# Patient Record
Sex: Male | Born: 1935 | Race: Asian | Hispanic: No | Marital: Married | Smoking: Never smoker
Health system: Southern US, Community
[De-identification: ages and names within clinical notes are randomized; demographics above are authoritative.]

## PROBLEM LIST (undated history)

## (undated) DIAGNOSIS — I2510 Atherosclerotic heart disease of native coronary artery without angina pectoris: Secondary | ICD-10-CM

## (undated) DIAGNOSIS — I499 Cardiac arrhythmia, unspecified: Secondary | ICD-10-CM

## (undated) DIAGNOSIS — I10 Essential (primary) hypertension: Secondary | ICD-10-CM

## (undated) DIAGNOSIS — G4730 Sleep apnea, unspecified: Secondary | ICD-10-CM

## (undated) DIAGNOSIS — I471 Supraventricular tachycardia, unspecified: Secondary | ICD-10-CM

## (undated) DIAGNOSIS — E7800 Pure hypercholesterolemia, unspecified: Secondary | ICD-10-CM

## (undated) DIAGNOSIS — K219 Gastro-esophageal reflux disease without esophagitis: Secondary | ICD-10-CM

## (undated) DIAGNOSIS — C61 Malignant neoplasm of prostate: Secondary | ICD-10-CM

## (undated) HISTORY — DX: Malignant neoplasm of prostate: C61

## (undated) HISTORY — DX: Atherosclerotic heart disease of native coronary artery without angina pectoris: I25.10

## (undated) HISTORY — PX: ANAL FISSURE REPAIR: SHX2312

## (undated) HISTORY — DX: Pure hypercholesterolemia, unspecified: E78.00

## (undated) HISTORY — PX: PROSTATECTOMY: SHX69

## (undated) HISTORY — DX: Essential (primary) hypertension: I10

---

## 1999-04-21 ENCOUNTER — Ambulatory Visit: Admission: RE | Admit: 1999-04-21 | Discharge: 1999-04-21 | Payer: Self-pay | Admitting: *Deleted

## 2002-04-23 ENCOUNTER — Ambulatory Visit (HOSPITAL_COMMUNITY): Admission: RE | Admit: 2002-04-23 | Discharge: 2002-04-23 | Payer: Self-pay | Admitting: Gastroenterology

## 2002-04-23 ENCOUNTER — Encounter: Payer: Self-pay | Admitting: Gastroenterology

## 2002-07-14 ENCOUNTER — Encounter (INDEPENDENT_AMBULATORY_CARE_PROVIDER_SITE_OTHER): Payer: Self-pay | Admitting: Specialist

## 2002-07-14 ENCOUNTER — Ambulatory Visit (HOSPITAL_COMMUNITY): Admission: RE | Admit: 2002-07-14 | Discharge: 2002-07-14 | Payer: Self-pay | Admitting: Gastroenterology

## 2003-03-03 ENCOUNTER — Encounter: Admission: RE | Admit: 2003-03-03 | Discharge: 2003-04-19 | Payer: Self-pay | Admitting: *Deleted

## 2009-09-17 HISTORY — PX: EYE SURGERY: SHX253

## 2009-11-07 ENCOUNTER — Encounter: Payer: Self-pay | Admitting: Internal Medicine

## 2009-11-18 ENCOUNTER — Encounter: Payer: Self-pay | Admitting: Internal Medicine

## 2009-12-12 ENCOUNTER — Ambulatory Visit: Payer: Self-pay | Admitting: Internal Medicine

## 2009-12-12 DIAGNOSIS — G4733 Obstructive sleep apnea (adult) (pediatric): Secondary | ICD-10-CM

## 2009-12-12 DIAGNOSIS — C61 Malignant neoplasm of prostate: Secondary | ICD-10-CM

## 2009-12-12 DIAGNOSIS — I471 Supraventricular tachycardia: Secondary | ICD-10-CM

## 2009-12-12 DIAGNOSIS — I10 Essential (primary) hypertension: Secondary | ICD-10-CM | POA: Insufficient documentation

## 2009-12-12 DIAGNOSIS — E7800 Pure hypercholesterolemia, unspecified: Secondary | ICD-10-CM | POA: Insufficient documentation

## 2010-04-11 ENCOUNTER — Encounter (INDEPENDENT_AMBULATORY_CARE_PROVIDER_SITE_OTHER): Payer: Self-pay | Admitting: *Deleted

## 2010-04-13 ENCOUNTER — Ambulatory Visit: Payer: Self-pay | Admitting: Internal Medicine

## 2010-04-13 DIAGNOSIS — R079 Chest pain, unspecified: Secondary | ICD-10-CM | POA: Insufficient documentation

## 2010-04-25 ENCOUNTER — Telehealth (INDEPENDENT_AMBULATORY_CARE_PROVIDER_SITE_OTHER): Payer: Self-pay | Admitting: *Deleted

## 2010-04-26 ENCOUNTER — Ambulatory Visit: Payer: Self-pay | Admitting: Cardiology

## 2010-04-26 ENCOUNTER — Encounter: Payer: Self-pay | Admitting: Cardiology

## 2010-04-26 ENCOUNTER — Ambulatory Visit: Payer: Self-pay

## 2010-04-26 ENCOUNTER — Encounter (HOSPITAL_COMMUNITY): Admission: RE | Admit: 2010-04-26 | Discharge: 2010-06-06 | Payer: Self-pay | Admitting: Internal Medicine

## 2010-05-02 ENCOUNTER — Telehealth: Payer: Self-pay | Admitting: Internal Medicine

## 2010-06-22 ENCOUNTER — Telehealth: Payer: Self-pay | Admitting: Internal Medicine

## 2010-08-17 ENCOUNTER — Encounter: Payer: Self-pay | Admitting: Internal Medicine

## 2010-10-19 NOTE — Progress Notes (Signed)
Summary: Nuclear Pre-Procedure  Phone Note Outgoing Call Call back at Home Phone [phone]   Call placed by: Stanton Kidney, EMT-P,  April 25, 2010 2:05 PM Action Taken: Phone Call Completed Summary of Call: Reviewed information on Myoview Information Sheet (see scanned document for further details).  Spoke with the Patient's wife.    Nuclear Med Background Indications for Stress Test: Evaluation for Ischemia     Symptoms: Chest Pain, Nausea, Palpitations, SOB    Nuclear Pre-Procedure Cardiac Risk Factors: Family History - CAD, Hypertension, Lipids Height (in): 68.5

## 2010-10-19 NOTE — Miscellaneous (Signed)
Summary: update med  Clinical Lists Changes  Medications: Changed medication from AMLODIPINE BESYLATE 5 MG TABS (AMLODIPINE BESYLATE) take one tablet once daily to AMLODIPINE BESYLATE 10 MG TABS (AMLODIPINE BESYLATE) Take one tablet by mouth daily

## 2010-10-19 NOTE — Consult Note (Signed)
Summary: [name]   Imported By: Marylou Mccoy 02/16/2010 16:04:31  _____________________________________________________________________  External Attachment:    Type:   Image     Comment:   External Document

## 2010-10-19 NOTE — Progress Notes (Signed)
Summary: refill request  Phone Note Refill Request Message from:  Patient on June 22, 2010 9:37 AM  Refills Requested: Medication #1:  AMLODIPINE BESYLATE 10 MG TABS 1/2 tablet once daily  Medication #2:  TOPROL XL 50 MG XR24H-TAB take one tablet once daily [facility]   Method Requested: Telephone to Pharmacy Initial call taken by: Glynda Jaeger,  June 22, 2010 9:37 AM  Follow-up for Phone Call       Follow-up by: Judithe Modest CMA,  June 22, 2010 11:18 AM    Prescriptions: TOPROL XL 50 MG XR24H-TAB (METOPROLOL SUCCINATE) take one tablet once daily  #30 x 3   Entered by:   Judithe Modest CMA   Authorized by:   Nathen May, MD, [facility]   Signed by:   Judithe Modest CMA on 06/22/2010   Method used:   Electronically to        [facility]  [phone]* (retail)       [address]       [place], [place]  [address]       Ph: [phone] or [phone]       Fax: [phone]   RxID:   [id number] AMLODIPINE BESYLATE 10 MG TABS (AMLODIPINE BESYLATE) 1/2 tablet once daily  #15 x 3   Entered by:   Judithe Modest CMA   Authorized by:   Nathen May, MD, [facility]   Signed by:   Judithe Modest CMA on 06/22/2010   Method used:   Electronically to        [facility]  [phone]* (retail)       [address]       [place], [place]  [address]       Ph: [phone] or [phone]       Fax: [phone]   RxID:   [id number]

## 2010-10-19 NOTE — Assessment & Plan Note (Signed)
Summary: nep/svt ablation? AVNRT/jml   Referring Provider:  Donato Schultz, MD Primary Provider:  Juluis Rainier, MD  CC:  new patient/question of SVT ablation.  AVNRT.  Pt referred by Dr. Dione Housekeeper.  History of Present Illness: The patient is seen at the request of Dr Anne Fu for recurrent symptomatic tachypalpiations.    He is a 77 year old retired [employer] fund[employer] who has a one to 2 year history of abrupt onset onset tachypalpitations. His measured heart rate with these range from 1:30 to 150 beats per minute there from positive a diuretic negative they're unassociated with chest pain shortness of breath or lightheadedness.  He notes no specific provocative agents.  Current Medications (verified): 1)  Lansoprazole 15 Mg Tbdp (Lansoprazole) .... Take One Tablet Once Daily 2)  Amlodipine Besylate 5 Mg Tabs (Amlodipine Besylate) .... Take One Tablet Once Daily 3)  Toprol Xl 50 Mg Xr24h-Tab (Metoprolol Succinate) .... Take One Tablet Once Daily 4)  Crestor 10 Mg Tabs (Rosuvastatin Calcium) .... Take One Tablet Once Daily 5)  Aspir-Low 81 Mg Tbec (Aspirin) .... Take One Tablet Once Daily 6)  Beta Carotene 16109 Unit Caps (Beta Carotene) .... Take One Capsule Daily 7)  B-100 Complex  Tabs (Vitamins-Lipotropics) .... Take One Tablet Once Daily 8)  Vitamin E 400 Unit Caps (Vitamin E) .... Once Daily 9)  Calcium-Vitamin D3 600-200 Mg-Unit Tabs (Calcium Carbonate-Vitamin D) .... Once Daily 10)  Zinc Gluconate 50 Mg Tabs (Zinc Gluconate) .... Once Daily 11)  Magnesium Oxide 400 Mg Tabs (Magnesium Oxide) .... Take One Tablet Once Daily 12)  Folic Acid 800 Mcg Tabs (Folic Acid) .... Once Daily 13)  L-Arginine 500 Mg Caps (Arginine) .... Once Daily 14)  Fish Oil Maximum Strength 1200 Mg Caps (Omega-3 Fatty Acids) .... 2 Capsules Daily 15)  Papaya Enzyme  Tabs (Digestive Enzymes) .... Once Daily 16)  Flax Seed Oil 1000 Mg Caps (Flaxseed (Linseed)) .... Once Daily 17)  Align  Caps (Probiotic Product) .... Once  Daily  Allergies (verified): 1)  ! Amoxicillin 2)  ! * Mycins 3)  ! Ace Inhibitors 4)  ! Lipitor (Atorvastatin) 5)  ! Ibuprofen 6)  ! Pcn  Past History:  Past Surgical History: Last updated: 12/12/2009 prostatectomy  Family History: Last updated: 12/12/2009 Negative FH of Diabetes, Hypertension, or Coronary Artery Disease  Social History: Last updated: 12/12/2009 Full Time Tobacco Use - No.  Alcohol Use - no Regular Exercise - yes married with 4 children and retired  Past Medical History: prostate cancer status post prostatectomy Palpitations Hypertension Hypercholesterolemia  Family History: Negative FH of Diabetes, Hypertension, or Coronary Artery Disease  Social History: Full Time Tobacco Use - No.  Alcohol Use - no Regular Exercise - yes married with 4 children and retired  Review of Systems       full review of systems was negative apart from a history of present illness and past medical history.   Vital Signs:  Patient profile:   77 year old male Height:      68.5 inches Weight:      161 pounds BMI:     24.21 Pulse rate:   55 / minute Pulse rhythm:   regular BP sitting:   164 / 84  (left arm) Cuff size:   regular  Vitals Entered By: Judithe Modest CMA (December 12, 2009 1:29 PM)  Physical Exam  General:  Well developed, well nourished, in no acute distress. Head:  normal HEENT except for dysconjugate gaze with left eye amblyopia Neck:  flat neck  veins supple neck brisk carotids Chest Wall:  without CVA tenderness Lungs:  clear to auscultation Heart:  regular rate and rhythm with a 2/6 murmur heard broadly across the precordium Abdomen:  soft with active bowel sounds without hepatomegaly or midline pulsation Msk:  normal Pulses:  pulses normal in all 4 extremities Extremities:  No clubbing or cyanosis. Neurologic:  gross and normal apart from the left eye issue Skin:  skin warm and dry Cervical Nodes:  no adenopathy Psych:   indication   EKG  Procedure date:  12/12/2009  Findings:      sinus rhythm at 55 Intervals 0.21/0.10/0.43 Axis -65 R. prime in lead V1 Q Waves in lead V1 and V2  Impression & Recommendations:  Problem # 1:  PALPITATIONS (ICD-785.1) Pt has junctional rhythm with varying rates. It is not clear as noted before whether there is antecedent triggering P-wave or not it was interpreted as junction rhythms or AV node reentry. I suspect it is the latter. We have discussed the potential treatment strategies including ongoing use of beta blockers although this may make it worse and catheter ablation. We discussed the potential benefits as well as potential risks including but not limited to death perforation and heart block. He understands these risks at this point he would like to continue doing what he is doing. I did reassure him that this does not represent a life-threatening rhythm. We'll see him in 3 months His updated medication list for this problem includes:    Amlodipine Besylate 5 Mg Tabs (Amlodipine besylate) .Marland Kitchen... Take one tablet once daily    Toprol Xl 50 Mg Xr24h-tab (Metoprolol succinate) .Marland Kitchen... Take one tablet once daily    Aspir-low 81 Mg Tbec (Aspirin) .Marland Kitchen... Take one tablet once daily  Orders: EKG w/ Interpretation (93000)  Problem # 2:  ESSENTIAL HYPERTENSION, BENIGN (ICD-401.1) his blood pressure remains elevated he will plan to increase his amlodipine from 5 mg to 10 mg a day. His updated medication list for this problem includes:    Amlodipine Besylate 5 Mg Tabs (Amlodipine besylate) .Marland Kitchen... Take one tablet once daily    Toprol Xl 50 Mg Xr24h-tab (Metoprolol succinate) .Marland Kitchen... Take one tablet once daily    Aspir-low 81 Mg Tbec (Aspirin) .Marland Kitchen... Take one tablet once daily  Patient Instructions: 1)  Your physician has recommended you make the following change in your medication: Increase Norvasc to 10mg  daily 2)  Your physician recommends that you schedule a follow-up appointment  in: 3-4 months 3)  You have been diagnosed with SVT (supraventricular tachycardia).  SVT is a rapid heartbeat that begins in the upper chambers of the heart. This is usually not a life threatening condition. Please see the handout/brochure given to you today for more information.

## 2010-10-19 NOTE — Assessment & Plan Note (Signed)
Summary: Cardiology Nuclear Testing  Nuclear Med Background Indications for Stress Test: Evaluation for Ischemia   History: GXT  History Comments: GXT 1 yr.ago: Ok per patient [facility] cardiology).  Symptoms: Chest Pain, Chest Pain with Exertion, Light-Headedness, Nausea, Palpitations, Rapid HR, SOB    Nuclear Pre-Procedure Cardiac Risk Factors: Family History - CAD, Hypertension, Lipids Caffeine/Decaff Intake: None NPO After: 8:00 PM Lungs: Clear IV 0.9% NS with Angio Cath: 20g     IV Site: (L) AC IV Started by: Irean Hong RN Chest Size (in) 40     Height (in): 68.5 Weight (lb): 155 BMI: 23.31 Tech Comments: Held toprol 24 hrs.  Nuclear Med Study 1 or 2 day study:  1 day     Stress Test Type:  Stress Reading MD:  Marca Ancona, MD     Referring MD:  Odessa Fleming Resting Radionuclide:  Technetium 86m Tetrofosmin     Resting Radionuclide Dose:  10.8 mCi  Stress Radionuclide:  Technetium 25m Tetrofosmin     Stress Radionuclide Dose:  33.0 mCi   Stress Protocol Exercise Time (min):  7:16 min     Max HR:  142 bpm     Predicted Max HR:  146 bpm  Max Systolic BP: 172 mm Hg     Percent Max HR:  97.26 %     METS: 8.9 Rate Pressure Product:  36644    Stress Test Technologist:  Irean Hong RN     Nuclear Technologist:  Harlow Asa CNMT  Rest Procedure  Myocardial perfusion imaging was performed at rest 45 minutes following the intravenous administration of Myoview Technetium 42m Tetrofosmin.  Stress Procedure  The patient exercised for 7 minutes and 16 seconds, RPE=15.  The patient stopped due to palpitations, DOE   and denied any chest pain.  There were no significant ST-T wave changes, frequent PJC's.  Myoview was injected at peak exercise and myocardial perfusion imaging was performed after a brief delay.  QPS Raw Data Images:  Normal; no motion artifact; normal heart/lung ratio. Stress Images:  There is normal uptake in all areas. Rest Images:  Normal homogeneous uptake  in all areas of the myocardium. Subtraction (SDS):  There is no evidence of scar or ischemia. Transient Ischemic Dilatation:  1.02  (Normal <1.22)  Lung/Heart Ratio:  .30  (Normal <0.45)  Quantitative Gated Spect Images QGS EDV:  83 ml QGS ESV:  19 ml QGS EF:  77 % QGS cine images:  Normal wall motion.    Overall Impression  Exercise Capacity: Good exercise capacity. BP Response: Normal blood pressure response. Clinical Symptoms: Mild dyspnea, calf tightness.  ECG Impression: No significant ST segment change suggestive of ischemia. Overall Impression: Normal stress nuclear study.  Appended Document: Cardiology Nuclear Testing Called patient with results.

## 2010-10-19 NOTE — Progress Notes (Signed)
Summary: test result  Phone Note Call from Patient Call back at Home Phone [phone]   Caller: Patient Reason for Call: Talk to Nurse, Lab or Test Results Initial call taken by: Lorne Skeens,  May 02, 2010 11:26 AM  Follow-up for Phone Call        Called patient with nuc med stress test results. Follow-up by: Suzan Garibaldi RN

## 2010-10-19 NOTE — Assessment & Plan Note (Signed)
Summary: 3 month rov.sl   Referring Provider:  Donato Schultz, MD Primary Provider:  Juluis Rainier, MD  CC:  3 month rov.  Pt states he is doing fairly well.  He may have one or two episodes in a month or more.  Marland Kitchen  History of Present Illness: M r  Bice IS SEEN IN FOLLOWUP FOR A JUNCTIONAL RHYTHM THAT WE THOUGHT WAS LIKELY AV NODE REENTRY.    His head number of episodes since we saw him 3 months ago. He said lasted 5 or 6 minutes or so. They're associated with a post event diuresis. They are more frightening than anything else.  He also describes a left shoulder discomfort with some radiation into his arm. It is associated chiefly with effort such as carrying. It does not occur while he is on the treadmill. It is unassociated with shortness of breath or nausea. It typically lasts 15-60 minutes afterwards and then resolves.  Cardiac risk factors are notable for hypertension dyslipidemia. His father said to have died of a heart attack in [place] although this was a post mortem diagnosis without autopsy  Current Medications (verified): 1)  Lansoprazole 15 Mg Tbdp (Lansoprazole) .... Take One Tablet Once Daily 2)  Amlodipine Besylate 10 Mg Tabs (Amlodipine Besylate) .... 1/2 Tablet Once Daily 3)  Toprol Xl 50 Mg Xr24h-Tab (Metoprolol Succinate) .... Take One Tablet Once Daily 4)  Crestor 10 Mg Tabs (Rosuvastatin Calcium) .... Take One Tablet Once Daily 5)  Aspir-Low 81 Mg Tbec (Aspirin) .... Take One Tablet Once Daily 6)  Beta Carotene 16109 Unit Caps (Beta Carotene) .... Take One Capsule Daily 7)  B-100 Complex  Tabs (Vitamins-Lipotropics) .... Take One Tablet Once Daily 8)  Vitamin E 400 Unit Caps (Vitamin E) .... Once Daily 9)  Calcium-Vitamin D3 600-200 Mg-Unit Tabs (Calcium Carbonate-Vitamin D) .... Once Daily 10)  Zinc Gluconate 50 Mg Tabs (Zinc Gluconate) .... Once Daily 11)  Magnesium Oxide 400 Mg Tabs (Magnesium Oxide) .... Take One Tablet Once Daily 12)  Folic Acid 800 Mcg Tabs (Folic  Acid) .... Once Daily 13)  L-Arginine 500 Mg Caps (Arginine) .... Once Daily 14)  Fish Oil Maximum Strength 1200 Mg Caps (Omega-3 Fatty Acids) .Marland Kitchen.. 1 Capsule Daily 15)  Papaya Enzyme  Tabs (Digestive Enzymes) .... Once Daily 16)  Flax Seed Oil 1000 Mg Caps (Flaxseed (Linseed)) .... Once Daily 17)  Align  Caps (Probiotic Product) .... Once Daily  Allergies (verified): 1)  ! Amoxicillin 2)  ! * Mycins 3)  ! Ace Inhibitors 4)  ! Lipitor (Atorvastatin) 5)  ! Ibuprofen 6)  ! Pcn  Past History:  Past Medical History: Last updated: 12/12/2009 prostate cancer status post prostatectomy Palpitations Hypertension Hypercholesterolemia  Past Surgical History: Last updated: 12/12/2009 prostatectomy  Family History: Last updated: 12/12/2009 Negative FH of Diabetes, Hypertension, or Coronary Artery Disease  Social History: Last updated: 12/12/2009 Full Time Tobacco Use - No.  Alcohol Use - no Regular Exercise - yes married with 4 children and retired  Vital Signs:  Patient profile:   77 year old male Height:      68.5 inches Weight:      158 pounds BMI:     23.76 Pulse rate:   61 / minute Pulse rhythm:   regular BP sitting:   150 / 75  (left arm) Cuff size:   regular  Vitals Entered By: Judithe Modest CMA (April 13, 2010 2:28 PM)  Physical Exam  General:  Well developed, well nourished, in no acute  distress. Head:  normal HEENT except for dysconjugate gaze with left eye amblyopia Eyes:    Neck:  flat neck veins supple neck brisk carotids Lungs:  clear to auscultation Heart:  regular rate and rhythm with a 2/6 murmur heard broadly across the precordium; mid to late peaking with a split S2 Abdomen:  soft with active bowel sounds without hepatomegaly or midline pulsation Msk:  normal Extremities:  No clubbing or cyanosis.3: No Neurologic:  gross and normal apart from the left eye issue   EKG  Procedure date:  04/13/2010  Findings:      sinus rhythm at 59 Intervals  0.21 5.10/0.42 Axis is -64 Poor R wave progression leads V1 and V2  Impression & Recommendations:  Problem # 1:  CHEST PAIN UNSPECIFIED (ICD-786.50) Mr. Theiss has atypical chest pain with concerning risk factors. 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 Will plan given his we'll symptoms to continue him on his current course. Because of the issue of junctional rhythm and sinus node dysfunction we have elected not to put him on a heart rate slowing calcium blocker His updated medication list for this problem includes:    Amlodipine Besylate 10 Mg Tabs (Amlodipine besylate) .Marland Kitchen... 1/2 tablet once daily    Toprol Xl 50 Mg Xr24h-tab (Metoprolol succinate) .Marland Kitchen... Take one tablet once daily    Aspir-low 81 Mg Tbec (Aspirin) .Marland Kitchen... Take one tablet once daily  Patient Instructions: 1)  Your physician has requested that you have an exercise stress myoview.  For further information please visit [email or web address]  Please follow instruction sheet, as given.

## 2011-02-02 NOTE — Op Note (Signed)
Dalton Cardenas, Dalton Cardenas                          ACCOUNT NO.:  [id number]   MEDICAL RECORD NO.:  [id number]                   PATIENT TYPE:  AMB   LOCATION:  ENDO                                 FACILITY:  [facility]   PHYSICIAN:  Anselmo Rod, M.D.               DATE OF BIRTH:  [birth year]   DATE OF PROCEDURE:  07/14/2002  DATE OF DISCHARGE:                                 OPERATIVE REPORT   DATE OF BIRTH:  01/06/1934   PROCEDURE PERFORMED:  Colonoscopy with snare polypectomy x 2.   ENDOSCOPIST:  Charna Elizabeth, M.D.   INSTRUMENT USED:  Olympus video colonoscope.   INDICATIONS FOR PROCEDURE:  A 77 year old male undergoing screening  colonoscopy.  Rule out colonic polyps, masses, etc.   PREPROCEDURE PREPARATION:  Informed consent was procured from the patient.  The patient was fasted for eight hours prior to the procedure and was  prepped with a bottle of magnesium citrate and a gallon of NuLytely the  night prior to the procedure.  The patient received 400 mg of Cipro IV for  MVP prophylaxis.   PREPROCEDURE PHYSICAL:   GENERAL:  The patient has stable vital signs.   NECK:  Supple.   CHEST:  Clear to auscultation.  S1, S2 regular.   ABDOMEN:  Soft with normal bowel sounds.   DESCRIPTION OF PROCEDURE:  The patient was placed in the left lateral  decubitus position and sedated with 70 mg of Demerol and 7 mg of Versed  intravenously.  Once the patient was adequately sedated and maintained on  low-flow oxygen and continuous cardiac monitoring, the Olympus video  colonoscope was advanced from the rectum to the cecum and terminal ileum  without difficulty.  The patient had an excellent prep.  A small sessile  polyp was snared from 55 cm and another small sessile polyp was snared from  10 cm.  These were both removed by a cold snare.  No other abnormalities  were noted up to the terminal ileum.  The appendiceal orifice and ileocecal  valve were clearly visualized and photographed.   Small internal hemorrhoids  were seen on retroflexion.   IMPRESSION:  1. Small nonbleeding internal hemorrhoid.  2. Small sessile polyp removed by cold snared from 10 cm, another small     polyp removed by cold snare from 55 cm.  3. Normal-appearing transverse colon, right colon, cecum, and terminal     ileum.   RECOMMENDATIONS:  1. Await pathology results.  2. Avoid all nonsteroidals, including aspirin for the next few weeks.  3. Liberal fluid and fiber intake.  4. Outpatient follow-up in the next two weeks or earlier if need be.  Anselmo Rod, M.D.    JNM/MEDQ  D:  07/14/2002  T:  07/14/2002  Job:  [id number]   cc:   Al Decant. Janey Greaser, M.D.  [address]  [place]  [place] [address]  Fax: [phone]

## 2011-03-23 ENCOUNTER — Encounter: Payer: Self-pay | Admitting: Internal Medicine

## 2011-07-03 ENCOUNTER — Ambulatory Visit: Payer: Self-pay | Admitting: Internal Medicine

## 2011-07-20 ENCOUNTER — Telehealth: Payer: Self-pay | Admitting: Internal Medicine

## 2011-07-20 NOTE — Telephone Encounter (Signed)
New message Pt called and he has not been feeling good. He said he is having a woozy feeling? Please call

## 2011-07-20 NOTE — Telephone Encounter (Signed)
I spoke with the patient. He states that he has been out of the country in [place] and that is why he missed his last office visit with Dr. Graciela Husbands. He states while he was there, he did have a cath that showed a 60% lesion to the left circumflex. He has proceeded to have feelings of being "whoozy," but he has not felt syncopal or pre-syncopal. He is being scheduled to followup with Dr. Graciela Husbands on 11/30 at 11:45am.

## 2011-08-17 ENCOUNTER — Ambulatory Visit: Payer: Self-pay | Admitting: Internal Medicine

## 2011-09-05 ENCOUNTER — Encounter: Payer: Self-pay | Admitting: Internal Medicine

## 2011-09-05 ENCOUNTER — Ambulatory Visit (INDEPENDENT_AMBULATORY_CARE_PROVIDER_SITE_OTHER): Payer: Medicare Other | Admitting: Internal Medicine

## 2011-09-05 VITALS — BP 166/82 | HR 69 | Ht 69.0 in | Wt 164.8 lb

## 2011-09-05 DIAGNOSIS — R079 Chest pain, unspecified: Secondary | ICD-10-CM

## 2011-09-05 DIAGNOSIS — E7800 Pure hypercholesterolemia, unspecified: Secondary | ICD-10-CM

## 2011-09-05 DIAGNOSIS — I2510 Atherosclerotic heart disease of native coronary artery without angina pectoris: Secondary | ICD-10-CM

## 2011-09-05 DIAGNOSIS — R002 Palpitations: Secondary | ICD-10-CM

## 2011-09-05 NOTE — Patient Instructions (Signed)
Your physician wants you to follow-up in: 6 months  You will receive a reminder letter in the mail two months in advance. If you don't receive a letter, please call our office to schedule the follow-up appointment.  Your physician recommends that you continue on your current medications as directed. Please refer to the Current Medication list given to you today.  

## 2011-09-05 NOTE — Progress Notes (Signed)
HPI  Dalton Cardenas is a 77 y.o. male Seen after a hiatus of a couple of years. He was in [place] last December. He had recurrent palpitations. Because of his age and the electrophysiologist suggested that he had a catheterization. This showed an eccentric proximal circumflex lesion. It was elected to treat it medically. It was also elected to treat his tachycardia medically. He underwent a Holter monitor that demonstrated resting bradycardia, a tachycardia that is consistent with AV node reentry, and another tachycardia that is consistent with multifocal atrial tachycardia. He was treated with a medication called ABANA Which is a cholesterol-lowering drug newlywed site for which I could find was afebrile and organization. In any case it had been associated with elimination of his tachycardia. He feels terrific and he is going back to [place]  Past Medical History  Diagnosis Date  . Prostate cancer     status post prostatectomy  . Palpitations   . Hypertension   . Hypercholesterolemia     Past Surgical History  Procedure Date  . Prostatectomy     Current Outpatient Prescriptions  Medication Sig Dispense Refill  . amLODipine (NORVASC) 10 MG tablet Take 10 mg by mouth daily.        . Arginine 500 MG CAPS Take 1 capsule by mouth daily.        Marland Kitchen aspirin 81 MG tablet Take 81 mg by mouth 2 (two) times daily.       . beta carotene 16109 UNIT capsule Take 25,000 Units by mouth daily.        . Calcium Carbonate-Vitamin D (CALCIUM-VITAMIN D3) 600-200 MG-UNIT TABS Take 1 tablet by mouth daily.        . Digestive Enzymes (PAPAYA ENZYME) TABS Take 1 tablet by mouth daily.        . Flaxseed, Linseed, (FLAX SEED OIL) 1000 MG CAPS Take 1 capsule by mouth daily.        . folic acid (FOLVITE) 800 MCG tablet Take 800 mcg by mouth daily.        . lansoprazole (PREVACID) 15 MG capsule Take 15 mg by mouth daily.        . magnesium oxide (MAG-OX) 400 MG tablet Take 400 mg by mouth daily.        . metoprolol  (TOPROL-XL) 50 MG 24 hr tablet Take 75 mg by mouth daily. 1/2 tab in the morning and whole tablet in the evening       . Omega-3 Fatty Acids (FISH OIL MAXIMUM STRENGTH) 1200 MG CAPS Take 1 capsule by mouth daily.        . Probiotic Product (ALIGN PO) Take by mouth daily.        . rosuvastatin (CRESTOR) 10 MG tablet Take 10 mg by mouth daily.        . Thiamine HCl (VITAMIN B-1) 100 MG tablet Take 100 mg by mouth daily.        . vitamin E 400 UNIT capsule Take 400 Units by mouth daily.        Marland Kitchen zinc gluconate 50 MG tablet Take 50 mg by mouth daily.          Allergies  Allergen Reactions  . Ace Inhibitors   . Amoxicillin   . Atorvastatin   . Ibuprofen   . Penicillins     Review of Systems negative except from HPI and PMH  Physical Exam Well developed and well nourished in no acute distress HENT normal E scleral and icterus clear;  esotropia  Neck Supple JVP flat; carotids brisk and full Clear to ausculation Regular rate and rhythm, 2/6 systolic murmur along the left upper sternal border going to the right upper border; S2 is well preserved Soft with active bowel sounds No clubbing cyanosis none Edema Alert and oriented, grossly normal motor and sensory function Skin Warm and Dry   Echo was reviewed from [place]. Showed aortic sclerosis Assessment and  Plan

## 2011-09-05 NOTE — Assessment & Plan Note (Signed)
I recommended a CT follow up with Dr. Zachery Dauer concerning the possible addition of resin therapy

## 2011-09-05 NOTE — Assessment & Plan Note (Signed)
The patient has recurrent supraventricular tachycardia palpitations. Medical therapy has worked. 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 This anticholesterol medication calledABANA seems to be having a therapeutic effect. We'll continue him on his current regime.

## 2012-07-22 ENCOUNTER — Emergency Department (HOSPITAL_COMMUNITY)
Admission: EM | Admit: 2012-07-22 | Discharge: 2012-07-22 | Disposition: A | Discharge status: Home / Self Care | Payer: Medicare Other | Attending: Emergency Medicine | Admitting: Emergency Medicine

## 2012-07-22 ENCOUNTER — Emergency Department (HOSPITAL_COMMUNITY): Payer: Medicare Other

## 2012-07-22 ENCOUNTER — Encounter (HOSPITAL_COMMUNITY): Payer: Self-pay | Admitting: *Deleted

## 2012-07-22 DIAGNOSIS — Z79899 Other long term (current) drug therapy: Secondary | ICD-10-CM | POA: Insufficient documentation

## 2012-07-22 DIAGNOSIS — I2510 Atherosclerotic heart disease of native coronary artery without angina pectoris: Secondary | ICD-10-CM | POA: Insufficient documentation

## 2012-07-22 DIAGNOSIS — E876 Hypokalemia: Secondary | ICD-10-CM

## 2012-07-22 DIAGNOSIS — Z87898 Personal history of other specified conditions: Secondary | ICD-10-CM | POA: Insufficient documentation

## 2012-07-22 DIAGNOSIS — I10 Essential (primary) hypertension: Secondary | ICD-10-CM | POA: Insufficient documentation

## 2012-07-22 DIAGNOSIS — E7800 Pure hypercholesterolemia, unspecified: Secondary | ICD-10-CM | POA: Insufficient documentation

## 2012-07-22 DIAGNOSIS — Z7982 Long term (current) use of aspirin: Secondary | ICD-10-CM | POA: Insufficient documentation

## 2012-07-22 DIAGNOSIS — R55 Syncope and collapse: Secondary | ICD-10-CM | POA: Insufficient documentation

## 2012-07-22 DIAGNOSIS — Z8546 Personal history of malignant neoplasm of prostate: Secondary | ICD-10-CM | POA: Insufficient documentation

## 2012-07-22 DIAGNOSIS — I4891 Unspecified atrial fibrillation: Secondary | ICD-10-CM | POA: Insufficient documentation

## 2012-07-22 HISTORY — DX: Supraventricular tachycardia: I47.1

## 2012-07-22 HISTORY — DX: Supraventricular tachycardia, unspecified: I47.10

## 2012-07-22 LAB — CBC WITH DIFFERENTIAL/PLATELET
Basophils Absolute: 0.1 10*3/uL (ref 0.0–0.1)
Basophils Relative: 1 % (ref 0–1)
Eosinophils Absolute: 0.2 10*3/uL (ref 0.0–0.7)
HCT: 40.7 % (ref 39.0–52.0)
MCH: 30.2 pg (ref 26.0–34.0)
MCHC: 35.1 g/dL (ref 30.0–36.0)
Monocytes Absolute: 0.9 10*3/uL (ref 0.1–1.0)
Neutro Abs: 7.5 10*3/uL (ref 1.7–7.7)
Neutrophils Relative %: 73 % (ref 43–77)
RDW: 12.4 % (ref 11.5–15.5)

## 2012-07-22 LAB — BASIC METABOLIC PANEL
BUN: 11 mg/dL (ref 6–23)
Chloride: 98 mEq/L (ref 96–112)
Creatinine, Ser: 0.93 mg/dL (ref 0.50–1.35)
GFR calc Af Amer: >90 mL/min (ref >90)
GFR calc non Af Amer: 80 mL/min — ABNORMAL LOW (ref >90)

## 2012-07-22 LAB — URINALYSIS, ROUTINE W REFLEX MICROSCOPIC
Bilirubin Urine: NEGATIVE
Ketones, ur: NEGATIVE mg/dL
Leukocytes, UA: NEGATIVE
Nitrite: NEGATIVE
Protein, ur: NEGATIVE mg/dL
Urobilinogen, UA: 0.2 mg/dL (ref 0.0–1.0)
pH: 7 (ref 5.0–8.0)

## 2012-07-22 MED ORDER — POTASSIUM CHLORIDE CRYS ER 20 MEQ PO TBCR
40.0000 meq | EXTENDED_RELEASE_TABLET | Freq: Once | ORAL | Status: AC
  Administered 2012-07-22: 40 meq via ORAL
  Filled 2012-07-22: qty 2

## 2012-07-22 NOTE — ED Provider Notes (Signed)
History     CSN: [id number]  Arrival date & time 07/22/12  1026   First MD Initiated Contact with Patient 07/22/12 1027      Chief Complaint  Patient presents with  . Loss of Consciousness    (Consider location/radiation/quality/duration/timing/severity/associated sxs/prior treatment) HPI Pt with history of atrial fibrillation and tachycardia was at the gym doing typical cardio workout this AM when he began to feel lightheaded and dizzy. He was unconscious for a brief period. Regained consciousness spontaneously, no reported seizure activity. He denies any antecedent CP, SOB, nausea or vomiting. He admits to not eating breakfast prior to his workout but states this is typical for him. He is feeling better now, but still a little bit nauseated and generally weak.   Past Medical History  Diagnosis Date  . Prostate cancer     status post prostatectomy  . Palpitations   . Hypertension   . Hypercholesterolemia   . Coronary artery disease     60% circumflex-catheter [place] 2011  . Atrial fibrillation     Past Surgical History  Procedure Date  . Prostatectomy     Family History  Problem Relation Age of Onset  . Diabetes Neg Hx   . Hypertension Neg Hx   . Coronary artery disease Neg Hx     History  Substance Use Topics  . Smoking status: Never Smoker   . Smokeless tobacco: Never Used  . Alcohol Use: No      Review of Systems All other systems reviewed and are negative except as noted in HPI.   Allergies  Ace inhibitors; Amoxicillin; Atorvastatin; Azithromycin; Ibuprofen; and Penicillins  Home Medications   Current Outpatient Rx  Name  Route  Sig  Dispense  Refill  . AMLODIPINE BESYLATE 5 MG PO TABS   Oral   Take 5 mg by mouth daily.         . ASPIRIN 81 MG PO TABS   Oral   Take 81 mg by mouth daily.          . B COMPLEX PO TABS   Oral   Take 1 tablet by mouth daily.         Marland Kitchen BETA CAROTENE 72536 UNITS PO CAPS   Oral   Take 25,000 Units by mouth  daily.           Marland Kitchen CALCIUM-VITAMIN D3 600-200 MG-UNIT PO TABS   Oral   Take 1 tablet by mouth daily.           Marland Kitchen VITAMIN D 1000 UNITS PO TABS   Oral   Take 1,000 Units by mouth daily.         . CO Q-10 PO   Oral   Take 1 capsule by mouth daily.         . CYANOCOBALAMIN 1000 MCG PO TABS   Oral   Take 1,000 mcg by mouth daily.         Marland Kitchen FOLIC ACID 800 MCG PO TABS   Oral   Take 800 mcg by mouth daily.           Marland Kitchen MAGNESIUM OXIDE 400 MG PO TABS   Oral   Take 400 mg by mouth daily.           Marland Kitchen METOPROLOL SUCCINATE ER 50 MG PO TB24   Oral   Take 25-50 mg by mouth daily. Take 50mg  in the morning and 25mg  in the evening         .  PANTOPRAZOLE SODIUM 40 MG PO TBEC   Oral   Take 40 mg by mouth daily.         Marland Kitchen ALIGN PO   Oral   Take 1 capsule by mouth daily.          Marland Kitchen ROSUVASTATIN CALCIUM 10 MG PO TABS   Oral   Take 10 mg by mouth daily.           Marland Kitchen TAMSULOSIN HCL 0.4 MG PO CAPS   Oral   Take 0.4 mg by mouth daily after supper.         Marland Kitchen VITAMIN B-1 100 MG PO TABS   Oral   Take 100 mg by mouth daily.           Marland Kitchen VITAMIN C 500 MG PO TABS   Oral   Take 500 mg by mouth daily.         Marland Kitchen ZINC GLUCONATE 50 MG PO TABS   Oral   Take 50 mg by mouth every other day.            BP 129/62  Pulse 69  Temp 97.7 F (36.5 C) (Oral)  Resp 16  SpO2 99%  Physical Exam  Nursing note and vitals reviewed. Constitutional: He is oriented to person, place, and time. He appears well-developed and well-nourished.  HENT:  Head: Normocephalic and atraumatic.  Eyes: EOM are normal. Pupils are equal, round, and reactive to light.  Neck: Normal range of motion. Neck supple.  Cardiovascular: Normal rate, normal heart sounds and intact distal pulses.   Pulmonary/Chest: Effort normal and breath sounds normal.  Abdominal: Bowel sounds are normal. He exhibits no distension. There is no tenderness.  Musculoskeletal: Normal range of motion. He exhibits no  edema and no tenderness.  Neurological: He is alert and oriented to person, place, and time. He has normal strength. No cranial nerve deficit or sensory deficit.  Skin: Skin is warm and dry. No rash noted.  Psychiatric: He has a normal mood and affect.    ED Course  Procedures (including critical care time)   Labs Reviewed  CBC WITH DIFFERENTIAL  BASIC METABOLIC PANEL  URINALYSIS, ROUTINE W REFLEX MICROSCOPIC  TROPONIN I   Dg Chest 2 View  07/22/2012  *RADIOLOGY REPORT*  Clinical Data: Syncopal episode, history of hypertension  CHEST - 2 VIEW  Comparison: None.  Findings: The heart and pulmonary vascularity are within normal limits.  Lungs are clear bilaterally.  No acute bony abnormality is seen.  IMPRESSION: No acute abnormality noted.   Original Report Authenticated By: Alcide Clever, M.D.    Ct Head Wo Contrast  07/22/2012  *RADIOLOGY REPORT*  Clinical Data: Dizziness with near syncope  CT HEAD WITHOUT CONTRAST  Technique:  Contiguous axial images were obtained from the base of the skull through the vertex without contrast. Study was obtained within 24 hours of patient arrival at the emergency department.  Comparison: None.  Findings: There is mild diffuse atrophy.  There is no mass, hemorrhage, extra-axial fluid collection, midline shift.  There is minimal small vessel disease in the centra semiovale bilaterally. Gray-white compartments are otherwise normal.  There is no appreciable acute infarct.  Bony calvarium appears intact.  The mastoid air cells are clear.  IMPRESSION: Mild atrophy with minimal small vessel disease.  Study otherwise unremarkable.   Original Report Authenticated By: Bretta Bang, M.D.      No diagnosis found.    MDM   Date: 07/22/2012  Rate: 72  Rhythm: normal sinus rhythm  QRS Axis: normal  Intervals: PR prolonged  ST/T Wave abnormalities: normal  Conduction Disutrbances:first-degree A-V block  and left anterior fascicular block  Narrative  Interpretation:   Old EKG Reviewed: none available  ED eval unremarkable, pt does not want admission, Vivian to see patient in the ED to arrange followup.        Brysyn Brandenberger B. Bernette Mayers, MD 07/22/12 1610

## 2012-07-22 NOTE — ED Notes (Signed)
Per EMS pt from gym- cardio workout, instructor reports pt felt lightheaded, dizzy. Unsure if posititive LOC- near syncopal episode. Denies pain. IV 18 L Hand.Hx of Afib. 1st degree block on EMS EKG. VSS. BP 100/50 on EMS arrival. BP now 140/67.

## 2012-07-22 NOTE — ED Provider Notes (Signed)
Medical screening examination/treatment/procedure(s) were conducted as a shared visit with non-physician practitioner(s) and myself.  I personally evaluated the patient during the encounter   Chey Cho B. Bernette Mayers, MD 07/22/12 1701

## 2012-07-22 NOTE — ED Provider Notes (Signed)
2:01 PM Filed Vitals:   07/22/12 1213 07/22/12 1214 07/22/12 1230 07/22/12 1345  BP: 109/45 121/63 129/55 148/69  Pulse: 62 76 65 69  Temp:      TempSrc:      Resp:    15  SpO2:   99% 100%     Assumed care of the patient in CDU.  Patient had a syncopal event today at the gym during his cardio workout.    Patient has refused admission for workup.  Patient is here in CDU awaiting cardiology consult. HX afib.  CV: RRR, No M/R/G, Peripheral pulses intact. No peripheral edema. Lungs: CTAB Abd: Soft, Non tender, non distended    3:51 PM Cardiology has cleared patient for d/c with toprol and potassium  Arthor Captain, PA-C 07/22/12 1600

## 2012-07-22 NOTE — Consult Note (Signed)
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 He is very active and exercises several times a week with his wife. He recently started tamsulosin for prostate issues. Today just before the cooldown phase of his workout, he was reaching up high then bending down to touch his toes and began to feel queasy and lightheaded. He sat down in a chair and shut his eyes. The next thing he knew, he found himself being helped onto the floor. It is unclear if he completely lost consciousness - the instructor told his wife that he would not make eye contact and instructed her to go call 911.  No injury, diaphoresis, CP, palpitations, vertigo, b/b incontinence, or seizure activity. He came to right away and was laughing and joking per his wife. He was able to walk to the stretcher without gait abnormality. He states he otherwise wouldn't have come if the instructor hadn't insisted. He currently feels well without complaint. Orthostatic vital signs were positive for a drop in BP from lying->sitting (58 & 127/60 --> 62 & 109/45 --> 76 & 121/63) but recovery upon standing.  He does say he was recently thinking about requesting a follow-up appt with Dr. Graciela Husbands because he's been experiencing more tachy-palpitations than usual. They are similar to his prior palpitations but have been occurring about once per day, lasting a few minutes at a time, spontaneously initiating and terminating. In the past they would last up to 45 minutes but are only short bursts now. No syncope, CP,  lightheadedness or SOB with these and he did not experience these at the time of his spell. Troponin neg x 1, CXR: no acute abnormality, CT head: mild atrophy but otherwise nonacute. He did have a brief run of SVT while in the ED, regular, ?AVNRT which resolved without intervention. He is not tachypnic or hypoxic. VSS. He currently feels at baseline.  Past Medical History  Diagnosis Date  . Prostate cancer     status post prostatectomy  . Hypertension   . Hypercholesterolemia   . Coronary artery disease     60-70% circumflex (cath in [place] 08/2010).  . SVT (supraventricular tachycardia)     a. Holter monitoring 2011 - AV node reentry, multifocal atrial tachycardia, and resting bradycardia. b. previously tx with Abana in [place].     Most Recent Cardiac Studies: Nuclear Stress Test 04/2010 Overall Impression  Exercise Capacity: Good exercise capacity.  BP Response: Normal blood pressure response.  Clinical Symptoms: Mild dyspnea, calf tightness.  ECG Impression: No significant ST segment change suggestive of ischemia.  Overall Impression: Normal stress nuclear study QGS EF: 77 %  Cardiac Cath 08/2010: - 60-70% LCx lesion by cath in [place] (see Epic Scan)   Surgical History:  Past Surgical History  Procedure Date  . Prostatectomy      Home Meds: Prior to Admission medications   Medication Sig Start Date End Date Taking? Authorizing Provider  amLODipine (NORVASC) 5 MG tablet Take 5 mg by mouth daily.   Yes Historical Provider, MD  aspirin 81 MG tablet Take 81 mg by mouth  daily.    Yes Historical Provider, MD  b complex vitamins tablet Take 1 tablet by mouth daily.   Yes Historical Provider, MD  beta carotene 16109 UNIT capsule Take 25,000 Units by mouth daily.     Yes Historical Provider, MD  Calcium Carbonate-Vitamin D (CALCIUM-VITAMIN D3) 600-200 MG-UNIT TABS Take 1 tablet by mouth daily.     Yes Historical Provider, MD  cholecalciferol (VITAMIN D) 1000 UNITS tablet Take 1,000  Units by mouth daily.   Yes Historical Provider, MD  Coenzyme Q10 (CO Q-10 PO) Take 1 capsule by mouth daily.   Yes Historical Provider, MD  cyanocobalamin 1000 MCG tablet Take 1,000 mcg by mouth daily.   Yes Historical Provider, MD  folic acid (FOLVITE) 800 MCG tablet Take 800 mcg by mouth daily.     Yes Historical Provider, MD  magnesium oxide (MAG-OX) 400 MG tablet Take 400 mg by mouth daily.     Yes Historical Provider, MD  metoprolol (TOPROL-XL) 50 MG 24 hr tablet Take 25-50 mg by mouth daily. Take 50mg  in the morning and 25mg  in the evening   Yes Historical Provider, MD  pantoprazole (PROTONIX) 40 MG tablet Take 40 mg by mouth daily.   Yes Historical Provider, MD  PRESCRIPTION MEDICATION Abana - prescribed in [place]. 2 pills by mouth in the morning and 1 pill in the evening.   Yes Historical Provider, MD  Probiotic Product (ALIGN PO) Take 1 capsule by mouth daily.    Yes Historical Provider, MD  rosuvastatin (CRESTOR) 10 MG tablet Take 10 mg by mouth daily.     Yes Historical Provider, MD  Tamsulosin HCl (FLOMAX) 0.4 MG CAPS Take 0.4 mg by mouth daily after supper.   Yes Historical Provider, MD  Thiamine HCl (VITAMIN B-1) 100 MG tablet Take 100 mg by mouth daily.     Yes Historical Provider, MD  vitamin C (ASCORBIC ACID) 500 MG tablet Take 500 mg by mouth daily.   Yes Historical Provider, MD  zinc gluconate 50 MG tablet Take 50 mg by mouth every other day.    Yes Historical Provider, MD     Allergies:  Allergies  Allergen Reactions  . Ace Inhibitors   . Amoxicillin   . Atorvastatin   . Azithromycin   . Ibuprofen   . Penicillins     History   Social History  . Marital Status: Married    Spouse Name: N/A    Number of Children: 4  . Years of Education: N/A   Occupational History  . Retired    Social History Main Topics  . Smoking status: Never Smoker   . Smokeless tobacco: Never Used  . Alcohol Use: No  . Drug Use: No  . Sexually Active: Not on file   Other Topics  Concern  . Not on file   Social History Narrative   Regular exercise: Yes     Family History  Problem Relation Age of Onset  . Diabetes Neg Hx   . Hypertension Neg Hx   . Coronary artery disease Neg Hx     Review of Systems: General: negative for chills, fever, night sweats or weight changes.  Cardiovascular: see above. No orthopnea, PND, LEE  Dermatological: negative for rash Respiratory: negative for cough or wheezing Urologic: negative for hematuria Abdominal: negative for nausea, vomiting, diarrhea, bright red blood per rectum, melena, or hematemesis. No recent GI illnesses or losses Neurologic: negative for visual changes. See above All other systems reviewed and are otherwise negative  except as noted above.  Labs:   Lab Results  Component Value Date   WBC 10.3 07/22/2012   HGB 14.3 07/22/2012   HCT 40.7 07/22/2012   MCV 85.9 07/22/2012   PLT 213 07/22/2012     Lab 07/22/12 1134  NA 136  K 3.4*  CL 98  CO2 28  BUN 11  CREATININE 0.93  CALCIUM 9.4  PROT --  BILITOT --  ALKPHOS --  ALT --  AST --  GLUCOSE 120*    Basename 07/22/12 1134  CKTOTAL --  CKMB --  TROPONINI <0.30   Radiology/Studies:  Dg Chest 2 View11/01/2012  *RADIOLOGY REPORT*  Clinical Data: Syncopal episode, history of hypertension  CHEST - 2 VIEW  Comparison: None.  Findings: The heart and pulmonary vascularity are within normal limits.  Lungs are clear bilaterally.  No acute bony abnormality is seen.  IMPRESSION: No acute abnormality noted.   Original Report Authenticated By: Alcide Clever, Cardenas.D.    Ct Head Wo Contrast11/01/2012  *RADIOLOGY REPORT*  Clinical Data: Dizziness with near syncope  CT HEAD WITHOUT CONTRAST  Technique:  Contiguous axial images were obtained from the base of the skull through the vertex without contrast. Study was obtained within 24 hours of patient arrival at the emergency department.  Comparison: None.  Findings: There is mild diffuse atrophy.  There is no mass,  hemorrhage, extra-axial fluid collection, midline shift.  There is minimal small vessel disease in the centra semiovale bilaterally. Gray-white compartments are otherwise normal.  There is no appreciable acute infarct.  Bony calvarium appears intact.  The mastoid air cells are clear.  IMPRESSION: Mild atrophy with minimal small vessel disease.  Study otherwise unremarkable.   Original Report Authenticated By: Bretta Bang, Cardenas.D.      EKG: NSR 72bpm, 1st degree AVB, LAFB. nonspecific ST-T changes, 2 PACs noted  Physical Exam: Blood pressure 128/58, pulse 59, temperature 97.5 F (36.4 C), temperature source Oral, resp. rate 13, SpO2 100.00%. General: Well developed, well nourished Dalton Cardenas in no acute distress. Head: Normocephalic, atraumatic, sclera non-icteric, no xanthomas, nares are without discharge.  Neck: Negative for carotid bruits. JVD not elevated. Lungs: Clear bilaterally to auscultation without wheezes, rales, or rhonchi. Breathing is unlabored. Heart: RRR with S1 S2. Soft systolic murmur RUSB & apex. No rubs or gallops appreciated. Abdomen: Soft, non-tender, non-distended with normoactive bowel sounds. No hepatomegaly. No rebound/guarding. No obvious abdominal masses. Msk:  Strength and tone appear normal for age. Extremities: No clubbing or cyanosis. No edema.  Distal pedal pulses are 2+ and equal bilaterally. Neuro: Alert and oriented X 3. No focal deficit. No facial asymmetry. Moves all extremities spontaneously. Psych:  Responds to questions appropriately with a normal affect.    ASSESSMENT AND PLAN:   1. Possible syncopal episode, likely related to orthostasis 2. Systolic murmur 3. H/o SVT (AVNRT/MAT) with recent increase in palpitations 4. CAD with 60-70% LCx 2011 without obvious anginal symptoms 5. Hypokalemia, given in ED 6. Prostate CA/hypertrophy, recently started on flomax  Dalton Cardenas's symptoms sound somewhat orthostatic in nature from an abrupt position  change. He was recently started on Flomax which may have contributed. No acute objective evidence of ischemia and no clear-cut relationship to prior history of arrhythmias. However, he has noted gradual recent independent increase in his usual tachypalpitations. He is stable to discharge home from the ED - will arrange 2D echocardiogram and 48hr Holter. Start KCl po daily and increase Toprol to 50mg  BID (rx's given). Otherwise continue  home medications. Will have him f/u with Dr. Graciela Husbands to determine further management of his arrhythmias. He was given return precautions.  Echo: tomorrow at 9:30am Holter - our office will call F/u Graciela Husbands 08/06/12 at 4:15pm  Signed, Ronie Spies PA-C 07/22/2012, 2:18 PM I have taken a history, reviewed medications, allergies, PMH, SH, FH, and reviewed ROS and examined the patient.  I agree with the assessment and plan. SVT and orthostatic hypotension contributing causes. See plan above as discussed with [facility], patient and wife. Close followup with Graciela Husbands.  Yoseline Andersson C. Daleen Squibb, MD, [facility] Pager:  [phone]

## 2012-07-23 ENCOUNTER — Encounter: Payer: Self-pay | Admitting: Internal Medicine

## 2012-07-23 ENCOUNTER — Other Ambulatory Visit (HOSPITAL_COMMUNITY): Payer: Self-pay | Admitting: Cardiology

## 2012-07-23 ENCOUNTER — Ambulatory Visit (HOSPITAL_COMMUNITY): Payer: Medicare Other | Attending: Cardiovascular Disease | Admitting: Radiology

## 2012-07-23 ENCOUNTER — Encounter (INDEPENDENT_AMBULATORY_CARE_PROVIDER_SITE_OTHER): Payer: Medicare Other

## 2012-07-23 DIAGNOSIS — I080 Rheumatic disorders of both mitral and aortic valves: Secondary | ICD-10-CM | POA: Insufficient documentation

## 2012-07-23 DIAGNOSIS — I10 Essential (primary) hypertension: Secondary | ICD-10-CM | POA: Insufficient documentation

## 2012-07-23 DIAGNOSIS — R55 Syncope and collapse: Secondary | ICD-10-CM

## 2012-07-23 DIAGNOSIS — I379 Nonrheumatic pulmonary valve disorder, unspecified: Secondary | ICD-10-CM | POA: Insufficient documentation

## 2012-07-23 DIAGNOSIS — I471 Supraventricular tachycardia: Secondary | ICD-10-CM

## 2012-07-23 DIAGNOSIS — I319 Disease of pericardium, unspecified: Secondary | ICD-10-CM | POA: Insufficient documentation

## 2012-07-23 DIAGNOSIS — I369 Nonrheumatic tricuspid valve disorder, unspecified: Secondary | ICD-10-CM | POA: Insufficient documentation

## 2012-07-23 NOTE — Progress Notes (Signed)
Echocardiogram performed.  

## 2012-07-24 ENCOUNTER — Telehealth: Payer: Self-pay | Admitting: Internal Medicine

## 2012-07-24 NOTE — Telephone Encounter (Signed)
New problem:  Test results - echo .is it O.K for him to leave town this weekend.

## 2012-07-24 NOTE — Telephone Encounter (Signed)
Per Dr. Daleen Squibb, ok for pt to go out of town.

## 2012-08-06 ENCOUNTER — Ambulatory Visit (INDEPENDENT_AMBULATORY_CARE_PROVIDER_SITE_OTHER): Payer: Medicare Other | Admitting: Internal Medicine

## 2012-08-06 ENCOUNTER — Encounter: Payer: Self-pay | Admitting: Internal Medicine

## 2012-08-06 VITALS — BP 160/78 | HR 75 | Ht 70.8 in | Wt 160.0 lb

## 2012-08-06 DIAGNOSIS — I498 Other specified cardiac arrhythmias: Secondary | ICD-10-CM

## 2012-08-06 DIAGNOSIS — I471 Supraventricular tachycardia, unspecified: Secondary | ICD-10-CM

## 2012-08-06 DIAGNOSIS — R55 Syncope and collapse: Secondary | ICD-10-CM

## 2012-08-06 NOTE — Assessment & Plan Note (Signed)
Holter monitoring confirms the presence of what is almost certainly AV node reentry. Agree with increased beta blockers and otherwise as above. I suggested that he consider catheter ablation given the potential association of syncope with his SVT

## 2012-08-06 NOTE — Assessment & Plan Note (Signed)
Blood pressure is elevated today. He also has edema. I wonder whether the latter is related to his amlodipine. As he is getting ready to go to [place], I do not think a change in medications now he says that it would probably, in the event that he was staying, put him on ACE inhibitor with a diuretic.

## 2012-08-06 NOTE — Patient Instructions (Signed)
Your physician has requested that you have an exercise stress myoview. For further information please visit [email or web address]. Please follow instruction sheet, as given.   

## 2012-08-06 NOTE — Assessment & Plan Note (Signed)
I am not sure of the mechanism of the syncope. Electrocardiogram demonstrates some conduction abnormalities with fascicular block. He has known coronary artery disease but without evidence of prior MI left ventricular dysfunction. 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 He has known SVT and this could serve as a trigger in the context of exercise of vasomotor episode. With his history of coronary artery disease, though, and a bout  [place]ner trip to [place], I think it is worth trying to exclude ischemia. His beta blockers have been increased.

## 2012-08-06 NOTE — Progress Notes (Signed)
Patient Care Team: Gaye Alken, MD as PCP - General (Family Medicine)   HPI  Dalton Cardenas is a 78 y.o. male Seen in followup for palpitations identified on Holter monitoring in [place]. 2 substrate for identified consistent with AV nodal reentry and multifocal atrial tachycardia. And 80 cholesterol medication prescribed in [place] at a therapeutic effect on his palpitations.  He is referred back from the emergency room following an episode of syncope. This occurred while he was working out. He was in the cool down phase doing bending at the waist. He began to feel nauseated and queasy. He sat down. Apparently he was able to retain postural tone at the same time that he was non-conversational and non engaging visually. He was helped to the ground; he has no recollection of that. Apparently he was unresponsive for about 30 seconds. There was residual fatigue.  He recalls a remote syncope. He does not have significant orthostatic intolerance.  He also describes tingling in his hands bilaterally when he awakens in the morning  Past Medical History  Diagnosis Date  . Prostate cancer     status post prostatectomy  . Hypertension   . Hypercholesterolemia   . Coronary artery disease     60-70% circumflex (cath in [place] 08/2010).  . SVT (supraventricular tachycardia)     a. Holter monitoring 2011 - AV node reentry, multifocal atrial tachycardia, and resting bradycardia. b. previously tx with Abana in [place].    Past Surgical History  Procedure Date  . Prostatectomy     Current Outpatient Prescriptions  Medication Sig Dispense Refill  . amLODipine (NORVASC) 5 MG tablet Take 5 mg by mouth daily.      Marland Kitchen aspirin 81 MG tablet Take 81 mg by mouth daily.       Marland Kitchen b complex vitamins tablet Take 1 tablet by mouth daily.      . beta carotene 16109 UNIT capsule Take 25,000 Units by mouth daily.        . calcium carbonate (OS-CAL) 600 MG TABS Take 600 mg by mouth daily.      .  cholecalciferol (VITAMIN D) 1000 UNITS tablet Take 1,000 Units by mouth daily.      . Coenzyme Q10 (CO Q-10 PO) Take 1 capsule by mouth daily. (200mg ) 1 Tab Daily.      . cyanocobalamin 1000 MCG tablet Take 1,000 mcg by mouth daily.      . Digestive Enzymes (PAPAYA AND ENZYMES PO) Take by mouth. 1 Tab daily      . folic acid (FOLVITE) 800 MCG tablet Take 800 mcg by mouth daily.        Marland Kitchen KLOR-CON M20 20 MEQ tablet 1 Tab daily      . L-Arginine 1000 MG TABS Take by mouth. 1 Tab daily      . metoprolol (TOPROL-XL) 50 MG 24 hr tablet Take 50mg  in the morning and 25mg  in the evening      . OVER THE COUNTER MEDICATION Magnesium Oxide (Tab) 270mg  1 Tab daily      . pantoprazole (PROTONIX) 40 MG tablet Take 40 mg by mouth daily.      Marland Kitchen PRESCRIPTION MEDICATION Abana - prescribed in [place]. 2 pills by mouth in the morning and 1 pill in the evening.       . Probiotic Product (ALIGN PO) Take 1 capsule by mouth daily.       . rosuvastatin (CRESTOR) 10 MG tablet Take 10 mg by mouth daily.        Marland Kitchen  silodosin (RAPAFLO) 8 MG CAPS capsule Take 8 mg by mouth daily with breakfast.      . Thiamine HCl (VITAMIN B-1) 100 MG tablet Take 50 mg by mouth daily.       . vitamin C (ASCORBIC ACID) 500 MG tablet Take 500 mg by mouth daily.      Marland Kitchen zinc gluconate 50 MG tablet Take 50 mg by mouth every other day.         Allergies  Allergen Reactions  . Ace Inhibitors   . Amoxicillin   . Atorvastatin   . Azithromycin   . Ibuprofen   . Penicillins     Review of Systems negative except from HPI and PMH  Physical Exam BP 160/78  Pulse 75  Ht 5' 10.8" (1.798 m)  Wt 160 lb (72.576 kg)  BMI 22.44 kg/m2 Well developed and well nourished in no acute distress HENT normal E scleral and icterus clear Neck Supple JVP flat; carotids brisk and full Clear to ausculation 2/6 murmur along the right sternal border Soft with active bowel sounds No clubbing cyanosis n 1+ Edema Alert and oriented,  weakness of the thenar  muscles with wasting Skin Warm and Dry  Sinus rhythm 75 Intervals 21/10/39 Axis leftward -70 LVH with repolarization abnormalities  Assessment and  Plan

## 2012-08-07 ENCOUNTER — Ambulatory Visit (HOSPITAL_COMMUNITY): Payer: Medicare Other | Attending: Internal Medicine | Admitting: Radiology

## 2012-08-07 VITALS — BP 130/69 | Ht 71.0 in | Wt 157.0 lb

## 2012-08-07 DIAGNOSIS — R55 Syncope and collapse: Secondary | ICD-10-CM

## 2012-08-07 MED ORDER — REGADENOSON 0.4 MG/5ML IV SOLN
0.4000 mg | Freq: Once | INTRAVENOUS | Status: AC
  Administered 2012-08-07: 0.4 mg via INTRAVENOUS

## 2012-08-07 MED ORDER — TECHNETIUM TC 99M SESTAMIBI GENERIC - CARDIOLITE
31.0000 | Freq: Once | INTRAVENOUS | Status: AC | PRN
  Administered 2012-08-07: 31 via INTRAVENOUS

## 2012-08-07 MED ORDER — TECHNETIUM TC 99M SESTAMIBI GENERIC - CARDIOLITE
9.0000 | Freq: Once | INTRAVENOUS | Status: AC | PRN
  Administered 2012-08-07: 9 via INTRAVENOUS

## 2012-08-07 NOTE — Progress Notes (Signed)
[facility] SITE 3 NUCLEAR MED [id number] [place] [address] [phone]  Cardiology Nuclear Med Study  Dalton Cardenas is a 78 y.o. male     MRN : [id number]     DOB: 10/13/1933  Procedure Date: 08/07/2012  Nuclear Med Background Indication for Stress Test:  Evaluation for Ischemia and pending Clearance for possible Ablation, PSVT History:  Abnormal EKG,04/26/10 MPS: EF: 77% NL, 08/2010 Heart Cath: CFX 60-70% 07/23/12 EF: 55-60% mod LVH, mild AR Cardiac Risk Factors: Family History - CAD, Hypertension and Lipids  Symptoms:  Dizziness, DOE, Fatigue, Light-Headedness, Nausea, Palpitations, SOB and Syncope   Nuclear Pre-Procedure Caffeine/Decaff Intake:  None > 12 hrs NPO After: 8:00pm   Lungs:  clear O2 Sat: 98% on room air. IV 0.9% NS with Angio Cath:  22g  IV Site: R Antecubital x 1, tolerated well IV Started by:  Irean Hong, RN  Chest Size (in):  40 Cup Size: n/a  Height: 5\' 11"  (1.803 m)  Weight:  157 lb (71.215 kg)  BMI:  Body mass index is 21.90 kg/(m^2). Tech Comments:  Took Toprol this am    Nuclear Med Study 1 or 2 day study: 1 day  Stress Test Type:  Treadmill/Lexiscan  Reading MD: Dietrich Pates, MD  Order Authorizing Provider:  Sherryl Manges, MD  Resting Radionuclide: Technetium 36m Sestamibi  Resting Radionuclide Dose: 9.0 mCi   Stress Radionuclide:  Technetium 51m Sestamibi  Stress Radionuclide Dose: 31.4 mCi           Stress Protocol Rest HR: 54 Stress HR: 104  Rest BP: 130/69 Stress BP: 179/82  Exercise Time (min): n/a METS: n/a   Predicted Max HR: 144 bpm % Max HR: 72.22 bpm Rate Pressure Product: 62952   Dose of Adenosine (mg):  n/a Dose of Lexiscan: 0.4 mg  Dose of Atropine (mg): n/a Dose of Dobutamine: n/a mcg/kg/min (at max HR)  Stress Test Technologist: Milana Na, EMT-P  Nuclear Technologist:  Domenic Polite, CNMT     Rest Procedure:  Myocardial perfusion imaging was performed at rest 45 minutes  following the intravenous administration of Technetium 44m Sestamibi. Rest ECG: Sinus Bradycardia with 1st degree AVB  Stress Procedure:  The patient received IV Lexiscan 0.4 mg over 15-seconds with concurrent low level exercise and then Technetium 73m Sestamibi was injected at 30-seconds while the patient continued walking one more minute. There were no significant changes with Lexiscan and occ pacs. Quantitative spect images were obtained after a 45-minute delay. Stress ECG: No significant change from baseline ECG  QPS Raw Data Images:  Images were motion corrected.  Soft tissue (diaphragm, bowel) underlie heart. Stress Images:  Normal homogeneous uptake in all areas of the myocardium. Rest Images:  Normal homogeneous uptake in all areas of the myocardium. Subtraction (SDS):  No evidence of ischemia. Transient Ischemic Dilatation (Normal <1.22):  0.20 Lung/Heart Ratio (Normal <0.45):  1.05  Quantitative Gated Spect Images QGS EDV:  82 ml QGS ESV:  20 ml  Impression Exercise Capacity:  Lexiscan with low level exercise. BP Response:  Normal blood pressure response. Clinical Symptoms:  No significant symptoms noted. ECG Impression:  No significant ST segment change suggestive of ischemia. Comparison with Prior Nuclear Study: No change from previous report.  Overall Impression:  Normal stress nuclear study.  LV Ejection Fraction: 76%.  LV Wall Motion:  NL LV Function; NL Wall Motion Dietrich Pates

## 2012-08-08 ENCOUNTER — Telehealth: Payer: Self-pay | Admitting: Internal Medicine

## 2012-08-08 NOTE — Telephone Encounter (Signed)
plz return call to pt [phone], regarding test results

## 2012-08-08 NOTE — Telephone Encounter (Signed)
Pt notified of stress test results.

## 2012-08-08 NOTE — Progress Notes (Signed)
Pt.notified

## 2014-02-04 ENCOUNTER — Encounter (INDEPENDENT_AMBULATORY_CARE_PROVIDER_SITE_OTHER): Payer: Medicare Other | Admitting: Ophthalmology

## 2014-02-04 DIAGNOSIS — H35039 Hypertensive retinopathy, unspecified eye: Secondary | ICD-10-CM

## 2014-02-04 DIAGNOSIS — H43819 Vitreous degeneration, unspecified eye: Secondary | ICD-10-CM

## 2014-02-04 DIAGNOSIS — H35419 Lattice degeneration of retina, unspecified eye: Secondary | ICD-10-CM

## 2014-02-04 DIAGNOSIS — I10 Essential (primary) hypertension: Secondary | ICD-10-CM

## 2014-02-04 DIAGNOSIS — H35379 Puckering of macula, unspecified eye: Secondary | ICD-10-CM

## 2014-02-04 DIAGNOSIS — H33309 Unspecified retinal break, unspecified eye: Secondary | ICD-10-CM

## 2014-02-17 ENCOUNTER — Encounter (INDEPENDENT_AMBULATORY_CARE_PROVIDER_SITE_OTHER): Payer: Medicare Other | Admitting: Ophthalmology

## 2014-02-17 DIAGNOSIS — H33309 Unspecified retinal break, unspecified eye: Secondary | ICD-10-CM

## 2014-02-24 ENCOUNTER — Ambulatory Visit (INDEPENDENT_AMBULATORY_CARE_PROVIDER_SITE_OTHER): Payer: Medicare Other | Admitting: Ophthalmology

## 2014-02-24 DIAGNOSIS — H33309 Unspecified retinal break, unspecified eye: Secondary | ICD-10-CM

## 2014-06-28 ENCOUNTER — Ambulatory Visit (INDEPENDENT_AMBULATORY_CARE_PROVIDER_SITE_OTHER): Payer: Medicare Other | Admitting: Ophthalmology

## 2014-06-28 DIAGNOSIS — I10 Essential (primary) hypertension: Secondary | ICD-10-CM

## 2014-06-28 DIAGNOSIS — H43813 Vitreous degeneration, bilateral: Secondary | ICD-10-CM

## 2014-06-28 DIAGNOSIS — H33303 Unspecified retinal break, bilateral: Secondary | ICD-10-CM | POA: Diagnosis not present

## 2014-06-28 DIAGNOSIS — H35371 Puckering of macula, right eye: Secondary | ICD-10-CM | POA: Diagnosis not present

## 2014-06-28 DIAGNOSIS — H35033 Hypertensive retinopathy, bilateral: Secondary | ICD-10-CM

## 2014-07-08 ENCOUNTER — Ambulatory Visit (INDEPENDENT_AMBULATORY_CARE_PROVIDER_SITE_OTHER): Payer: Medicare Other | Admitting: Internal Medicine

## 2014-07-08 ENCOUNTER — Encounter: Payer: Self-pay | Admitting: Internal Medicine

## 2014-07-08 VITALS — BP 158/72 | HR 65 | Ht 67.0 in | Wt 163.8 lb

## 2014-07-08 DIAGNOSIS — H8110 Benign paroxysmal vertigo, unspecified ear: Secondary | ICD-10-CM

## 2014-07-08 DIAGNOSIS — I471 Supraventricular tachycardia: Secondary | ICD-10-CM

## 2014-07-08 DIAGNOSIS — I10 Essential (primary) hypertension: Secondary | ICD-10-CM

## 2014-07-08 MED ORDER — ATENOLOL 50 MG PO TABS: ORAL_TABLET | ORAL | Status: DC

## 2014-07-08 MED ORDER — PROPRANOLOL HCL ER 120 MG PO CP24: 120.0000 mg | ORAL_CAPSULE | Freq: Every day | ORAL | Status: DC

## 2014-07-08 MED ORDER — METOPROLOL TARTRATE 50 MG PO TABS
ORAL_TABLET | ORAL | Status: DC
Start: 2014-07-08 — End: 2014-07-08

## 2014-07-08 MED ORDER — NEBIVOLOL HCL 10 MG PO TABS: 10.0000 mg | ORAL_TABLET | Freq: Every day | ORAL | Status: DC

## 2014-07-08 NOTE — Progress Notes (Signed)
Patient Care Team: Gerrit Heck, MD as PCP - General (Family Medicine)   HPI  Dalton Cardenas is a 80 y.o. male Seen in followup for palpitations identified on Holter monitoring in [place]. 2 substrate for identified consistent with AV nodal reentry and multifocal atrial tachycardia. Anti cholesterol medication prescribed in [place] at a therapeutic effect on his palpitations.  He has hx of syncope with bifascicular block  He has known CAD without prior MI; Myoview 11/13 was normal  We have not seen him on 2 years  He comes in today with complaints of his blood pressure being elevated; he notes that he cannot take ACE inhibitors because of angioedema. He did not tolerate calcium blockers because of peripheral edema.  He has treated sleep apnea.  He also has had problems recently with   positional vertigo    Past Medical History  Diagnosis Date  . Prostate cancer     status post prostatectomy  . Hypertension   . Hypercholesterolemia   . Coronary artery disease     60-70% circumflex (cath in [place] 08/2010).  . SVT (supraventricular tachycardia)     a. Holter monitoring 2011 - AV node reentry, multifocal atrial tachycardia, and resting bradycardia. b. previously tx with Abana in [place].    Past Surgical History  Procedure Laterality Date  . Prostatectomy      Current Outpatient Prescriptions  Medication Sig Dispense Refill  . amLODipine (NORVASC) 5 MG tablet Take 5 mg by mouth daily.      Marland Kitchen aspirin 81 MG tablet Take 81 mg by mouth daily.       Marland Kitchen b complex vitamins tablet Take 1 tablet by mouth daily.      . calcium carbonate (OS-CAL) 600 MG TABS Take 600 mg by mouth daily.      . cholecalciferol (VITAMIN D) 1000 UNITS tablet Take 1,000 Units by mouth daily.      . Coenzyme Q10 (CO Q-10 PO) Take 1 capsule by mouth daily. (200mg ) 1 Tab Daily.      . cyanocobalamin 1000 MCG tablet Take 1,000 mcg by mouth daily.      . Digestive Enzymes (PAPAYA AND ENZYMES PO) Take by  mouth. 1 Tab daily      . folic acid (FOLVITE) 401 MCG tablet Take 800 mcg by mouth daily.        Marland Kitchen L-Arginine 1000 MG TABS Take by mouth. 1 Tab daily      . metoprolol (TOPROL-XL) 50 MG 24 hr tablet Take 50mg  in the morning and 25mg  in the evening      . OVER THE COUNTER MEDICATION 400 mg. Magnesium Oxide (Tab) 270mg  1 Tab daily      . PRESCRIPTION MEDICATION Abana - prescribed in [place]. 2 pills by mouth in the morning and 1 pill in the evening.       . Probiotic Product (ALIGN PO) Take 1 capsule by mouth daily.       . rosuvastatin (CRESTOR) 10 MG tablet Take 10 mg by mouth daily.        . Thiamine HCl (VITAMIN B-1) 100 MG tablet Take 50 mg by mouth daily.       . vitamin C (ASCORBIC ACID) 500 MG tablet Take 500 mg by mouth daily.      Marland Kitchen zinc gluconate 50 MG tablet Take 50 mg by mouth every other day.        No current facility-administered medications for this visit.    Allergies  Allergen  Reactions  . Amoxicillin     GI upset: Bleeding diarrhea   . Ibuprofen     Bloody diarrhea   . Penicillins     Bloody diarrhea   . Ace Inhibitors     Face swelling   . Atorvastatin     Weakness   . Azithromycin     Stomach ache   . Diltiazem Nausea Only    Upset stomach per patient    Review of Systems negative except from HPI and PMH  Physical Exam BP 158/72  Pulse 65  Ht 5\' 7"  (1.702 m)  Wt 163 lb 12.8 oz (74.299 kg)  BMI 25.65 kg/m2 Well developed and well nourished in no acute distress HENT normal E scleral and icterus clear Neck Supple JVP flat; carotids brisk and full Clear to ausculation 2/6 murmur along the right sternal border Soft with active bowel sounds No clubbing cyanosis n 1+ Edema Alert and oriented,  weakness of the thenar muscles with wasting Skin Warm and Dry  Sinus rhythm 75 Intervals 21/10/39 Axis leftward -70 LVH with repolarization abnormalities  Assessment and  Plan  Hypertension  Vertigo  SVT  As relates to his blood pressure, we  discussed the role of aldosterone antagonists, clonidine, and hydralazine and alternatives. However, if we can find a beta blocker that he tolerates, perhaps up titration since the drug would be allowed for single drug therapy. I not sanguine about this. Still however, given him prescriptions for Inderal LA 120 and bisoprolol 10  as an alternative to his metoprolol succinate which in his mind is associated with fatigue; previously atenolol to metoprolol tartrate have also been problematic.  I think his dizziness is vertiginous i.e. benign positional vertigo. I offered him prescription for meclizine which he declined because of its association somnolence

## 2014-07-08 NOTE — Patient Instructions (Addendum)
Your physician has recommended you make the following change in your medication: You have been given 2 different prescriptions to try. You may take them in any order. DO NOT take both at the same time.  1) bystolic 10 mg one tablet by mouth once daily 2) inderal la 120 mg one tablet by mouth once daily  3)Stop metoprolol succinate.  Your physician wants you to follow-up in: 2 years with Dr. Caryl Comes. You will receive a reminder letter in the mail two months in advance. If you don't receive a letter, please call our office to schedule the follow-up appointment.

## 2014-09-28 ENCOUNTER — Encounter: Payer: Self-pay | Admitting: *Deleted

## 2014-09-28 ENCOUNTER — Ambulatory Visit (INDEPENDENT_AMBULATORY_CARE_PROVIDER_SITE_OTHER): Payer: Medicare Other | Admitting: Internal Medicine

## 2014-09-28 ENCOUNTER — Encounter: Payer: Self-pay | Admitting: Internal Medicine

## 2014-09-28 VITALS — BP 112/78 | HR 105 | Ht 67.0 in | Wt 166.6 lb

## 2014-09-28 DIAGNOSIS — I471 Supraventricular tachycardia, unspecified: Secondary | ICD-10-CM

## 2014-09-28 DIAGNOSIS — I10 Essential (primary) hypertension: Secondary | ICD-10-CM

## 2014-09-28 NOTE — Patient Instructions (Signed)
Your physician has recommended that you have an ablation. Catheter ablation is a medical procedure used to treat some cardiac arrhythmias (irregular heartbeats). During catheter ablation, a long, thin, flexible tube is put into a blood vessel in your groin (upper thigh), or neck. This tube is called an ablation catheter. It is then guided to your heart through the blood vessel. Radio frequency waves destroy small areas of heart tissue where abnormal heartbeats may cause an arrhythmia to start. Please see the instruction sheet given to you today.   See instruction sheet for procedure 

## 2014-09-28 NOTE — Progress Notes (Signed)
Patient Care Team: Gerrit Heck, MD as PCP - General (Family Medicine)   HPI  Dalton Cardenas is a 81 y.o. male Seen in followup for palpitations identified on Holter monitoring in [place]. 2 substrate for identified consistent with AV nodal reentry and multifocal atrial tachycardia. Anti cholesterol medication prescribed in [place] had  a therapeutic effect on his palpitations.  He has hx of syncope with bifascicular block  He has known CAD without prior MI; Myoview 11/13 was normal  We have not seen him on 2 years  He is seen today following her presentation yesterday to Dr. Lavone Nian. I get that there were some frustration with a sense of our having been dismissive of his concerns. In Dr. Timoteo Expose office in ECG was obtained that demonstrated narrow QRS tachycardia at her heart rate of about 1:15-20 with retrograde P waves discernible of the proximal ST segment. This is most consistent with AV nodal reentry particularly based on his prior Holter  This is been coming and going but mostly present over the last couple of weeks. It is associated with some degree of exercise intolerance as well as lightheadedness. There has been no worsening of peripheral edema which is attributed to his amlodipine    Past Medical History  Diagnosis Date  . Prostate cancer     status post prostatectomy  . Hypertension   . Hypercholesterolemia   . Coronary artery disease     60-70% circumflex (cath in [place] 08/2010).  . SVT (supraventricular tachycardia)     a. Holter monitoring 2011 - AV node reentry, multifocal atrial tachycardia, and resting bradycardia. b. previously tx with Abana in [place].    Past Surgical History  Procedure Laterality Date  . Prostatectomy      Current Outpatient Prescriptions  Medication Sig Dispense Refill  . amLODipine (NORVASC) 5 MG tablet Take 5 mg by mouth daily.    Marland Kitchen aspirin 81 MG tablet Take 81 mg by mouth daily.     Marland Kitchen b complex vitamins tablet Take 1 tablet by mouth daily.     . calcium carbonate (OS-CAL) 600 MG TABS Take 600 mg by mouth daily.    . cholecalciferol (VITAMIN D) 1000 UNITS tablet Take 1,000 Units by mouth daily.    . Coenzyme Q10 (CO Q-10 PO) Take 1 capsule by mouth daily. (200mg ) 1 Tab Daily.    . cyanocobalamin 1000 MCG tablet Take 1,000 mcg by mouth daily.    . Digestive Enzymes (PAPAYA AND ENZYMES PO) Take by mouth. 1 Tab daily    . folic acid (FOLVITE) 431 MCG tablet Take 800 mcg by mouth daily.      Marland Kitchen L-Arginine 1000 MG TABS Take by mouth. 1 Tab daily    . OVER THE COUNTER MEDICATION 400 mg. Magnesium Oxide (Tab) 270mg  1 Tab daily    . PRESCRIPTION MEDICATION Abana - prescribed in [place]. 2 pills by mouth in the morning and 1 pill in the evening.     . Probiotic Product (ALIGN PO) Take 1 capsule by mouth daily.     . propranolol ER (INDERAL LA) 120 MG 24 hr capsule Take 1 capsule (120 mg total) by mouth daily. 30 capsule 0  . rosuvastatin (CRESTOR) 10 MG tablet Take 10 mg by mouth daily.      . Thiamine HCl (VITAMIN B-1) 100 MG tablet Take 50 mg by mouth daily.     . vitamin C (ASCORBIC ACID) 500 MG tablet Take 500 mg by mouth daily.    Marland Kitchen  zinc gluconate 50 MG tablet Take 50 mg by mouth every other day.      No current facility-administered medications for this visit.    Allergies  Allergen Reactions  . Amoxicillin     GI upset: Bleeding diarrhea   . Ibuprofen     Bloody diarrhea   . Penicillins     Bloody diarrhea   . Ace Inhibitors     Face swelling   . Atorvastatin     Weakness   . Azithromycin     Stomach ache   . Diltiazem Nausea Only    Upset stomach per patient    Review of Systems negative except from HPI and PMH  Physical Exam Ht 5\' 7"  (1.702 m)  Wt 166 lb 9.6 oz (75.569 kg)  BMI 26.09 kg/m2 Well developed and well nourished in no acute distress HENT normal E scleral and icterus clear Neck Supple JVP flat; carotids brisk and full Clear to ausculation Rapid but regular rhythm 2/6 murmur along the right  sternal border Soft with active bowel sounds No clubbing cyanosis n 1+ Edema Alert and oriented,  weakness of the thenar muscles with wasting Skin Warm and Dry   AV nodal reentry tachycardia at a rate of 110 bpm. Retrograde P waves are noticeable in the proximal portion of the ST segment. Carotid massage terminated the tachycardia; it resumed shortly thereafter with a very long PR interval     Assessment and  Plan  Hypertension  Vertigo aggravated by SVT  SVT  He has incessant AV nodal reentry tachycardia at a relatively slow rate. We discussed treatment options and he is agreeable to proceed with catheter ablation. We discussed potential risks including but not limited to death as well as heart block requiring pacemaker implantation. In the interim, we will discontinue his amlodipine because of peripheral edema and have him increase his metoprolol from 50/25--50 twice a day. Probably ablative procedure, we would anticipate trying him on clonidine 0.1 mg twice daily

## 2014-09-30 ENCOUNTER — Encounter: Payer: Self-pay | Admitting: *Deleted

## 2014-10-01 ENCOUNTER — Encounter: Payer: Self-pay | Admitting: Pulmonary Disease

## 2014-10-01 ENCOUNTER — Ambulatory Visit (INDEPENDENT_AMBULATORY_CARE_PROVIDER_SITE_OTHER): Payer: Medicare Other | Admitting: Pulmonary Disease

## 2014-10-01 VITALS — BP 124/78 | HR 60 | Ht 67.0 in | Wt 165.0 lb

## 2014-10-01 DIAGNOSIS — G4733 Obstructive sleep apnea (adult) (pediatric): Secondary | ICD-10-CM

## 2014-10-01 MED ORDER — LANSOPRAZOLE 30 MG PO CPDR: 30.0000 mg | DELAYED_RELEASE_CAPSULE | Freq: Every day | ORAL | Status: DC

## 2014-10-01 NOTE — Assessment & Plan Note (Signed)
Your CPAP is set at 12 cm Pressure is adequate OSA appears to be well treated-and this does not seem to be the cause for his breakthrough SVTs  compliance with goal of at least 6 hrs every night is the expectation. Advised against medications with sedative side effects Cautioned against driving when sleepy - understanding that sleepiness will vary on a day to day basis  Rx for prevacid 30 mg #30 x 2 refills

## 2014-10-01 NOTE — Patient Instructions (Addendum)
Your CPAP is set at 12 cm Pressure is adequate Rx for prevacid 30 mg #30 x 2 refills Good luck with ablation !

## 2014-10-01 NOTE — Progress Notes (Signed)
Subjective:    Patient ID: Dalton Cardenas, male    DOB: Jan 30, 1934, 81 y.o.   MRN: [id number]  HPI PCP - Leighton Ruff  81 year old, never smoker, from [place], presents for management of OSA. OSA was diagnosed in 2010, he was placed on CPAP of 12 cm, after initial adjustments, he is now settled on a full face mask. 6 month Download 09/2014 shows good compliance more than 6.5 hours. He has SVT-reentrant tachycardia and is scheduled for ablation procedure in 2 weeks. He has just moved to a new house, and admits to stressors. He also has severe reflux and was on chronic PPI therapy. Epworth sleepiness score is 4. Bedtime is around 10-11 PM, keep latency is minimal, he sleeps on his side with one pillow, reports 2-3 nocturnal awakenings, and is out of bed by 7 AM feeling rested with occasional dryness of mouth. Weight is mostly unchanged.  Past Medical History  Diagnosis Date  . Prostate cancer     status post prostatectomy  . Hypertension   . Hypercholesterolemia   . Coronary artery disease     60-70% circumflex (cath in [place] 08/2010).  . SVT (supraventricular tachycardia)     a. Holter monitoring 2011 - AV node reentry, multifocal atrial tachycardia, and resting bradycardia. b. previously tx with Abana in [place].    Past Surgical History  Procedure Laterality Date  . Prostatectomy      Allergies  Allergen Reactions  . Amoxicillin     GI upset: Bleeding diarrhea   . Ibuprofen     Bloody diarrhea   . Penicillins     Bloody diarrhea   . Ace Inhibitors     Face swelling   . Atorvastatin     Weakness   . Azithromycin     Stomach ache   . Diltiazem Nausea Only    Upset stomach per patient    History   Social History  . Marital Status: Married    Spouse Name: N/A    Number of Children: 4  . Years of Education: N/A   Occupational History  . Retired    Social History Main Topics  . Smoking status: Never Smoker   . Smokeless tobacco: Never Used  . Alcohol  Use: No  . Drug Use: No  . Sexual Activity: Not on file   Other Topics Concern  . Not on file   Social History Narrative   Regular exercise: Yes    Family History  Problem Relation Age of Onset  . Diabetes Neg Hx   . Coronary artery disease Neg Hx   . Hypertension Mother   . Heart disease Father   . Hypertension Sister   . Hypertension Brother        Review of Systems  Constitutional: Negative for fever and unexpected weight change.  HENT: Negative for congestion, dental problem, ear pain, nosebleeds, postnasal drip, rhinorrhea, sinus pressure, sneezing, sore throat and trouble swallowing.   Eyes: Negative for redness and itching.  Respiratory: Positive for apnea. Negative for cough, chest tightness, shortness of breath and wheezing.   Cardiovascular: Negative for palpitations and leg swelling.  Gastrointestinal: Negative for nausea and vomiting.  Genitourinary: Negative for dysuria.  Musculoskeletal: Negative for joint swelling.  Skin: Negative for rash.  Neurological: Negative for headaches.  Hematological: Does not bruise/bleed easily.  Psychiatric/Behavioral: Negative for dysphoric mood. The patient is not nervous/anxious.        Objective:   Physical Exam  Gen. Pleasant, thin, in no  distress, normal affect ENT - no lesions, no post nasal drip Neck: No JVD, no thyromegaly, no carotid bruits Lungs: no use of accessory muscles, no dullness to percussion, clear without rales or rhonchi  Cardiovascular: Rhythm regular, heart sounds  normal, no murmurs or gallops, no peripheral edema Abdomen: soft and non-tender, no hepatosplenomegaly, BS normal. Musculoskeletal: No deformities, no cyanosis or clubbing Neuro:  alert, non focal       Assessment & Plan:

## 2014-10-07 ENCOUNTER — Other Ambulatory Visit (INDEPENDENT_AMBULATORY_CARE_PROVIDER_SITE_OTHER): Payer: Medicare Other | Admitting: *Deleted

## 2014-10-07 DIAGNOSIS — I10 Essential (primary) hypertension: Secondary | ICD-10-CM

## 2014-10-07 DIAGNOSIS — I471 Supraventricular tachycardia: Secondary | ICD-10-CM

## 2014-10-07 LAB — BASIC METABOLIC PANEL
BUN: 18 mg/dL (ref 6–23)
CO2: 27 mEq/L (ref 19–32)
CREATININE: 0.95 mg/dL (ref 0.40–1.50)
Calcium: 9.3 mg/dL (ref 8.4–10.5)
Chloride: 105 mEq/L (ref 96–112)
GFR: 81.35 mL/min (ref >60.00)
Glucose, Bld: 109 mg/dL — ABNORMAL HIGH (ref 70–99)
POTASSIUM: 4.2 meq/L (ref 3.5–5.1)
Sodium: 139 mEq/L (ref 135–145)

## 2014-10-07 LAB — CBC WITH DIFFERENTIAL/PLATELET
Basophils Absolute: 0.1 10*3/uL (ref 0.0–0.1)
Basophils Relative: 0.9 % (ref 0.0–3.0)
EOS ABS: 0.4 10*3/uL (ref 0.0–0.7)
Eosinophils Relative: 5.7 % — ABNORMAL HIGH (ref 0.0–5.0)
HCT: 38.5 % — ABNORMAL LOW (ref 39.0–52.0)
HEMOGLOBIN: 13.3 g/dL (ref 13.0–17.0)
Lymphocytes Relative: 35.7 % (ref 12.0–46.0)
Lymphs Abs: 2.4 10*3/uL (ref 0.7–4.0)
MCHC: 34.5 g/dL (ref 30.0–36.0)
MCV: 87.1 fl (ref 78.0–100.0)
MONO ABS: 0.5 10*3/uL (ref 0.1–1.0)
Monocytes Relative: 7.3 % (ref 3.0–12.0)
NEUTROS ABS: 3.4 10*3/uL (ref 1.4–7.7)
Neutrophils Relative %: 50.4 % (ref 43.0–77.0)
PLATELETS: 224 10*3/uL (ref 150.0–400.0)
RBC: 4.42 Mil/uL (ref 4.22–5.81)
RDW: 13.2 % (ref 11.5–15.5)
WBC: 6.8 10*3/uL (ref 4.0–10.5)

## 2014-10-07 NOTE — Addendum Note (Signed)
Addended by: Eulis Foster on: 10/07/2014 09:47 AM   Modules accepted: Orders

## 2014-10-09 ENCOUNTER — Telehealth: Payer: Self-pay | Admitting: Pulmonary Disease

## 2014-10-09 NOTE — Telephone Encounter (Signed)
PSG 10/2008 reviewed >> AHI 16, predom supine corrected by CPAP 10 cm Later on, adjusted by autoCPAP to 12 cm

## 2014-10-13 ENCOUNTER — Encounter (HOSPITAL_COMMUNITY)
Admission: RE | Disposition: A | Discharge status: Home / Self Care | Payer: Self-pay | Source: Ambulatory Visit | Attending: Internal Medicine

## 2014-10-13 ENCOUNTER — Ambulatory Visit (HOSPITAL_COMMUNITY)
Admission: RE | Admit: 2014-10-13 | Discharge: 2014-10-13 | Disposition: A | Discharge status: Home / Self Care | Payer: Medicare Other | Source: Ambulatory Visit | Attending: Internal Medicine | Admitting: Internal Medicine

## 2014-10-13 ENCOUNTER — Encounter (HOSPITAL_COMMUNITY): Payer: Self-pay | Admitting: General Practice

## 2014-10-13 DIAGNOSIS — I471 Supraventricular tachycardia: Secondary | ICD-10-CM | POA: Insufficient documentation

## 2014-10-13 DIAGNOSIS — Z8546 Personal history of malignant neoplasm of prostate: Secondary | ICD-10-CM | POA: Insufficient documentation

## 2014-10-13 DIAGNOSIS — Z7982 Long term (current) use of aspirin: Secondary | ICD-10-CM | POA: Diagnosis not present

## 2014-10-13 DIAGNOSIS — I10 Essential (primary) hypertension: Secondary | ICD-10-CM | POA: Insufficient documentation

## 2014-10-13 DIAGNOSIS — Z79899 Other long term (current) drug therapy: Secondary | ICD-10-CM | POA: Diagnosis not present

## 2014-10-13 DIAGNOSIS — I2510 Atherosclerotic heart disease of native coronary artery without angina pectoris: Secondary | ICD-10-CM | POA: Diagnosis not present

## 2014-10-13 DIAGNOSIS — E785 Hyperlipidemia, unspecified: Secondary | ICD-10-CM | POA: Insufficient documentation

## 2014-10-13 HISTORY — PX: ELECTROPHYSIOLOGY STUDY: SHX5467

## 2014-10-13 HISTORY — PX: SUPRAVENTRICULAR TACHYCARDIA ABLATION: SHX5492

## 2014-10-13 HISTORY — PX: ABLATION OF DYSRHYTHMIC FOCUS: SHX254

## 2014-10-13 HISTORY — DX: Sleep apnea, unspecified: G47.30

## 2014-10-13 SURGERY — ELECTROPHYSIOLOGY STUDY
Anesthesia: LOCAL

## 2014-10-13 MED ORDER — MIDAZOLAM HCL 5 MG/5ML IJ SOLN
INTRAMUSCULAR | Status: AC
  Filled 2014-10-13: qty 5

## 2014-10-13 MED ORDER — FENTANYL CITRATE 0.05 MG/ML IJ SOLN
INTRAMUSCULAR | Status: AC
  Filled 2014-10-13: qty 2

## 2014-10-13 MED ORDER — SODIUM CHLORIDE 0.9 % IV SOLN
2.0000 ug/min | INTRAVENOUS | Status: DC
  Filled 2014-10-13: qty 2

## 2014-10-13 MED ORDER — HEPARIN (PORCINE) IN NACL 2-0.9 UNIT/ML-% IJ SOLN
INTRAMUSCULAR | Status: AC
  Filled 2014-10-13: qty 500

## 2014-10-13 MED ORDER — ACETAMINOPHEN 325 MG PO TABS: 650.0000 mg | ORAL_TABLET | ORAL | Status: DC | PRN

## 2014-10-13 MED ORDER — SODIUM CHLORIDE 0.9 % IJ SOLN: 3.0000 mL | INTRAMUSCULAR | Status: DC | PRN

## 2014-10-13 MED ORDER — ONDANSETRON HCL 4 MG/2ML IJ SOLN: 4.0000 mg | Freq: Four times a day (QID) | INTRAMUSCULAR | Status: DC | PRN

## 2014-10-13 MED ORDER — BUPIVACAINE HCL (PF) 0.25 % IJ SOLN
INTRAMUSCULAR | Status: AC
  Filled 2014-10-13: qty 60

## 2014-10-13 MED ORDER — SODIUM CHLORIDE 0.9 % IJ SOLN
3.0000 mL | Freq: Two times a day (BID) | INTRAMUSCULAR | Status: DC
  Administered 2014-10-13: 3 mL via INTRAVENOUS

## 2014-10-13 MED ORDER — SODIUM CHLORIDE 0.9 % IV SOLN: 250.0000 mL | INTRAVENOUS | Status: DC | PRN

## 2014-10-13 NOTE — CV Procedure (Signed)
Electrophysiology procedure note  Procedure: EP study and catheter ablation of AVNRT using Isuprel  Indication: Symptomatic SVT, refractory to medical therapy  Preprocedure diagnoses: Symptomatic SVT refractory to medical therapy  Postprocedure diagnosis: Same as preprocedure diagnosis  Description of the procedure: After informed consent was obtained, the patient was taken to the diagnostic electrophysiology laboratory in the fasting state. After the usual preparation and draping, intravenous Versed and fentanyl was used for sedation. A 6 French hexapolar catheter was inserted percutaneously into the right jugular vein and advanced to the coronary sinus. A 6 French quadripolar catheter was inserted percutaneously into the right femoral vein and advanced to the right ventricle. A 6 French quadripolar catheter was inserted percutaneously into the right femoral vein and advanced to the his bundle region. After measurement of the basic intervals, rapid ventricular pacing was carried out from the right ventricle, and stepwise decreased down to 690 ms where VA block was demonstrated. During rapid ventricular pacing, the atrial activation was midline and decremental. Next, programmed ventricular stimulation was carried out from the right ventricle at a pacing cycle length of 600 ms. Demonstrating VA dissociation. During programmed ventricular stimulation, atrial activation was midline and decremental. Next programmed atrial stimulation was carried out from the coronary sinus the patient cycle length of 600 ms. The S1-S2 interval was stepwise decreased from 540 ms down to 520 ms where the AV Node ERP was observed. During programmed atrial stimulation, there was an AH jump and multiple echo beats. There was no inducible SVT. Next, rapid atrial pacing was carried out from the coronary cycle length of 600 ms. The patient interval was then decreased down to 480 ms, where AV WB was noted. During atrial pacing, the PR  interval was greater than the RR interval but no SVT was induced. Next, Isuprel was infused at 1-4 mics/min and additional rapid atrial pacing was carried out at 600 ms and stepwise decreased down to 260 ms. During Isuprel infusion, The patient would have frequent echo beats, double echo beats and the PR interval was greater than the RR interval. Ventricular pacing and VEST was carried out on isuprel. There was midline VA conduction which was decremental. The patient had noted that he had taken his beta blocker the night before. 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 The ablation cathter was maneuvered into Koch's triangle and mapping carried out. Koch's triangle was displaced anteriorly. A large sweep ablation cathter was utilized. A single RF energy application was made and accelerated junctional rhythm was seen. After ablation, additional RAP, RVP, AEST, and VEST was carried out and there was no inducible SVT and no evidence of any slow pathway conduction.    Complications: There were no immediate procedure complications  Results. 1. Baseline ECG. The baseline ECG demonstrated normal sinus rhythm with no ventricular preexcitation. 2. Baseline intervals. The sinus node cycle length was 935 ms. The QRS duration was 138 ms. The HV interval was 70 ms, and the AH interval was 84 ms. 3. Rapid ventricular pacing. Rapid ventricular pacing was carried out from the right ventricle demonstrating VA block at a cycle length of 600 ms. During rapid ventricular pacing, the atrial activation was midline and decremental. On isuprel, VA conduction was midline and decremental and there was no inducible VT or evidence of accessory pathway conduction. 4. Programmed ventricular stimulation. Programmed ventricular stimulation was carried out from the right ventricle at a pacing cycle length of 600 ms.  VA dissociation was initially demonstrated. On isuprel,  VEST was carried out and the S1-S2 interval was stepwise decreased from 440 ms down to 270 ms, where ventricular refractoriness was observed. During programmed ventricular stimulation, the atrial activation was midline and decremental. There was no inducible VT 5. Rapid atrial pacing. Rapid atrial pacing was carried out from the coronary sinus and the pacing cycle length of 600 ms, and stepwise decreased down to 480 ms, resulting AV block.On isuprel the pacing cycle length was reduced down to 290 ms. 6. Programmed atrial stimulation. Programmed atrial stimulation was carried out from the coronary sinus at a pacing cycle length of 600 ms. The S1-S2 interval was stepwise decreased from 540 ms, down to 520 ms. During programmed atrial stimulation, there is no inducible SVT initially. There were multiple AH jumps and echo beats. 7. Arrhythmias observed. Sinus tachycardia at 140/min on isuprel. Non-sustained AVNRT. 8. Mapping. Mapping of Koch's triangle demonstrated a very large, anteriorly displaced slow pathway region. 9. Radiofrequency energy application. A single RF energy application was made with accelerated junctional rhythm.   Conclusion. Successful slow pathway ablation of AVNRT in a patient with non-sustained AVNRT and a h/o incessant AVNRT.   Mikle Bosworth.D

## 2014-10-13 NOTE — Progress Notes (Addendum)
Site area: rt IJ Site Prior to Removal:  Level 0  Pressure Applied For:  10 minutes Manual:   yes Patient Status During Pull:  stable Post Pull Site:  Level  0 Post Pull Instructions Given:  yes Post Pull Pulses Present:   na Dressing Applied:  tegaderm Bedrest begins @  Comments: IV saline locked

## 2014-10-13 NOTE — Progress Notes (Signed)
UR Completed Amauris Debois Graves-Bigelow, RN,BSN [phone]  

## 2014-10-13 NOTE — Progress Notes (Signed)
Pt belongings returned, VSS. Vascular sites stable, level 0. Pt ambulated in hallway with no adverse symptoms. Pt and wife verbalized understanding of discharge instructions.

## 2014-10-13 NOTE — Progress Notes (Signed)
Site area: rt groin Site Prior to Removal:  Level  0 Pressure Applied For: 20 minutes Manual:   yes Patient Status During Pull:  stable Post Pull Site:  Level  0 Post Pull Instructions Given:  yes Post Pull Pulses Present: yes Dressing Applied:  tegaderm Bedrest begins @ [id number] Comments:  No complications

## 2014-10-13 NOTE — Discharge Instructions (Signed)
Place your patient instructions text here.Cardiac Ablation Cardiac ablation is a procedure to disable a small amount of heart tissue in very specific places. The heart has many electrical connections. Sometimes these connections are abnormal and can cause the heart to beat very fast or irregularly. By disabling some of the problem areas, heart rhythm can be improved or made normal. Ablation is done for people who:   Have Wolff-Parkinson-White syndrome.   Have other fast heart rhythms (tachycardia).   Have taken medicines for an abnormal heart rhythm (arrhythmia) that resulted in:   No success.   Side effects.   May have a high-risk heartbeat that could result in death.  LET [facility] CARE PROVIDER KNOW ABOUT:   Any allergies you have or any previous reactions you have had to X-ray dye, food (such as seafood), medicine, or tape.   All medicines you are taking, including vitamins, herbs, eye drops, creams, and over-the-counter medicines.   Previous problems you or members of your family have had with the use of anesthetics.   Any blood disorders you have.   Previous surgeries or procedures (such as a kidney transplant) you have had.   Medical conditions you have (such as kidney failure).  RISKS AND COMPLICATIONS Generally, cardiac ablation is a safe procedure. However, problems can occur and include:   Increased risk of cancer. Depending on how long it takes to do the ablation, the dose of radiation can be high.  Bruising and bleeding where a thin, flexible tube (catheter) was inserted during the procedure.   Bleeding into the chest, especially into the sac that surrounds the heart (serious).  Need for a permanent pacemaker if the normal electrical system is damaged.   The procedure may not be fully effective, and this may not be recognized for months. Repeat ablation procedures are sometimes required. BEFORE THE PROCEDURE   Follow any instructions from your  health care provider regarding eating and drinking before the procedure.   Take your medicines as directed at regular times with water, unless instructed otherwise by your health care provider. If you are taking diabetes medicine, including insulin, ask how you are to take it and if there are any special instructions you should follow. It is common to adjust insulin dosing the day of the ablation.  PROCEDURE  An ablation is usually performed in a catheterization laboratory with the guidance of fluoroscopy. Fluoroscopy is a type of X-ray that helps your health care provider see images of your heart during the procedure.   An ablation is a minimally invasive procedure. This means a small cut (incision) is made in either your neck or groin. Your health care provider will decide where to make the incision based on your medical history and physical exam.  An IV tube will be started before the procedure begins. You will be given an anesthetic or medicine to help you relax (sedative).  The skin on your neck or groin will be numbed. A needle will be inserted into a large vein in your neck or groin and catheters will be threaded to your heart.  A special dye that shows up on fluoroscopy pictures may be injected through the catheter. The dye helps your health care provider see the area of the heart that needs treatment.  The catheter has electrodes on the tip. When the area of heart tissue that is causing the arrhythmia is found, the catheter tip will send an electrical current to the area and "scar" the tissue. Three types of  energy can be used to ablate the heart tissue:   Heat (radiofrequency energy).   Laser energy.   Extreme cold (cryoablation).   When the area of the heart has been ablated, the catheter will be taken out. Pressure will be held on the insertion site. This will help the insertion site clot and keep it from bleeding. A bandage will be placed on the insertion site.  AFTER  THE PROCEDURE   After the procedure, you will be taken to a recovery area where your vital signs (blood pressure, heart rate, and breathing) will be monitored. The insertion site will also be monitored for bleeding.   You will need to lie still for 4-6 hours. This is to ensure you do not bleed from the catheter insertion site.  Document Released: 01/20/2009 Document Revised: 01/18/2014 Document Reviewed: 01/26/2013 [facility] Patient Information 2015 [place], [place]. This information is not intended to replace advice given to you by your health care provider. Make sure you discuss any questions you have with your health care provider.

## 2014-10-13 NOTE — H&P (Signed)
Patient Care Team: Gerrit Heck, MD as PCP - General (Family Medicine)   HPI  Dalton Cardenas is a 81 y.o. male Seen in followup for palpitations identified on Holter monitoring in [place]. 2 substrate for identified consistent with AV nodal reentry and multifocal atrial tachycardia. Anti cholesterol medication prescribed in [place] had a therapeutic effect on his palpitations.  He has hx of syncope with bifascicular block He has known CAD without prior MI; Myoview 11/13 was normal We have not seen him on 2 years  He is seen today following her presentation yesterday to Dr. Lavone Nian. I get that there were some frustration with a sense of our having been dismissive of his concerns. In Dr. Timoteo Expose office in ECG was obtained that demonstrated narrow QRS tachycardia at her heart rate of about 115-120 with retrograde P waves discernible of the proximal ST segment. This is most consistent with AV nodal reentry particularly based on his prior Holter  This is been coming and going but mostly present over the last couple of weeks. It is associated with some degree of exercise intolerance as well as lightheadedness. There has been no worsening of peripheral edema which is attributed to his amlodipine    Past Medical History  Diagnosis Date  . Prostate cancer     status post prostatectomy  . Hypertension   . Hypercholesterolemia   . Coronary artery disease     60-70% circumflex (cath in [place] 08/2010).  . SVT (supraventricular tachycardia)     a. Holter monitoring 2011 - AV node reentry, multifocal atrial tachycardia, and resting bradycardia. b. previously tx with Abana in [place].    Past Surgical History  Procedure Laterality Date  . Prostatectomy      Current Outpatient Prescriptions  Medication Sig Dispense Refill  . amLODipine (NORVASC) 5 MG tablet Take 5 mg by mouth daily.    Marland Kitchen aspirin 81 MG tablet Take 81 mg by mouth daily.      Marland Kitchen b complex vitamins tablet Take 1 tablet by mouth daily.    . calcium carbonate (OS-CAL) 600 MG TABS Take 600 mg by mouth daily.    . cholecalciferol (VITAMIN D) 1000 UNITS tablet Take 1,000 Units by mouth daily.    . Coenzyme Q10 (CO Q-10 PO) Take 1 capsule by mouth daily. (200mg ) 1 Tab Daily.    . cyanocobalamin 1000 MCG tablet Take 1,000 mcg by mouth daily.    . Digestive Enzymes (PAPAYA AND ENZYMES PO) Take by mouth. 1 Tab daily    . folic acid (FOLVITE) 829 MCG tablet Take 800 mcg by mouth daily.     Marland Kitchen L-Arginine 1000 MG TABS Take by mouth. 1 Tab daily    . OVER THE COUNTER MEDICATION 400 mg. Magnesium Oxide (Tab) 270mg  1 Tab daily    . PRESCRIPTION MEDICATION Abana - prescribed in [place]. 2 pills by mouth in the morning and 1 pill in the evening.     . Probiotic Product (ALIGN PO) Take 1 capsule by mouth daily.     . propranolol ER (INDERAL LA) 120 MG 24 hr capsule Take 1 capsule (120 mg total) by mouth daily. 30 capsule 0  . rosuvastatin (CRESTOR) 10 MG tablet Take 10 mg by mouth daily.     . Thiamine HCl (VITAMIN B-1) 100 MG tablet Take 50 mg by mouth daily.     . vitamin C (ASCORBIC ACID) 500 MG tablet Take 500 mg by mouth daily.    Marland Kitchen zinc gluconate 50 MG  tablet Take 50 mg by mouth every other day.      No current facility-administered medications for this visit.    Allergies  Allergen Reactions  . Amoxicillin     GI upset: Bleeding diarrhea   . Ibuprofen     Bloody diarrhea   . Penicillins     Bloody diarrhea   . Ace Inhibitors     Face swelling   . Atorvastatin     Weakness   . Azithromycin     Stomach ache   . Diltiazem Nausea Only    Upset stomach per patient    Review of Systems negative except from HPI and PMH  Physical Exam Ht 5\' 7"  (1.702 m)  Wt 166 lb 9.6 oz (75.569 kg)  BMI 26.09 kg/m2 Well developed and well  nourished in no acute distress HENT normal E scleral and icterus clear Neck Supple JVP flat; carotids brisk and full Clear to ausculation Rapid but regular rhythm 2/6 murmur along the right sternal border Soft with active bowel sounds No clubbing cyanosis n 1+ Edema Alert and oriented, weakness of the thenar muscles with wasting Skin Warm and Dry  AV nodal reentry tachycardia at a rate of 110 bpm. Retrograde P waves are noticeable in the proximal portion of the ST segment. Carotid massage terminated the tachycardia; it resumed shortly thereafter with a very long PR interval    Assessment and Plan  Hypertension  Vertigo aggravated by SVT  SVT  He has incessant AV nodal reentry tachycardia at a relatively slow rate. We discussed treatment options and he is agreeable to proceed with catheter ablation. We discussed potential risks including but not limited to death as well as heart block requiring pacemaker implantation. In the interim, we will discontinue his amlodipine because of peripheral edema and have him increase his metoprolol from 50/25--50 twice a day. Probably ablative procedure, we would anticipate trying him on clonidine 0.1 mg twice daily.  Mikle Bosworth.D.

## 2014-10-14 NOTE — Discharge Summary (Signed)
ELECTROPHYSIOLOGY PROCEDURE DISCHARGE SUMMARY    Patient ID: KALYAN BARABAS,  MRN: [id number], DOB/AGE: 09/25/1933 81 y.o.  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  Prostate cancer s/p prostatectomy 2.  Hypertension 3.  Hyperlipidemia 4.  CAD - 60-70% circ   Allergies  Allergen Reactions  . Amoxicillin     GI upset: Bleeding diarrhea   . Ibuprofen     Bloody diarrhea   . Penicillins     Bloody diarrhea   . Ace Inhibitors     Face swelling   . Atorvastatin     Weakness   . Azithromycin     Stomach ache   . Diltiazem Nausea Only    Upset stomach per patient     Procedures This Admission: 1.  Electrophysiology study and radiofrequency catheter ablation on 10-13-14 by Dr Lovena Le.  This study demonstrated inducible AVNRT with successful slow pathway modification.  There were no inducible arrhythmias following ablation and no early apparent complications.   Brief HPI: ANDERSON MIDDLEBROOKS is a 81 y.o. male with a past medical history as outlined above.  He has had increasing tachypalpitations with documented SVT. Risks, benefits, and alternatives to ablation were reviewed with the patient who wished to proceed.   Hospital Course:  The patient was admitted and underwent EPS/RFCA with details as outlined above. He was monitored on telemetry during bedrest which demonstrated sinus rhythm.  Groin and neck incisions were without complication.  They were examined by Dr Lovena Le who considered them stable for discharge to home.  Follow up will be arranged in 4 weeks.  Wound care and restrictions were reviewed with the patient prior to discharge.   Discharge Vitals: Blood pressure 136/51, pulse 64, temperature 98.3 F (36.8 C), temperature source Oral, resp. rate  18, height 5\' 7"  (1.702 m), weight 165 lb (74.844 kg), SpO2 100 %.    Labs:   Lab Results  Component Value Date   WBC 6.8 10/07/2014   HGB 13.3 10/07/2014   HCT 38.5* 10/07/2014   MCV 87.1 10/07/2014   PLT 224.0 10/07/2014   No results for input(s): NA, K, CL, CO2, BUN, CREATININE, CALCIUM, PROT, BILITOT, ALKPHOS, ALT, AST, GLUCOSE in the last 168 hours.  Invalid input(s): LABALBU  Discharge Medications:    Medication List    STOP taking these medications        metoprolol succinate 50 MG 24 hr tablet  Commonly known as:  TOPROL-XL      TAKE these medications        ALIGN PO  Take 1 capsule by mouth daily.     aspirin 81 MG tablet  Take 81 mg by mouth daily.     b complex vitamins tablet  Take 1 tablet by mouth daily.     calcium carbonate 600 MG Tabs tablet  Commonly known as:  OS-CAL  Take 600 mg by mouth daily.     cholecalciferol 1000 UNITS tablet  Commonly known as:  VITAMIN D  Take 1,000 Units by mouth daily.     CO Q-10 PO  Take 1 capsule by mouth daily. (200mg ) 1 Tab Daily.     cyanocobalamin 1000 MCG tablet  Take 1,000 mcg by mouth daily.     folic acid 462 MCG tablet  Commonly known as:  FOLVITE  Take 800  mcg by mouth daily.     L-Arginine 1000 MG Tabs  Take 1 tablet by mouth daily.     lansoprazole 30 MG capsule  Commonly known as:  PREVACID  Take 1 capsule (30 mg total) by mouth daily at 12 noon.     Magnesium 400 MG Tabs  Take 400 mg by mouth daily.     PAPAYA AND ENZYMES PO  Take by mouth. 1 Tab daily     PRESCRIPTION MEDICATION  Abana - prescribed in [place]. 2 pills in the evening qd     rosuvastatin 10 MG tablet  Commonly known as:  CRESTOR  Take 10 mg by mouth daily.     vitamin C 500 MG tablet  Commonly known as:  ASCORBIC ACID  Take 500 mg by mouth daily.     zinc gluconate 50 MG tablet  Take 50 mg by mouth every other day.        Disposition:   Follow-up Information    Follow up with Cristopher Peru, MD On  11/16/2014.   Specialty:  Cardiology   Why:  2:30 pm   Contact information:   [address] [address] [phone]       Duration of Discharge Encounter: less than 30 minutes including physician time.  Signed,   Mikle Bosworth.D.

## 2014-10-15 ENCOUNTER — Telehealth: Payer: Self-pay | Admitting: Internal Medicine

## 2014-10-15 NOTE — Telephone Encounter (Signed)
Spoke with patient and he is concerned with his BP  It has been running high for the last 2 days.  He says the medication was stopped at the hospital after the procedure  His readings are as follows 194/77 HR61 178/71 HR 60 186/78 HR 61  He was on Metoprolol 50 mg twice daily and this was stopped after procedure the Amlodipine was stopped by Dr Caryl Comes 2 weeks ago.  It had a pounding this morning lasted 50min but HR was good.  What should he be taking?

## 2014-10-15 NOTE — Telephone Encounter (Signed)
Have spoken with patient and he will resume his metoprolol and amlodipine

## 2014-10-15 NOTE — Telephone Encounter (Signed)
He reiterated that following ablation he would like to followup with Dr Einar Gip

## 2014-10-15 NOTE — Telephone Encounter (Signed)
New Msg        Pt recently had ablation and would like to ask some questions.    Please return pt call.

## 2014-11-16 ENCOUNTER — Encounter: Payer: Self-pay | Admitting: Internal Medicine

## 2014-11-16 ENCOUNTER — Telehealth: Payer: Self-pay | Admitting: Internal Medicine

## 2014-11-16 ENCOUNTER — Ambulatory Visit (INDEPENDENT_AMBULATORY_CARE_PROVIDER_SITE_OTHER): Payer: Medicare Other | Admitting: Internal Medicine

## 2014-11-16 VITALS — BP 142/84 | HR 72 | Ht 67.0 in | Wt 165.8 lb

## 2014-11-16 DIAGNOSIS — I10 Essential (primary) hypertension: Secondary | ICD-10-CM

## 2014-11-16 DIAGNOSIS — I471 Supraventricular tachycardia: Secondary | ICD-10-CM

## 2014-11-16 DIAGNOSIS — I2510 Atherosclerotic heart disease of native coronary artery without angina pectoris: Secondary | ICD-10-CM

## 2014-11-16 NOTE — Telephone Encounter (Signed)
lmom for patient to return call

## 2014-11-16 NOTE — Patient Instructions (Signed)
Your physician recommends that you schedule a follow-up appointment as needed  

## 2014-11-16 NOTE — Telephone Encounter (Signed)
F/U        Pt returning call from today.

## 2014-11-16 NOTE — Assessment & Plan Note (Signed)
He is doing well status post catheter ablation. We have discussed the unlikelihood of recurrent SVT. He will undergo watchful waiting. No change in medications.

## 2014-11-16 NOTE — Assessment & Plan Note (Signed)
His blood pressure is slightly elevated. He notes that at home it is under better control. He is encouraged to maintain a low-salt diet.

## 2014-11-16 NOTE — Progress Notes (Signed)
HPI Mr. Dalton Cardenas returns today for follow-up. He is a pleasant 81 year old man with incessant SVT who underwent catheter ablation. At the time of his procedure, he had inducible AV node reentrant tachycardia. Since his ablation he has had no recurrent tachypalpitations. He is taking low-dose beta blocker therapy for hypertension. He denies chest pain or shortness of breath. Allergies  Allergen Reactions  . Amoxicillin     GI upset: Bleeding diarrhea   . Ibuprofen     Bloody diarrhea   . Penicillins     Bloody diarrhea   . Ace Inhibitors     Face swelling   . Atorvastatin     Weakness   . Azithromycin     Stomach ache   . Diltiazem Nausea Only    Upset stomach per patient     Current Outpatient Prescriptions  Medication Sig Dispense Refill  . aspirin 81 MG tablet Take 81 mg by mouth daily.     Marland Kitchen b complex vitamins tablet Take 1 tablet by mouth daily.    . calcium carbonate (OS-CAL) 600 MG TABS Take 600 mg by mouth daily.    . cholecalciferol (VITAMIN D) 1000 UNITS tablet Take 1,000 Units by mouth daily.    . Coenzyme Q10 (CO Q-10 PO) Take 1 capsule by mouth daily. (200mg )    . cyanocobalamin 1000 MCG tablet Take 1,000 mcg by mouth daily.    . Digestive Enzymes (PAPAYA AND ENZYMES PO) Take 1 tablet by mouth daily.     . folic acid (FOLVITE) 093 MCG tablet Take 800 mcg by mouth daily.      Marland Kitchen L-Arginine 1000 MG TABS Take 1 tablet by mouth daily.     Marland Kitchen LOSARTAN POTASSIUM-HCTZ PO Take 1 tablet by mouth daily. Pt unsure of strength (11/16/14)    . Magnesium 400 MG TABS Take 400 mg by mouth daily.     . metoprolol succinate (TOPROL-XL) 25 MG 24 hr tablet Take 25 mg by mouth daily.    . Probiotic Product (ALIGN PO) Take 1 capsule by mouth daily.     . rosuvastatin (CRESTOR) 10 MG tablet Take 10 mg by mouth daily.      . vitamin C (ASCORBIC ACID) 500 MG tablet Take 500 mg by mouth daily.    Marland Kitchen zinc gluconate 50 MG tablet Take 50 mg by mouth every other day.      No current  facility-administered medications for this visit.     Past Medical History  Diagnosis Date  . Prostate cancer     status post prostatectomy  . Hypertension   . Hypercholesterolemia   . Coronary artery disease     60-70% circumflex (cath in [place] 08/2010).  . SVT (supraventricular tachycardia)     a. Holter monitoring 2011 - AV node reentry, multifocal atrial tachycardia, and resting bradycardia. b. previously tx with Abana in [place].  . Sleep apnea     ROS:   All systems reviewed and negative except as noted in the HPI.   Past Surgical History  Procedure Laterality Date  . Prostatectomy    . Ablation of dysrhythmic focus  10/13/2014    SVT         by Dr Lovena Le  . Anal fissure repair    . Electrophysiology study N/A 10/13/2014    Procedure: ELECTROPHYSIOLOGY STUDY;  Surgeon: Evans Lance, MD;  Location[facility] CATH LAB;  Service: Cardiovascular;  Laterality: N/A;  . Supraventricular tachycardia ablation N/A 10/13/2014  Procedure: SUPRAVENTRICULAR TACHYCARDIA ABLATION;  Surgeon: Evans Lance, MD;  Location: [facility] CATH LAB;  Service: Cardiovascular;  Laterality: N/A;     Family History  Problem Relation Age of Onset  . Diabetes Neg Hx   . Coronary artery disease Neg Hx   . Hypertension Mother   . Heart disease Father   . Hypertension Sister   . Hypertension Brother      History   Social History  . Marital Status: Married    Spouse Name: N/A  . Number of Children: 4  . Years of Education: N/A   Occupational History  . Retired    Social History Main Topics  . Smoking status: Never Smoker   . Smokeless tobacco: Never Used  . Alcohol Use: No  . Drug Use: No  . Sexual Activity: Not on file   Other Topics Concern  . Not on file   Social History Narrative   Regular exercise: Yes     BP 142/84 mmHg  Pulse 72  Ht 5\' 7"  (1.702 m)  Wt 165 lb 12.8 oz (75.206 kg)  BMI 25.96 kg/m2  Physical Exam:  Well appearing NAD HEENT: Unremarkable Neck:  No JVD, no  thyromegally Lymphatics:  No adenopathy Back:  No CVA tenderness Lungs:  Clear HEART:  Regular rate rhythm, no murmurs, no rubs, no clicks Abd:  soft, positive bowel sounds, no organomegally, no rebound, no guarding Ext:  2 plus pulses, no edema, no cyanosis, no clubbing Skin:  No rashes no nodules Neuro:  CN II through XII intact, motor grossly intact  EKG - normal sinus rhythm with wandering atrial pacemaker   Assess/Plan:

## 2014-11-16 NOTE — Telephone Encounter (Signed)
New Msg      Pt states there was some confusion about medications today at office visit.    Please call to discuss.

## 2014-11-16 NOTE — Telephone Encounter (Signed)
Patient called and states he is taking Losartan/HCT 160 mg/12.5  I do not see that it comes in that does  Will check with Elberta Leatherwood tomorrow

## 2014-11-16 NOTE — Assessment & Plan Note (Signed)
He denies anginal symptoms. He is encouraged to increase his physical activity.

## 2014-11-17 NOTE — Telephone Encounter (Signed)
Patient called and it is 160/12.5 but Losartan does not come in that strength.  So it has to be Valsartan.  I have asked he call me back with the spelling of medication

## 2014-11-22 MED ORDER — VALSARTAN-HYDROCHLOROTHIAZIDE 160-12.5 MG PO TABS: 1.0000 | ORAL_TABLET | Freq: Every day | ORAL | Status: DC

## 2015-07-20 ENCOUNTER — Ambulatory Visit (INDEPENDENT_AMBULATORY_CARE_PROVIDER_SITE_OTHER): Payer: Medicare Other | Admitting: Ophthalmology

## 2015-07-20 DIAGNOSIS — H43813 Vitreous degeneration, bilateral: Secondary | ICD-10-CM

## 2015-07-20 DIAGNOSIS — I10 Essential (primary) hypertension: Secondary | ICD-10-CM | POA: Diagnosis not present

## 2015-07-20 DIAGNOSIS — H35033 Hypertensive retinopathy, bilateral: Secondary | ICD-10-CM | POA: Diagnosis not present

## 2015-07-20 DIAGNOSIS — H35371 Puckering of macula, right eye: Secondary | ICD-10-CM

## 2015-07-20 DIAGNOSIS — H33303 Unspecified retinal break, bilateral: Secondary | ICD-10-CM

## 2016-07-20 ENCOUNTER — Ambulatory Visit (INDEPENDENT_AMBULATORY_CARE_PROVIDER_SITE_OTHER): Payer: Medicare Other | Admitting: Ophthalmology

## 2017-05-08 ENCOUNTER — Encounter: Payer: Self-pay | Admitting: Internal Medicine

## 2017-05-10 ENCOUNTER — Encounter: Payer: Self-pay | Admitting: Internal Medicine

## 2017-05-10 ENCOUNTER — Ambulatory Visit (INDEPENDENT_AMBULATORY_CARE_PROVIDER_SITE_OTHER): Payer: Medicare Other | Admitting: Internal Medicine

## 2017-05-10 VITALS — BP 152/82 | HR 82 | Ht 67.0 in | Wt 162.6 lb

## 2017-05-10 DIAGNOSIS — I483 Typical atrial flutter: Secondary | ICD-10-CM

## 2017-05-10 NOTE — Progress Notes (Signed)
HPI Dalton Cardenas returns today for follow-up. He is a very pleasant 83 year old man with a history of palpitations and SVT. He underwent EP study and catheter ablation of AV node reentrant tachycardia over 2 years ago. During the procedure, the patient SVT was very difficult to induce, and was only nonsustained. He initially did very well with no recurrent SVT. Several months ago, he began to have some palpitations and approximately 2 weeks ago was seen by Dr. Einar Cardenas where he was found to be in atrial flutter. He was placed on systemic anticoagulation and beta blocker and he spontaneously reverted back to sinus rhythm. He does not think he was out of rhythm for more than a day. He returns today for additional evaluation. He has otherwise felt well and has had no worsening chest pain or shortness of breath. No peripheral edema. He is bothered by tinnitus and thinks that his anticoagulation may be making his tinnitus worse. Allergies  Allergen Reactions  . Amoxicillin     GI upset: Bleeding diarrhea   . Ibuprofen     Bloody diarrhea   . Penicillins     Bloody diarrhea   . Ace Inhibitors     Face swelling   . Atorvastatin     Weakness   . Azithromycin     Stomach ache   . Diltiazem Nausea Only    Upset stomach per patient     Current Outpatient Prescriptions  Medication Sig Dispense Refill  . b complex vitamins tablet Take 1 tablet by mouth daily.    . calcium carbonate (OS-CAL) 600 MG TABS Take 600 mg by mouth daily.    . cholecalciferol (VITAMIN D) 1000 UNITS tablet Take 1,000 Units by mouth daily.    . Coenzyme Q10 (CO Q-10 PO) Take 1 capsule by mouth daily. (200mg )    . cyanocobalamin 1000 MCG tablet Take 1,000 mcg by mouth daily.    Marland Kitchen ELIQUIS 5 MG TABS tablet Take 5 mg by mouth daily.    . folic acid (FOLVITE) 494 MCG tablet Take 800 mcg by mouth daily.      Marland Kitchen L-Arginine 1000 MG TABS Take 1 tablet by mouth daily.     . LUTEIN PO Take 1 tablet by mouth daily.    .  Magnesium 400 MG TABS Take 400 mg by mouth daily.     . metoprolol succinate (TOPROL-XL) 25 MG 24 hr tablet Take 25 mg by mouth daily.    . Probiotic Product (ALIGN PO) Take 1 capsule by mouth daily.     . rosuvastatin (CRESTOR) 10 MG tablet Take 10 mg by mouth daily.      . valsartan-hydrochlorothiazide (DIOVAN HCT) 160-12.5 MG per tablet Take 1 tablet by mouth daily.    . vitamin C (ASCORBIC ACID) 500 MG tablet Take 500 mg by mouth daily.    Marland Kitchen zinc gluconate 50 MG tablet Take 50 mg by mouth daily.      No current facility-administered medications for this visit.      Past Medical History:  Diagnosis Date  . Coronary artery disease    60-70% circumflex (cath in [place] 08/2010).  . Hypercholesterolemia   . Hypertension   . Prostate cancer [facility]    status post prostatectomy  . Sleep apnea   . SVT (supraventricular tachycardia) ([place])    a. Holter monitoring 2011 - AV node reentry, multifocal atrial tachycardia, and resting bradycardia. b. previously tx with Abana in [place].    ROS:  All systems reviewed and negative except as noted in the HPI.   Past Surgical History:  Procedure Laterality Date  . ABLATION OF DYSRHYTHMIC FOCUS  10/13/2014   SVT         by Dr Dalton Cardenas  . ANAL FISSURE REPAIR    . ELECTROPHYSIOLOGY STUDY N/A 10/13/2014   Procedure: ELECTROPHYSIOLOGY STUDY;  Surgeon: Dalton Lance, MD;  Location[facility] CATH LAB;  Service: Cardiovascular;  Laterality: N/A;  . PROSTATECTOMY    . SUPRAVENTRICULAR TACHYCARDIA ABLATION N/A 10/13/2014   Procedure: SUPRAVENTRICULAR TACHYCARDIA ABLATION;  Surgeon: Dalton Lance, MD;  Location: [facility] C[facility] LAB;  Service: Cardiovascular;  Laterality: N/A;     Family History  Problem Relation Age of Onset  . Hypertension Mother   . Heart disease Father   . Hypertension Sister   . Hypertension Brother   . Diabetes Neg Hx   . Coronary artery disease Neg Hx      Social History   Social History  . Marital status: Married    Spouse name:  N/A  . Number of children: 4  . Years of education: N/A   Occupational History  . Retired    Social History Main Topics  . Smoking status: Never Smoker  . Smokeless tobacco: Never Used  . Alcohol use No  . Drug use: No  . Sexual activity: Not on file   Other Topics Concern  . Not on file   Social History Narrative   Regular exercise: Yes     BP (!) 152/82   Pulse 82   Ht 5\' 7"  (1.702 m)   Wt 162 lb 9.6 oz (73.8 kg)   SpO2 99%   BMI 25.47 kg/m   Physical Exam:  Well appearing 83 year old man who looks younger than his stated age, NAD HEENT: Unremarkable Neck:  6 cm JVD, no thyromegally Lymphatics:  No adenopathy Back:  No CVA tenderness Lungs:  Clear, with no wheezes, rales, or rhonchi. HEART:  Regular rate rhythm, no murmurs, no rubs, no clicks Abd:  soft, positive bowel sounds, no organomegally, no rebound, no guarding Ext:  2 plus pulses, no edema, no cyanosis, no clubbing Skin:  No rashes no nodules Neuro:  CN II through XII intact, motor grossly intact  EKG - reviewed from August 3, atrial flutter with a controlled ventricular response   Assess/Plan: 1. Atrial flutter - this is a new diagnosis. He did not undergo atrial flutter ablation in the past. I've discussed the treatment options with the patient in detail. Watchful waiting with continued anticoagulation and beta blocker therapy would be one option for his treatment. A second option would be to undergo catheter ablation with hopes of getting off both medications. Unfortunately, patient's who have atrial flutter and are 83 years old are at risk for ultimately developing atrial fibrillation. I have reviewed all the above with the patient, and he will call us if he would like to pursue catheter ablation. 2. SVT - the patient had inducible nonsustained AV node reentrant tachycardia and this was successfully ablated in January 2016. There is no evidence of any recurrent AV node reentrant tachycardia at this  time. 3. Tinnitus - the patient thinks that his tinnitus has worsened with oral anticoagulation therapy. This may result in his decision to proceed with catheter ablation. Alternatively, a different oral anticoagulant could be tried. I will defer this to Dr. Einar Cardenas.  Cristopher Peru, M.D.

## 2017-05-10 NOTE — Patient Instructions (Signed)
Medication Instructions:  Your physician recommends that you continue on your current medications as directed. Please refer to the Current Medication list given to you today.  Labwork: None ordered.  Testing/Procedures: None ordered.  Follow-Up: Please call our office if you decide to proceed with an aflutter ablation.     Any Other Special Instructions Will Be Listed Below (If Applicable).    If you need a refill on your cardiac medications before your next appointment, please call your pharmacy.

## 2018-02-13 ENCOUNTER — Other Ambulatory Visit: Payer: Self-pay | Admitting: Family Medicine

## 2018-02-13 DIAGNOSIS — R0989 Other specified symptoms and signs involving the circulatory and respiratory systems: Secondary | ICD-10-CM

## 2018-07-02 DIAGNOSIS — M542 Cervicalgia: Secondary | ICD-10-CM | POA: Insufficient documentation

## 2018-10-10 ENCOUNTER — Encounter: Payer: Self-pay | Admitting: Pulmonary Disease

## 2018-10-10 ENCOUNTER — Ambulatory Visit: Payer: Medicare Other | Admitting: Pulmonary Disease

## 2018-10-10 VITALS — BP 132/86 | HR 80 | Ht 67.0 in | Wt 160.0 lb

## 2018-10-10 DIAGNOSIS — G4733 Obstructive sleep apnea (adult) (pediatric): Secondary | ICD-10-CM

## 2018-10-10 DIAGNOSIS — Z9989 Dependence on other enabling machines and devices: Secondary | ICD-10-CM

## 2018-10-10 NOTE — Patient Instructions (Signed)
CPAP supplies will be renewed. Trial of air fit F 30 fullface mask.  Prescription for new CPAP 12 cm will be sent to DME

## 2018-10-10 NOTE — Progress Notes (Signed)
Subjective:    Patient ID: Dalton Cardenas, male    DOB: 04-27-1934, 85 y.o.   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 OSA was diagnosed in 2010 showing moderate severity, initially was on CPAP of 10 cm which was titrated up to 12 cm and he has settled down with a full facemask.  He feels symptomatically improved with this and denies daytime somnolence or fatigue.  He feels better rested when he uses his machine then when he sleeps without it.  His machine is 83 years old and has been giving him problems and he requests a new machine.  He would also like an update on his supplies Epworth sleepiness score is 5. Bedtime is around 11 PM, sleep latency is minimal, he sleeps on his side with one pillow, reports 1-2 nocturnal awakenings spontaneously without any post void sleep latency and is out of bed by 6 AM feeling rested without dryness of mouth or headaches. He naps about 1 to 2 hours daily and naps are refreshing. There is no history suggestive of cataplexy, sleep paralysis or parasomnias  He has been able to lose 7 pounds since his last visit with [place] in 2016 He is very compliant with his CPAP machine by report and denies any problems with mask or pressure.  He brought his machine with him and it appears to be very old, could not obtain a download today.  Past medical history of atrial fibrillation on Eliquis status post ablation and GERD  Significant tests/ events reviewed  PSG 10/2008 reviewed >> AHI 16, predom supine corrected by CPAP 10 cm Later on, adjusted by autoCPAP to 12 cm  Past Medical History:  Diagnosis Date  . Coronary artery disease    60-70% circumflex (cath in [place] 08/2010).  . Hypercholesterolemia   . Hypertension   . Prostate cancer [facility])    status post prostatectomy  . Sleep apnea   . SVT (supraventricular tachycardia) ([place])    a. Holter monitoring 2011 - AV node reentry,  multifocal atrial tachycardia, and resting bradycardia. b. previously tx with Abana in [place].    Past Surgical History:  Procedure Laterality Date  . ABLATION OF DYSRHYTHMIC FOCUS  10/13/2014   SVT         by Dr Lovena Le  . ANAL FISSURE REPAIR    . ELECTROPHYSIOLOGY STUDY N/A 10/13/2014   Procedure: ELECTROPHYSIOLOGY STUDY;  Surgeon: Evans Lance, MD;  Location: [facility] CATH LAB;  Service: Cardiovascular;  Laterality: N/A;  . PROSTATECTOMY    . SUPRAVENTRICULAR TACHYCARDIA ABLATION N/A 10/13/2014   Procedure: SUPRAVENTRICULAR TACHYCARDIA ABLATION;  Surgeon: Evans Lance, MD;  Location: [facility] CATH LAB;  Service: Cardiovascular;  Laterality: N/A;   Allergies  Allergen Reactions  . Amoxicillin     GI upset: Bleeding diarrhea   . Ibuprofen     Bloody diarrhea   . Penicillins     Bloody diarrhea   . Ace Inhibitors     Face swelling   . Atorvastatin     Weakness   . Azithromycin     Stomach ache   . Diltiazem Nausea Only    Upset stomach per patient    Social History   Socioeconomic History  . Marital status: Married    Spouse name: Not on file  . Number of children: 4  . Years of education: Not on file  . Highest education level: Not on file  Occupational History  .  Occupation: Retired  Scientific laboratory technician  . Financial resource strain: Not on file  . Food insecurity:    Worry: Not on file    Inability: Not on file  . Transportation needs:    Medical: Not on file    Non-medical: Not on file  Tobacco Use  . Smoking status: Never Smoker  . Smokeless tobacco: Never Used  Substance and Sexual Activity  . Alcohol use: No  . Drug use: No  . Sexual activity: Not on file  Lifestyle  . Physical activity:    Days per week: Not on file    Minutes per session: Not on file  . Stress: Not on file  Relationships  . Social connections:    Talks on phone: Not on file    Gets together: Not on file    Attends religious service: Not on file    Active member of club or organization: Not  on file    Attends meetings of clubs or organizations: Not on file    Relationship status: Not on file  . Intimate partner violence:    Fear of current or ex partner: Not on file    Emotionally abused: Not on file    Physically abused: Not on file    Forced sexual activity: Not on file  Other Topics Concern  . Not on file  Social History Narrative   Regular exercise: Yes     Family History  Problem Relation Age of Onset  . Hypertension Mother   . Heart disease Father   . Hypertension Sister   . Hypertension Brother   . Diabetes Neg Hx   . Coronary artery disease Neg Hx     Review of Systems Constitutional: negative for anorexia, fevers and sweats  Eyes: negative for irritation, redness and visual disturbance  Ears, nose, mouth, throat, and face: negative for earaches, epistaxis, nasal congestion and sore throat  Respiratory: negative for cough, dyspnea on exertion, sputum and wheezing  Cardiovascular: negative for chest pain, dyspnea, lower extremity edema, orthopnea, palpitations and syncope  Gastrointestinal: negative for abdominal pain, constipation, diarrhea, melena, nausea and vomiting  Genitourinary:negative for dysuria, frequency and hematuria  Hematologic/lymphatic: negative for bleeding, easy bruising and lymphadenopathy  Musculoskeletal:negative for arthralgias, muscle weakness and stiff joints  Neurological: negative for coordination problems, gait problems, headaches and weakness  Endocrine: negative for diabetic symptoms including polydipsia, polyuria and weight loss      Objective:   Physical Exam  Gen. Pleasant, well-nourished, in no distress, normal affect ENT - no pallor,icterus, no post nasal drip Neck: No JVD, no thyromegaly, no carotid bruits Lungs: no use of accessory muscles, no dullness to percussion, clear without rales or rhonchi  Cardiovascular: Rhythm regular, heart sounds  normal, no murmurs or gallops, no peripheral edema Abdomen: soft and  non-tender, no hepatosplenomegaly, BS normal. Musculoskeletal: No deformities, no cyanosis or clubbing Neuro:  alert, non focal       Assessment & Plan:

## 2018-10-10 NOTE — Assessment & Plan Note (Addendum)
CPAP supplies will be renewed. Trial of air fit F 30 fullface mask.  Prescription for new CPAP 12 cm will be sent to DME Given weight loss will also set up home sleep study to reassess   compliance with goal of at least 4-6 hrs every night is the expectation. Advised against medications with sedative side effects Cautioned against driving when sleepy - understanding that sleepiness will vary on a day to day basis

## 2018-10-13 NOTE — Addendum Note (Signed)
Addended by: Valerie Salts on: 10/13/2018 12:01 PM   Modules accepted: Orders

## 2019-04-15 ENCOUNTER — Telehealth: Payer: Self-pay

## 2019-04-16 ENCOUNTER — Ambulatory Visit: Payer: Self-pay | Admitting: Cardiology

## 2019-05-22 ENCOUNTER — Other Ambulatory Visit: Payer: Self-pay | Admitting: Cardiology

## 2019-05-22 DIAGNOSIS — I10 Essential (primary) hypertension: Secondary | ICD-10-CM

## 2019-06-24 NOTE — Telephone Encounter (Signed)
Pt hasn't returned our call

## 2019-08-20 ENCOUNTER — Other Ambulatory Visit: Payer: Self-pay | Admitting: Cardiology

## 2019-08-20 DIAGNOSIS — I10 Essential (primary) hypertension: Secondary | ICD-10-CM

## 2019-10-22 ENCOUNTER — Other Ambulatory Visit: Payer: Self-pay

## 2019-10-22 ENCOUNTER — Ambulatory Visit: Payer: Medicare Other | Admitting: Cardiology

## 2019-10-22 ENCOUNTER — Encounter: Payer: Self-pay | Admitting: Cardiology

## 2019-10-22 VITALS — BP 152/65 | HR 60 | Temp 97.7°F | Ht 67.0 in | Wt 159.0 lb

## 2019-10-22 DIAGNOSIS — I483 Typical atrial flutter: Secondary | ICD-10-CM

## 2019-10-22 DIAGNOSIS — R001 Bradycardia, unspecified: Secondary | ICD-10-CM | POA: Diagnosis not present

## 2019-10-22 DIAGNOSIS — I10 Essential (primary) hypertension: Secondary | ICD-10-CM

## 2019-10-22 DIAGNOSIS — E7800 Pure hypercholesterolemia, unspecified: Secondary | ICD-10-CM

## 2019-10-22 MED ORDER — HYDRALAZINE HCL 25 MG PO TABS
25.0000 mg | ORAL_TABLET | Freq: Three times a day (TID) | ORAL | 6 refills | Status: DC | PRN
Start: 2019-10-22 — End: 2020-06-13

## 2019-10-22 NOTE — Progress Notes (Signed)
Primary Physician/Referring:  Leighton Ruff, MD  Patient ID: Dalton Cardenas, male    DOB: 1934-02-14, 86 y.o.   MRN: [id number]  Chief Complaint  Patient presents with  . Bradycardia  . SVT  . Hypertension  . Hyperlipidemia  . Follow-up    A-Flutter   HPI:    Dalton Cardenas  is a 86 y.o.  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 Cristopher Peru and recommended either ablation or continued medical therapy. Patient decided to do medical therapy for now. He presents for annual visit.  Presently tolerating Eliquis without bleeding diathesis.  Could not tolerate beta-blockers in the past due to marked bradycardia but is on low-dose due to presence of atrial flutter with RVR, presently tolerating metoprolol succinate 25 mg twice daily.  Has asymptomatic underlying brief Mobitz 2 AV block with high-dose beta-blocker therapy.  It has been a year since last time, he called our office to make an appointment due to palpitations.  He has had occasional episodes of palpitation but 3 days ago he had similar symptoms of palpitations lasting several hours associated with dizziness and also fatigue.  This resolved at the end of the day.  Past Medical History:  Diagnosis Date  . Coronary artery disease    60-70% circumflex (cath in [place] 08/2010).  . Hypercholesterolemia   . Hypertension   . Prostate cancer [facility])    status post prostatectomy  . Sleep apnea   . SVT (supraventricular tachycardia) ([place])    a. Holter monitoring 2011 - AV node reentry, multifocal atrial tachycardia, and resting bradycardia. b. previously tx with [place] in [place].   Past Surgical History:  Procedure Laterality Date  . ABLATION OF DYSRHYTHMIC FOCUS  10/13/2014   SVT         by Dr Lovena Le  . ANAL FISSURE REPAIR    . ELECTROPHYSIOLOGY STUDY N/A 10/13/2014   Procedure: ELECTROPHYSIOLOGY  STUDY;  Surgeon: Evans Lance, MD;  Location: [facility] CATH LAB;  Service: Cardiovascular;  Laterality: N/A;  . PROSTATECTOMY    . SUPRAVENTRICULAR TACHYCARDIA ABLATION N/A 10/13/2014   Procedure: SUPRAVENTRICULAR TACHYCARDIA ABLATION;  Surgeon: Evans Lance, MD;  Location: [facility] CATH LAB;  Service: Cardiovascular;  Laterality: N/A;   Social History   Tobacco Use  . Smoking status: Never Smoker  . Smokeless tobacco: Never Used  Substance Use Topics  . Alcohol use: No   ROS  Review of Systems  Cardiovascular: Positive for palpitations. Negative for dyspnea on exertion and leg swelling.  Gastrointestinal: Negative for melena.  Neurological: Positive for light-headedness and vertigo.   Objective  Blood pressure (!) 152/65, pulse 60, temperature 97.7 F (36.5 C), height 5\' 7"  (1.702 m), weight 159 lb (72.1 kg), SpO2 98 %.  Vitals with BMI 10/22/2019 10/22/2019 10/10/2018  Height - 5\' 7"  5\' 7"   Weight - 159 lbs 160 lbs  BMI - 99991111 [id number]  Systolic 0000000 Q000111Q Q000111Q  Diastolic 65 68 86  Pulse 60 60 80     Physical Exam  Neck: No thyromegaly present.  Cardiovascular: Normal rate, regular rhythm, normal heart sounds and intact distal pulses. Exam reveals no gallop.  No murmur heard. No leg edema, no JVD.  Pulmonary/Chest: Effort normal and breath sounds normal.  Abdominal: Soft. Bowel sounds are normal.  Musculoskeletal:     Cervical back: Neck supple.  Skin: Skin is warm and dry.  Laboratory examination:   No results for input(s): NA, K, CL, CO2, GLUCOSE, BUN, CREATININE, CALCIUM, GFRNONAA, GFRAA in the last 8760 hours. CrCl cannot be calculated (Patient's most recent lab result is older than the maximum 21 days allowed.).  CMP Latest Ref Rng & Units 10/07/2014 07/22/2012  Glucose 70 - 99 mg/dL 109(H) 120(H)  BUN 6 - 23 mg/dL 18 11  Creatinine 0.40 - 1.50 mg/dL 0.95 0.93  Sodium 135 - 145 mEq/L 139 136  Potassium 3.5 - 5.1 mEq/L 4.2 3.4(L)  Chloride 96 - 112 mEq/L 105 98  CO2 19 - 32 mEq/L  27 28  Calcium 8.4 - 10.5 mg/dL 9.3 9.4   CBC Latest Ref Rng & Units 10/07/2014 07/22/2012  WBC 4.0 - 10.5 K/uL 6.8 10.3  Hemoglobin 13.0 - 17.0 g/dL 13.3 14.3  Hematocrit 39.0 - 52.0 % 38.5(L) 40.7  Platelets 150.0 - 400.0 K/uL 224.0 213   Lipid Panel  No results found for: CHOL, TRIG, HDL, CHOLHDL, VLDL, LDLCALC, LDLDIRECT HEMOGLOBIN A1C No results found for: HGBA1C, MPG TSH No results for input(s): TSH in the last 8760 hours.  External labs 07/06/2019:   Cholesterol, total 281.000 07/06/2019 HDL 42.000 07/06/2019 LDL 206.000 07/06/2019 Triglycerides 168.000 07/06/2019 A1C 5.800 07/06/2019 Hemoglobin 12.800 07/06/2019 Creatinine, Serum 1.140 07/06/2019 Potassium 4.200 07/06/2019 Magnesium N/D ALT (SGPT) 12.000 07/06/2019  Medications and allergies   Allergies  Allergen Reactions  . Amoxicillin     GI upset: Bleeding diarrhea  Other reaction(s): GI Bleed  . Ibuprofen     Bloody diarrhea   . Penicillins     Bloody diarrhea   . Ace Inhibitors     Face swelling   . Atorvastatin     Weakness   . Azithromycin     Stomach ache   . Diltiazem Nausea Only    Upset stomach per patient     Current Outpatient Medications  Medication Instructions  . amLODipine (NORVASC) 5 mg, Oral, Daily  . b complex vitamins tablet 1 tablet, Daily  . cholecalciferol (VITAMIN D) 1,000 Units, Daily  . Coenzyme Q10 (CO Q-10 PO) 1 capsule, Oral, Daily, (200mg )  . cyanocobalamin 1,000 mcg, Daily  . Eliquis 5 mg, Oral, 2 times daily  . folic acid (FOLVITE) Q000111Q mcg, Daily  . hydrALAZINE (APRESOLINE) 25 mg, Oral, 3 times daily PRN, Take one if BP >140/70, 2 tab if SBP>160 mm Hg  . L-Arginine 1000 MG TABS 1 tablet, Oral, Daily  . LUTEIN PO 1 tablet, Oral, Daily  . Magnesium 400 mg, Oral, Daily  . metoprolol succinate (TOPROL-XL) 25 MG 24 hr tablet TAKE 1 TABLET BY MOUTH TWICE DAILY AS DIRECTED  . Probiotic Product (ALIGN PO) 1 capsule, Daily  . rosuvastatin (CRESTOR) 10 mg, Daily  .  valsartan-hydrochlorothiazide (DIOVAN-HCT) 160-12.5 MG tablet TAKE 1 TABLET BY MOUTH ONCE DAILY IN THE MORNING  . vitamin C (ASCORBIC ACID) 500 mg, Daily  . zinc gluconate 50 mg, Oral, Daily   Radiology:  No results found.  Cardiac Studies:   Coronary Angiogram  [2011]: Performed in [place], 70% stenosis in the circumflex coronary artery. Was performed as a part of workup for EP study for the SVT.  Nuclear stress test  [04/26/2010]: Myocardial perfusion scan 04/26/2010: Normal perfusion, EF 77%.  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  Echocardiogram 05/14/2017: Left ventricle cavity is normal in size. Moderate asymmetric hypertrophy of  the left ventricle. Basal septum measures at 2.0 cm Hyperdynamic LV with functional LVOT peak gradient 5 mmHg without SAM or LVOT obstruction Visual LVEF 65-70%. Left atrial cavity is moderately dilated. AP measurement 4.4 cm. 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 Mild mitral and tricuspid regurgitation. Pulmonary artery systolic pressure 123456 mmhg. No significant change compared to prior study dated 02/16/2016  Assessment     ICD-10-CM   1. Typical atrial flutter (HCC)  I48.3   2. Bradycardia by electrocardiogram  R00.1   3. Essential hypertension, benign  I10 EKG 12-Lead    hydrALAZINE (APRESOLINE) 25 MG tablet  4. Hypercholesteremia  E78.00 Lipid Panel With LDL/HDL Ratio    Lipid Panel With LDL/HDL Ratio    EKG 10/22/2019: Normal sinus rhythm at rate of 68 bpm, PR interval 200 ms.  Leftward enlargement.  Left axis deviation, left anterior fascicular block.  LVH.    EKG 10/16/2018: Sinus rhythm with first-degree AV block at the rate is 57 bpm, left axis deviation, left ant fascicular block.  Poor R-wave progression, cannot exclude anteroseptal infarct old.  Normal QT  interval.  Meds ordered this encounter  Medications  . hydrALAZINE (APRESOLINE) 25 MG tablet    Sig: Take 1 tablet (25 mg total) by mouth 3 (three) times daily as needed. Take one if BP >140/70, 2 tab if SBP>160 mm Hg    Dispense:  90 tablet    Refill:  6    Medications Discontinued During This Encounter  Medication Reason  . calcium carbonate (OS-CAL) 600 MG TABS Error    Recommendations:   ADRIENNE WINDERS  is a 86 y.o. 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 Cristopher Peru and recommended either ablation or continued medical therapy. Patient decided to do medical therapy for now. He presents for annual visit.  Presently tolerating Eliquis without bleeding diathesis.  Could not tolerate beta-blockers in the past due to marked bradycardia but is on low-dose due to presence of atrial flutter with RVR, presently tolerating metoprolol succinate 25 mg twice daily.  Has asymptomatic underlying brief Mobitz 2 AV block with high-dose beta-blocker therapy.  He has been having occasional episodes of palpitations, although is today in sinus rhythm, we can discuss regarding possibility of atrial flutter ablation and possible coming off of anticoagulation.  Patient wants to think about this, he will continue with present medications.  He has discontinued Crestor, advised him that in view of coronary atherosclerosis and also to prevent future cardiac events and vascular events including stroke that he should be back on statins.  Blood pressure was elevated today, start him on hydralazine and advised him to use it as needed for systolic blood pressure 0000000 mmHg.  Otherwise he stable, I will see him back in 6 months or sooner if problems.  I will obtain lipid profile in 4 to 6 weeks for follow-up.  Adrian Prows, MD, [facility] 10/24/2019, 9:31 PM [facility] [place] Office:  [phone]

## 2019-10-24 ENCOUNTER — Encounter: Payer: Self-pay | Admitting: Cardiology

## 2019-11-16 ENCOUNTER — Telehealth: Payer: Self-pay | Admitting: Pulmonary Disease

## 2019-11-16 DIAGNOSIS — G4733 Obstructive sleep apnea (adult) (pediatric): Secondary | ICD-10-CM

## 2019-11-16 NOTE — Telephone Encounter (Signed)
Okay to send order. 

## 2019-11-16 NOTE — Telephone Encounter (Signed)
Order placed for cpap supplies as requested.   Spoke with pt, aware of recs.  Nothing further needed at this time- will close encounter.

## 2019-11-16 NOTE — Telephone Encounter (Signed)
  Spoke with pt, he is requesting CPAP supplies order to be sent to [facility]. He made an appt to see RA on 12/08/2019 at 3:30. RA please advise if its ok to send order.   Assessment & Plan Note by Rigoberto Noel, MD at 10/10/2018 5:07 PM Author: Rigoberto Noel, MD Author Type: Physician Filed: 10/10/2018 5:08 PM  Note Status: [place] List: Cosign Not Required Encounter Date: 10/10/2018  Problem: Obstructive sleep apnea  Editor: Rigoberto Noel, MD (Physician)  Prior Versions: 1. Rigoberto Noel, MD (Physician) at 10/10/2018 5:07 PM - Written    CPAP supplies will be renewed. Trial of air fit F 30 fullface mask.  Prescription for new CPAP 12 cm will be sent to DME Given weight loss will also set up home sleep study to reassess   compliance with goal of at least 4-6 hrs every night is the expectation. Advised against medications with sedative side effects Cautioned against driving when sleepy - understanding that sleepiness will vary on a day to day basis

## 2019-11-17 ENCOUNTER — Telehealth: Payer: Self-pay | Admitting: Pulmonary Disease

## 2019-11-17 NOTE — Telephone Encounter (Signed)
This is the response from [employer] for the DME order placed for cpap supplies:  Elon Alas sent to Angelina Sheriff, Rachell Cipro, [place], he is not a current [facility] patient. If he was formerly with [facility], he did not do business within 1-2 years before acquisition and documents and account did not transition to [employer]. He will now be considered a new PAP patient and we will need the following:  -Baseline sleep study w/signed interpreation (did not see in Epic)  -Current office visit notes that speak of patients usage and benefit of PAP therapy. (last notes are from 09/2018 which are too old).   Please schedule the patient for an OV and if needed a new sleep study if this is not available.   **MELISSA: Please put in a Triage message.   Thank you  Sonia Baller

## 2019-11-17 NOTE — Telephone Encounter (Signed)
Pt is currently scheduled for an appt with RA 3/23.  Called and spoke with pt letting him know the info stated in the message from [place], [facility]. I have moved pt's appt from 3/23 to 3/11 with RA. Nothing further needed.

## 2019-11-18 ENCOUNTER — Other Ambulatory Visit: Payer: Self-pay | Admitting: Cardiology

## 2019-11-18 DIAGNOSIS — I10 Essential (primary) hypertension: Secondary | ICD-10-CM

## 2019-11-26 ENCOUNTER — Ambulatory Visit (INDEPENDENT_AMBULATORY_CARE_PROVIDER_SITE_OTHER): Payer: Medicare Other

## 2019-11-26 ENCOUNTER — Encounter: Payer: Self-pay | Admitting: Pulmonary Disease

## 2019-11-26 ENCOUNTER — Other Ambulatory Visit: Payer: Self-pay

## 2019-11-26 ENCOUNTER — Ambulatory Visit: Payer: Medicare Other | Admitting: Pulmonary Disease

## 2019-11-26 VITALS — BP 126/74 | HR 60 | Temp 97.1°F | Ht 67.0 in | Wt 162.4 lb

## 2019-11-26 DIAGNOSIS — R053 Chronic cough: Secondary | ICD-10-CM

## 2019-11-26 DIAGNOSIS — G4733 Obstructive sleep apnea (adult) (pediatric): Secondary | ICD-10-CM

## 2019-11-26 DIAGNOSIS — R05 Cough: Secondary | ICD-10-CM | POA: Diagnosis not present

## 2019-11-26 NOTE — Progress Notes (Signed)
   Subjective:    Patient ID: Dalton Cardenas, male    DOB: Jul 02, 1934, 86 y.o.   MRN: [id number]  HPI 86 year old never smoker follow-up of moderate OSA.  PMH- atrial fibrillation on Eliquis status post ablation and GERD  His current machine is over 5 years old, on prior download we have adjusted by auto CPAP to 12 cm.  He has been comfortable on this with full facemask He does report pressure sore on the bridge of his nose.  No ST card available for download today.  He also reports a chronic cough, productive of minimal white sputum in the morning.  About 2 weeks ago he had mild hemoptysis, he discussed this with his cardiologist, continues on Eliquis.  No further episodes since then.  He also had an episode of epistaxis in the past No URI episodes. He has been vaccinated for Covid  He reports mild chest tightness occasionally which occurs at rest, relieved spontaneously in 30 to 50 minutes.  He exercises daily and does not report any exertional angina  Significant tests/ events reviewed  PSG 10/2008 reviewed >> AHI 16, predom supine corrected by CPAP 10 cm Later on, adjusted by autoCPAP to 12 cm  Review of Systems Patient denies significant dyspnea,cough, hemoptysis,  chest pain, palpitations, pedal edema, orthopnea, paroxysmal nocturnal dyspnea, lightheadedness, nausea, vomiting, abdominal or  leg pains      Objective:   Physical Exam  Gen. Pleasant, well-nourished, elderly in no distress ENT - no thrush, no pallor/icterus,no post nasal drip Neck: No JVD, no thyromegaly, no carotid bruits Lungs: no use of accessory muscles, no dullness to percussion, left basal rales no rhonchi  Cardiovascular: Rhythm regular, heart sounds  normal, no murmurs or gallops, no peripheral edema Musculoskeletal: No deformities, no cyanosis or clubbing        Assessment & Plan:

## 2019-11-26 NOTE — Assessment & Plan Note (Signed)
Due to episode of hemoptysis 2 weeks ago, obtain chest x-ray which does not show any new infiltrates or effusions. This was likely related to Eliquis No further work-up at this time unless recurrent

## 2019-11-26 NOTE — Assessment & Plan Note (Addendum)
Moderate, we discussed the association with atrial fibrillation. Proceed with home sleep test to reassess and following this we will provide him with new auto CPAP machine He is very compliant and CPAP is certainly helped improve his daytime somnolence and fatigue and even his GERD symptoms   Advised against medications with sedative side effects Cautioned against driving when sleepy - understanding that sleepiness will vary on a day to day basis  For some reason DME is raising issues-I feel this is all related to transfer of information rather than anything related to patient care.  If they are not able to provide him supplies, we will have him change DME's I also discussed providing him with new AirFit F30 full facemask

## 2019-11-26 NOTE — Patient Instructions (Signed)
  Schedule home sleep test. Based on this we will get you new CPAP machine

## 2019-12-08 ENCOUNTER — Ambulatory Visit: Payer: Medicare Other | Admitting: Pulmonary Disease

## 2019-12-13 ENCOUNTER — Other Ambulatory Visit: Payer: Self-pay | Admitting: Cardiology

## 2019-12-14 ENCOUNTER — Ambulatory Visit: Payer: Medicare Other

## 2019-12-14 ENCOUNTER — Other Ambulatory Visit: Payer: Self-pay

## 2019-12-14 DIAGNOSIS — G4733 Obstructive sleep apnea (adult) (pediatric): Secondary | ICD-10-CM | POA: Diagnosis not present

## 2019-12-15 ENCOUNTER — Telehealth: Payer: Self-pay | Admitting: Pulmonary Disease

## 2019-12-15 DIAGNOSIS — G4733 Obstructive sleep apnea (adult) (pediatric): Secondary | ICD-10-CM

## 2019-12-15 NOTE — Telephone Encounter (Signed)
Discussed HST results >> mild OSA , mainly supine, 8/h  Please send Rx for new autoCPAP 8-12 cm Download in 1 month

## 2019-12-15 NOTE — Telephone Encounter (Signed)
Order placed. Nothing further needed. 

## 2019-12-30 ENCOUNTER — Other Ambulatory Visit: Payer: Self-pay | Admitting: Cardiology

## 2020-02-21 ENCOUNTER — Other Ambulatory Visit: Payer: Self-pay | Admitting: Cardiology

## 2020-02-21 DIAGNOSIS — I10 Essential (primary) hypertension: Secondary | ICD-10-CM

## 2020-03-25 DIAGNOSIS — I83893 Varicose veins of bilateral lower extremities with other complications: Secondary | ICD-10-CM

## 2020-03-25 DIAGNOSIS — R600 Localized edema: Secondary | ICD-10-CM

## 2020-03-28 NOTE — Telephone Encounter (Signed)
ICD-10-CM   1. Bilateral leg edema  R60.0 Ambulatory referral to Interventional Radiology  2. Varicose veins of leg with edema, bilateral  O73.085 Ambulatory referral to Interventional Radiology  Patient has noticed marked leg edema, sometimes even forming blisters.  Will refer him to be evaluated for bilateral venous insufficiency and varicose veins.   Adrian Prows, MD, [facility] 03/28/2020, 2:04 PM Office: [phone]

## 2020-04-04 ENCOUNTER — Encounter: Payer: Self-pay | Admitting: Cardiology

## 2020-04-19 ENCOUNTER — Ambulatory Visit: Payer: Medicare Other | Admitting: Cardiology

## 2020-04-19 ENCOUNTER — Other Ambulatory Visit: Payer: Self-pay | Admitting: Cardiology

## 2020-04-20 ENCOUNTER — Ambulatory Visit: Payer: Medicare Other | Admitting: Cardiology

## 2020-05-02 ENCOUNTER — Ambulatory Visit: Payer: Medicare Other | Admitting: Cardiology

## 2020-05-02 ENCOUNTER — Other Ambulatory Visit: Payer: Self-pay

## 2020-05-02 ENCOUNTER — Encounter: Payer: Self-pay | Admitting: Cardiology

## 2020-05-02 VITALS — BP 160/80 | HR 69 | Resp 15 | Ht 67.0 in | Wt 161.0 lb

## 2020-05-02 DIAGNOSIS — Z8679 Personal history of other diseases of the circulatory system: Secondary | ICD-10-CM

## 2020-05-02 DIAGNOSIS — I10 Essential (primary) hypertension: Secondary | ICD-10-CM

## 2020-05-02 DIAGNOSIS — I351 Nonrheumatic aortic (valve) insufficiency: Secondary | ICD-10-CM

## 2020-05-02 DIAGNOSIS — R600 Localized edema: Secondary | ICD-10-CM

## 2020-05-02 DIAGNOSIS — R0989 Other specified symptoms and signs involving the circulatory and respiratory systems: Secondary | ICD-10-CM

## 2020-05-02 MED ORDER — SPIRONOLACTONE 25 MG PO TABS: 25.0000 mg | ORAL_TABLET | Freq: Every day | ORAL | 3 refills | Status: DC

## 2020-05-02 MED ORDER — SPIRONOLACTONE 25 MG PO TABS: 25.0000 mg | ORAL_TABLET | Freq: Every day | ORAL | 2 refills | Status: DC

## 2020-05-02 MED ORDER — VALSARTAN 160 MG PO TABS: 160.0000 mg | ORAL_TABLET | Freq: Every day | ORAL | 3 refills | Status: DC

## 2020-05-02 NOTE — Progress Notes (Signed)
Primary Physician/Referring:  Leighton Ruff, MD  Patient ID: Dalton Cardenas, male    DOB: 02/07/1934, 86 y.o.   MRN: [id number]  Chief Complaint  Patient presents with  . Follow-up    6 month  . Atrial Flutter  . Hypertension   HPI:    Dalton Cardenas  is a 86 y.o.  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 Cristopher Peru and recommended either ablation or continued medical therapy. Patient decided to do medical therapy for now. He presents for 6 month OV.    Presently tolerating Eliquis without bleeding diathesis.  Could not tolerate beta-blockers in the past due to marked bradycardia but is on low-dose due to presence of atrial flutter with RVR, presently tolerating metoprolol succinate 25 mg twice daily.  Has asymptomatic underlying brief Mobitz 2 AV block with high-dose beta-blocker therapy.  Since being on Diovan HCT, he has noticed photophobia.  Also since discontinuing amlodipine, his leg edema has completely resolved.  I had made a referral to radiology for evaluation for venous insufficiency but this never happened.  Otherwise no other specific symptoms today.  Continues to have occasional palpitations.  Past Medical History:  Diagnosis Date  . Coronary artery disease    60-70% circumflex (cath in [place] 08/2010).  . Hypercholesterolemia   . Hypertension   . Prostate cancer [facility])    status post prostatectomy  . Sleep apnea   . SVT (supraventricular tachycardia) ([place])    a. Holter monitoring 2011 - AV node reentry, multifocal atrial tachycardia, and resting bradycardia. b. previously tx with Abana in [place].   Past Surgical History:  Procedure Laterality Date  . ABLATION OF DYSRHYTHMIC FOCUS  10/13/2014   SVT         by Dr Lovena Le  . ANAL FISSURE REPAIR    . ELECTROPHYSIOLOGY STUDY N/A 10/13/2014   Procedure: ELECTROPHYSIOLOGY STUDY;   Surgeon: Evans Lance, MD;  Location: [facility] CATH LAB;  Service: Cardiovascular;  Laterality: N/A;  . PROSTATECTOMY    . SUPRAVENTRICULAR TACHYCARDIA ABLATION N/A 10/13/2014   Procedure: SUPRAVENTRICULAR TACHYCARDIA ABLATION;  Surgeon: Evans Lance, MD;  Location: [facility] CATH LAB;  Service: Cardiovascular;  Laterality: N/A;   Social History   Tobacco Use  . Smoking status: Never Smoker  . Smokeless tobacco: Never Used  Substance Use Topics  . Alcohol use: No   ROS  Review of Systems  Eyes: Positive for photophobia.  Cardiovascular: Positive for palpitations. Negative for dyspnea on exertion and leg swelling.  Gastrointestinal: Negative for melena.  Neurological: Positive for light-headedness and vertigo.   Objective  Blood pressure (!) 160/80, pulse 69, resp. rate 15, height 5\' 7"  (1.702 m), weight 161 lb (73 kg), SpO2 99 %.  Vitals with BMI 05/02/2020 11/26/2019 10/22/2019  Height 5\' 7"  5\' 7"  -  Weight 161 lbs 162 lbs 6 oz -  BMI 92.42 68.34 -  Systolic 196 222 979  Diastolic 80 74 65  Pulse 69 60 60     Physical Exam Neck:     Thyroid: No thyromegaly.  Cardiovascular:     Rate and Rhythm: Normal rate and regular rhythm.     Pulses: Intact distal pulses.     Heart sounds: Normal heart sounds. No murmur heard.  No gallop.      Comments: No leg edema, no JVD. Pulmonary:     Effort: Pulmonary effort  is normal.     Breath sounds: Normal breath sounds.  Abdominal:     General: Bowel sounds are normal.     Palpations: Abdomen is soft.  Musculoskeletal:     Cervical back: Neck supple.  Skin:    General: Skin is warm and dry.    Laboratory examination:   No results for input(s): NA, K, CL, CO2, GLUCOSE, BUN, CREATININE, CALCIUM, GFRNONAA, GFRAA in the last 8760 hours. CrCl cannot be calculated (Patient's most recent lab result is older than the maximum 21 days allowed.).  CMP Latest Ref Rng & Units 10/07/2014 07/22/2012  Glucose 70 - 99 mg/dL 109(H) 120(H)  BUN 6 - 23 mg/dL 18  11  Creatinine 0.40 - 1.50 mg/dL 0.95 0.93  Sodium 135 - 145 mEq/L 139 136  Potassium 3.5 - 5.1 mEq/L 4.2 3.4(L)  Chloride 96 - 112 mEq/L 105 98  CO2 19 - 32 mEq/L 27 28  Calcium 8.4 - 10.5 mg/dL 9.3 9.4   CBC Latest Ref Rng & Units 10/07/2014 07/22/2012  WBC 4.0 - 10.5 K/uL 6.8 10.3  Hemoglobin 13.0 - 17.0 g/dL 13.3 14.3  Hematocrit 39 - 52 % 38.5(L) 40.7  Platelets 150 - 400 K/uL 224.0 213   Lipid Panel  No results found for: CHOL, TRIG, HDL, CHOLHDL, VLDL, LDLCALC, LDLDIRECT HEMOGLOBIN A1C No results found for: HGBA1C, MPG TSH No results for input(s): TSH in the last 8760 hours.  External labs 07/06/2019:   Cholesterol, total 189.000 01/25/2020 HDL 46.000 01/25/2020 LDL-C 123.000 01/25/2020 Triglycerides 108.000 01/25/2020  A1C 6.300 01/25/2020 TSH 4.640 01/25/2020   Hemoglobin 12.800 01/25/2020  Creatinine, Serum 1.150 01/25/2020 Potassium 4.100 01/25/2020 Magnesium N/D ALT (SGPT) 20.000 01/25/2020   Cholesterol, total 281.000 07/06/2019 HDL 42.000 07/06/2019 LDL 206.000 07/06/2019 Triglycerides 168.000 07/06/2019 A1C 5.800 07/06/2019 Hemoglobin 12.800 07/06/2019 Creatinine, Serum 1.140 07/06/2019 Potassium 4.200 07/06/2019 Magnesium N/D ALT (SGPT) 12.000 07/06/2019  Medications and allergies   Allergies  Allergen Reactions  . Amoxicillin     GI upset: Bleeding diarrhea  Other reaction(s): GI Bleed  . Ibuprofen     Bloody diarrhea   . Penicillins     Bloody diarrhea   . Ace Inhibitors     Face swelling   . Amlodipine Swelling  . Atorvastatin     Weakness   . Azithromycin     Stomach ache   . Diltiazem Nausea Only    Upset stomach per patient  . Hydrochlorothiazide Other (See Comments)    Photosensitivity     Current Outpatient Medications  Medication Instructions  . b complex vitamins tablet 1 tablet, Daily  . cholecalciferol (VITAMIN D) 1,000 Units, Daily  . Coenzyme Q10 (CO Q-10 PO) 1 capsule, Oral, Daily, (200mg )  . cyanocobalamin 1,000  mcg, Daily  . ELIQUIS 5 MG TABS tablet Take 1 tablet by mouth twice daily  . folic acid (FOLVITE) 026 mcg, Daily  . hydrALAZINE (APRESOLINE) 25 mg, Oral, 3 times daily PRN, Take one if BP >140/70, 2 tab if SBP>160 mm Hg  . L-Arginine 1000 MG TABS 1 tablet, Oral, Daily  . LUTEIN PO 1 tablet, Oral, Daily  . Magnesium 400 mg, Oral, Daily  . metoprolol succinate (TOPROL-XL) 25 MG 24 hr tablet TAKE 1 TABLET BY MOUTH TWICE DAILY AS DIRECTED  . Probiotic Product (ALIGN PO) 1 capsule, Daily  . rosuvastatin (CRESTOR) 10 mg, Daily  . spironolactone (ALDACTONE) 25 mg, Oral, Daily  . valsartan (DIOVAN) 160 mg, Oral, Daily  . vitamin C (ASCORBIC ACID) 500 mg,  Daily  . zinc gluconate 50 mg, Oral, Daily   Radiology:  No results found.  Cardiac Studies:   Coronary Angiogram  [2011]: Performed in [place], 70% stenosis in the circumflex coronary artery. Was performed as a part of workup for EP study for the SVT.  Nuclear stress test  [04/26/2010]: Myocardial perfusion scan 04/26/2010: Normal perfusion, EF 77%.  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  Echocardiogram 05/14/2017: Left ventricle cavity is normal in size. Moderate asymmetric hypertrophy of the left ventricle. Basal septum measures at 2.0 cm Hyperdynamic LV with functional LVOT peak gradient 5 mmHg without SAM or LVOT obstruction Visual LVEF 65-70%. Left atrial cavity is moderately dilated. AP measurement 4.4 cm. 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 Mild mitral and tricuspid regurgitation. Pulmonary artery systolic pressure 67-34 mmhg. No significant change compared to prior study dated 02/16/2016  EKG:   EKG 05/02/2020: Sinus rhythm with first-degree AV block at rate of 71 bpm, left atrial enlargement, left axis deviation, left anterior fascicular block.  Poor R  wave progression, cannot exclude anteroseptal infarct old.  No evidence of ischemia.  No significant change from 10/22/2019.  Assessment     ICD-10-CM   1. Essential hypertension, benign  I10 valsartan (DIOVAN) 160 MG tablet    Basic metabolic panel    Basic metabolic panel    PCV ECHOCARDIOGRAM COMPLETE    spironolactone (ALDACTONE) 25 MG tablet    DISCONTINUED: spironolactone (ALDACTONE) 25 MG tablet  2. S/P ablation of atrial flutter  Z98.890 EKG 12-Lead   Z86.79   3. Bilateral leg edema  R60.0   4. Bilateral carotid bruits  R09.89 PCV CAROTID DUPLEX (BILATERAL)  5. Aortic ejection murmur  I35.1 PCV ECHOCARDIOGRAM COMPLETE    CHA2DS2-VASc Score is 4.  Yearly risk of stroke: 4.8% (A, HTN, Vasc Dz).  Score of 1=0.6; 2=2.2; 3=3.2; 4=4.8; 5=7.2; 6=9.8; 7=>9.8) -(CHF; HTN; vasc disease DM,  Male = 1; Age <65 =0; 65-74 = 1,  >75 =2; stroke/embolism= 2).    Meds ordered this encounter  Medications  . valsartan (DIOVAN) 160 MG tablet    Sig: Take 1 tablet (160 mg total) by mouth daily.    Dispense:  90 tablet    Refill:  3  . DISCONTD: spironolactone (ALDACTONE) 25 MG tablet    Sig: Take 1 tablet (25 mg total) by mouth daily.    Dispense:  90 tablet    Refill:  3  . spironolactone (ALDACTONE) 25 MG tablet    Sig: Take 1 tablet (25 mg total) by mouth daily.    Dispense:  30 tablet    Refill:  2    Please do not Rx 90 day supply. Trying this out    Medications Discontinued During This Encounter  Medication Reason  . amLODipine (NORVASC) 5 MG tablet Side effect (s)  . valsartan-hydrochlorothiazide (DIOVAN-HCT) 160-12.5 MG tablet Side effect (s)  . spironolactone (ALDACTONE) 25 MG tablet     Recommendations:   Dalton Cardenas  is a 86 y.o. 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 Cristopher Peru and recommended either ablation or continued medical  therapy. Patient decided to do medical therapy for now. He presents for 6 month OV.    Presently tolerating Eliquis without  bleeding diathesis.  Could not tolerate beta-blockers in the past due to marked bradycardia but is on low-dose due to presence of atrial flutter with RVR, presently tolerating metoprolol succinate 25 mg twice daily.  Has asymptomatic underlying brief Mobitz 2 AV block with high-dose beta-blocker therapy.  Since discontinuing amlodipine, his leg edema has resolved.  Blood pressure is uncontrolled, I will discontinue valsartan HCT due to photophobia and switch him to plain valsartan but will add Aldactone 25 mg in the morning and follow-up on a BMP in 1 week to 10 days.  I would like to see him back in 6 weeks to follow-up on hypertension specifically.  Continue anticoagulation for history of atrial flutter and occasional episodes of palpitations.  He has not had any bleeding diathesis.  He has new carotid bruit, will obtain carotid duplex.  I will repeat his echocardiogram as systolic murmur in the aortic area appears to be much more problems than previous.   Adrian Prows, MD, [facility] 05/08/2020, 12:22 PM Office: [phone]

## 2020-05-04 ENCOUNTER — Other Ambulatory Visit: Payer: Medicare Other

## 2020-05-18 ENCOUNTER — Other Ambulatory Visit: Payer: Medicare Other

## 2020-05-20 ENCOUNTER — Other Ambulatory Visit: Payer: Self-pay | Admitting: Cardiology

## 2020-05-20 DIAGNOSIS — I10 Essential (primary) hypertension: Secondary | ICD-10-CM

## 2020-05-23 ENCOUNTER — Other Ambulatory Visit: Payer: Self-pay | Admitting: Cardiology

## 2020-05-23 DIAGNOSIS — I10 Essential (primary) hypertension: Secondary | ICD-10-CM

## 2020-06-03 ENCOUNTER — Other Ambulatory Visit: Payer: Self-pay

## 2020-06-03 ENCOUNTER — Ambulatory Visit: Payer: Medicare Other

## 2020-06-03 DIAGNOSIS — I351 Nonrheumatic aortic (valve) insufficiency: Secondary | ICD-10-CM

## 2020-06-03 DIAGNOSIS — I10 Essential (primary) hypertension: Secondary | ICD-10-CM

## 2020-06-03 DIAGNOSIS — R0989 Other specified symptoms and signs involving the circulatory and respiratory systems: Secondary | ICD-10-CM

## 2020-06-11 LAB — BASIC METABOLIC PANEL
BUN/Creatinine Ratio: 17 (ref 10–24)
BUN: 18 mg/dL (ref 8–27)
CO2: 23 mmol/L (ref 20–29)
Calcium: 9.5 mg/dL (ref 8.6–10.2)
Chloride: 99 mmol/L (ref 96–106)
Creatinine, Ser: 1.07 mg/dL (ref 0.76–1.27)
GFR calc Af Amer: 73 mL/min/{1.73_m2} (ref >59)
GFR calc non Af Amer: 63 mL/min/{1.73_m2} (ref >59)
Glucose: 87 mg/dL (ref 65–99)
Potassium: 5.1 mmol/L (ref 3.5–5.2)
Sodium: 133 mmol/L — ABNORMAL LOW (ref 134–144)

## 2020-06-13 ENCOUNTER — Other Ambulatory Visit: Payer: Self-pay

## 2020-06-13 ENCOUNTER — Encounter: Payer: Self-pay | Admitting: Cardiology

## 2020-06-13 ENCOUNTER — Ambulatory Visit: Payer: Medicare Other | Admitting: Cardiology

## 2020-06-13 VITALS — BP 171/75 | HR 54 | Resp 16 | Ht 67.0 in | Wt 163.0 lb

## 2020-06-13 DIAGNOSIS — I6523 Occlusion and stenosis of bilateral carotid arteries: Secondary | ICD-10-CM

## 2020-06-13 DIAGNOSIS — R001 Bradycardia, unspecified: Secondary | ICD-10-CM

## 2020-06-13 DIAGNOSIS — I10 Essential (primary) hypertension: Secondary | ICD-10-CM

## 2020-06-13 MED ORDER — HYDRALAZINE HCL 25 MG PO TABS: 25.0000 mg | ORAL_TABLET | Freq: Three times a day (TID) | ORAL | 2 refills | Status: DC

## 2020-06-13 MED ORDER — METOPROLOL SUCCINATE ER 25 MG PO TB24: 25.0000 mg | ORAL_TABLET | Freq: Every day | ORAL | 0 refills | Status: DC

## 2020-06-13 NOTE — Progress Notes (Signed)
Primary Physician/Referring:  Leighton Ruff, MD  Patient ID: Dalton Cardenas, male    DOB: 20-Apr-1934, 86 y.o.   MRN: [id number]  Chief Complaint  Patient presents with  . Essential hypertension, benign  . Atrial Fibrillation  . Follow-up    6 weeks    HPI:    Dalton Cardenas  is a 86 y.o.  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 Cristopher Peru and recommended either ablation or continued medical therapy. Patient decided to do medical therapy for now. He presents for 6 month OV.    Presently tolerating Eliquis without bleeding diathesis.  Could not tolerate beta-blockers in the past due to marked bradycardia but is on low-dose due to presence of atrial flutter with RVR, presently tolerating metoprolol succinate 25 mg twice daily.  Has asymptomatic underlying brief Mobitz 2 AV block with high-dose beta-blocker therapy.  He denies any dizziness or syncope.  On his last office visit I discontinued Diovan HCT and switched him to plain Diovan as he was complaining of photophobia since then this is also resolved.  States that his blood pressure has been well controlled at home.  He has occasional palpitations but otherwise remains asymptomatic.   Past Medical History:  Diagnosis Date  . Coronary artery disease    60-70% circumflex (cath in [place] 08/2010).  . Hypercholesterolemia   . Hypertension   . Prostate cancer [facility])    status post prostatectomy  . Sleep apnea   . SVT (supraventricular tachycardia) ([place])    a. Holter monitoring 2011 - AV node reentry, multifocal atrial tachycardia, and resting bradycardia. b. previously tx with Abana in [place].   Past Surgical History:  Procedure Laterality Date  . ABLATION OF DYSRHYTHMIC FOCUS  10/13/2014   SVT         by Dr Lovena Le  . ANAL FISSURE REPAIR    . ELECTROPHYSIOLOGY STUDY N/A 10/13/2014   Procedure:  ELECTROPHYSIOLOGY STUDY;  Surgeon: Evans Lance, MD;  Location: [facility] CATH LAB;  Service: Cardiovascular;  Laterality: N/A;  . PROSTATECTOMY    . SUPRAVENTRICULAR TACHYCARDIA ABLATION N/A 10/13/2014   Procedure: SUPRAVENTRICULAR TACHYCARDIA ABLATION;  Surgeon: Evans Lance, MD;  Location[facility] CATH LAB;  Service: Cardiovascular;  Laterality: N/A;   Social History   Tobacco Use  . Smoking status: Never Smoker  . Smokeless tobacco: Never Used  Substance Use Topics  . Alcohol use: No   ROS  Review of Systems  Cardiovascular: Positive for palpitations. Negative for dyspnea on exertion and leg swelling.  Gastrointestinal: Negative for melena.  Neurological: Positive for vertigo (Occasional). Negative for light-headedness.   Objective  Blood pressure (!) 171/75, pulse (!) 54, resp. rate 16, height 5\' 7"  (1.702 m), weight 163 lb (73.9 kg), SpO2 96 %.  Vitals with BMI 06/13/2020 05/02/2020 11/26/2019  Height 5\' 7"  5\' 7"  5\' 7"   Weight 163 lbs 161 lbs 162 lbs 6 oz  BMI 25.52 03.70 48.88  Systolic 916 945 038  Diastolic 75 80 74  Pulse 54 69 60     Physical Exam Neck:     Thyroid: No thyromegaly.  Cardiovascular:     Rate and Rhythm: Normal rate and regular rhythm.     Pulses: Intact distal pulses.     Heart sounds: Normal heart sounds. No murmur heard.  No gallop.      Comments: No leg edema, no  JVD. Pulmonary:     Effort: Pulmonary effort is normal.     Breath sounds: Normal breath sounds.  Abdominal:     General: Bowel sounds are normal.     Palpations: Abdomen is soft.  Musculoskeletal:     Cervical back: Neck supple.  Skin:    General: Skin is warm and dry.    Laboratory examination:   Recent Labs    06/10/20 1637  NA 133*  K 5.1  CL 99  CO2 23  GLUCOSE 87  BUN 18  CREATININE 1.07  CALCIUM 9.5  GFRNONAA 63  GFRAA 73   estimated creatinine clearance is 48 mL/min (by C-G formula based on SCr of 1.07 mg/dL).  CMP Latest Ref Rng & Units 06/10/2020 10/07/2014  07/22/2012  Glucose 65 - 99 mg/dL 87 109(H) 120(H)  BUN 8 - 27 mg/dL 18 18 11   Creatinine 0.76 - 1.27 mg/dL 1.07 0.95 0.93  Sodium 134 - 144 mmol/L 133(L) 139 136  Potassium 3.5 - 5.2 mmol/L 5.1 4.2 3.4(L)  Chloride 96 - 106 mmol/L 99 105 98  CO2 20 - 29 mmol/L 23 27 28   Calcium 8.6 - 10.2 mg/dL 9.5 9.3 9.4   CBC Latest Ref Rng & Units 10/07/2014 07/22/2012  WBC 4.0 - 10.5 K/uL 6.8 10.3  Hemoglobin 13.0 - 17.0 g/dL 13.3 14.3  Hematocrit 39 - 52 % 38.5(L) 40.7  Platelets 150 - 400 K/uL 224.0 213   Lipid Panel  No results found for: CHOL, TRIG, HDL, CHOLHDL, VLDL, LDLCALC, LDLDIRECT HEMOGLOBIN A1C No results found for: HGBA1C, MPG TSH No results for input(s): TSH in the last 8760 hours.  External labs 07/06/2019:   Cholesterol, total 189.000 01/25/2020 HDL 46.000 01/25/2020 LDL-C 123.000 01/25/2020 Triglycerides 108.000 01/25/2020  A1C 6.300 01/25/2020 TSH 4.640 01/25/2020   Hemoglobin 12.800 01/25/2020  Creatinine, Serum 1.150 01/25/2020 Potassium 4.100 01/25/2020 Magnesium N/D ALT (SGPT) 20.000 01/25/2020   Cholesterol, total 281.000 07/06/2019 HDL 42.000 07/06/2019 LDL 206.000 07/06/2019 Triglycerides 168.000 07/06/2019 A1C 5.800 07/06/2019 Hemoglobin 12.800 07/06/2019 Creatinine, Serum 1.140 07/06/2019 Potassium 4.200 07/06/2019 Magnesium N/D ALT (SGPT) 12.000 07/06/2019  Medications and allergies   Allergies  Allergen Reactions  . Amoxicillin     GI upset: Bleeding diarrhea  Other reaction(s): GI Bleed  . Ibuprofen     Bloody diarrhea   . Penicillins     Bloody diarrhea   . Ace Inhibitors     Face swelling   . Amlodipine Swelling  . Atorvastatin     Weakness   . Azithromycin     Stomach ache   . Diltiazem Nausea Only    Upset stomach per patient  . Hydrochlorothiazide Other (See Comments)    Photosensitivity    Current Outpatient Medications on File Prior to Visit  Medication Sig Dispense Refill  . b complex vitamins tablet Take 1 tablet by  mouth daily.    . cholecalciferol (VITAMIN D) 1000 UNITS tablet Take 1,000 Units by mouth daily.    . Coenzyme Q10 (CO Q-10 PO) Take 1 capsule by mouth daily. (200mg )    . cyanocobalamin 1000 MCG tablet Take 1,000 mcg by mouth daily.    Marland Kitchen ELIQUIS 5 MG TABS tablet Take 1 tablet by mouth twice daily 409 tablet 1  . folic acid (FOLVITE) 811 MCG tablet Take 800 mcg by mouth daily.      Marland Kitchen L-Arginine 1000 MG TABS Take 1 tablet by mouth daily.     . LUTEIN PO Take 1 tablet by mouth daily.    Marland Kitchen  Magnesium 400 MG TABS Take 400 mg by mouth daily.     . Probiotic Product (ALIGN PO) Take 1 capsule by mouth daily.     . rosuvastatin (CRESTOR) 10 MG tablet Take 10 mg by mouth daily.      Marland Kitchen spironolactone (ALDACTONE) 25 MG tablet Take 1 tablet (25 mg total) by mouth daily. 30 tablet 2  . valsartan (DIOVAN) 160 MG tablet Take 1 tablet (160 mg total) by mouth daily. 90 tablet 3  . vitamin C (ASCORBIC ACID) 500 MG tablet Take 500 mg by mouth daily.    Marland Kitchen zinc gluconate 50 MG tablet Take 50 mg by mouth daily.      No current facility-administered medications on file prior to visit.    Radiology:  No results found.  Cardiac Studies:   Coronary Angiogram  [2011]: Performed in [place], 70% stenosis in the circumflex coronary artery. Was performed as a part of workup for EP study for the SVT.  Nuclear stress test  [04/26/2010]: Myocardial perfusion scan 04/26/2010: Normal perfusion, EF 77%.  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  Echocardiogram 06/03/2020: Left ventricle cavity is normal in size. Moderate asymmetric hypertrophy of the left ventricle. Basal septum measures at 2.0 cm. Mild LVOT peak gradient 5 mmHg without SAM or LVOT obstruction. Normal wall motion. LVEF 55-60%. Indeterminate diastolic dysfunction. Left atrial cavity is mildly dilated. Trileaflet aortic  valve. Trace aortic stenosis. Moderate (Grade II) aortic regurgitation. Mild calcification, No significant stenosis. Moderate (Grade II) mitral regurgitation. Mild tricuspid regurgitation. Estimated pulmonary artery systolic pressure 30 mmHg.  No significant change compared to previous study in 2018.  Carotid artery duplex  06/03/2020: Stenosis in the right internal carotid artery (16-49%). Stenosis in the left internal carotid artery (>=70%). The left PSV internal/common carotid artery ratio is consistent with a stenosis of >70%. The duplex suggest near subtotal occlusion with soft plaque at the level of the proximal left ICA.  Recommend different modality imaging to confirm severity of the disease.  Follow up study in six months is appropriate if clinically indicated.  EKG:    EKG 05/02/2020: Sinus rhythm with first-degree AV block at rate of 71 bpm, left atrial enlargement, left axis deviation, left anterior fascicular block.  Poor R wave progression, cannot exclude anteroseptal infarct old.  No evidence of ischemia.  No significant change from 10/22/2019.  Assessment     ICD-10-CM   1. Essential hypertension, benign  I10 metoprolol succinate (TOPROL-XL) 25 MG 24 hr tablet    hydrALAZINE (APRESOLINE) 25 MG tablet  2. Bradycardia by electrocardiogram  R00.1   3. Asymptomatic bilateral carotid artery stenosis  I65.23 CT ANGIO NECK W OR WO CONTRAST    CHA2DS2-VASc Score is 4.  Yearly risk of stroke: 4.8% (A, HTN, Vasc Dz).  Score of 1=0.6; 2=2.2; 3=3.2; 4=4.8; 5=7.2; 6=9.8; 7=>9.8) -(CHF; HTN; vasc disease DM,  Male = 1; Age <65 =0; 65-74 = 1,  >75 =2; stroke/embolism= 2).    Meds ordered this encounter  Medications  . metoprolol succinate (TOPROL-XL) 25 MG 24 hr tablet    Sig: Take 1 tablet (25 mg total) by mouth daily. as directed    Dispense:  90 tablet    Refill:  0  . hydrALAZINE (APRESOLINE) 25 MG tablet    Sig: Take 1 tablet (25 mg total) by mouth 3 (three) times daily. Take  one  if BP >140/70, 2 tab if SBP>160 mm Hg    Dispense:  90 tablet    Refill:  2   Medications Discontinued During This Encounter  Medication Reason  . metoprolol succinate (TOPROL-XL) 25 MG 24 hr tablet   . hydrALAZINE (APRESOLINE) 25 MG tablet Reorder    Recommendations:   Dalton Cardenas  is a 86 y.o. 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 Cristopher Peru and recommended either ablation or continued medical therapy. Patient decided to do medical therapy for now. He presents for 6 month OV.    Presently tolerating Eliquis without bleeding diathesis.  Could not tolerate beta-blockers in the past due to marked bradycardia but is on low-dose due to presence of atrial flutter with RVR, presently tolerating metoprolol succinate 25 mg twice daily.  Has asymptomatic underlying brief Mobitz 2 AV block with high-dose beta-blocker therapy.  He has not had any syncope or dizziness, however in view of bradycardia, with weight is contributing to elevated systolic blood pressure needs to be kept in mind and hence I would like to reduce metoprolol succinate from 50 mg daily to 25 mg daily.  Advised him to restart hydralazine at 1 tablet 3 times a day instead of 3 times daily as needed.  Patient states that his blood pressure is well controlled at home.  He will bring his blood pressure apparatus on his next office visit.  I reviewed his echocardiogram, no significant change in aortic regurgitation.  Carotid artery duplex surprisingly reveals high-grade left carotid stenosis probably underestimated by duplex, I would like to obtain CT angiogram of the neck to evaluate severity stenosis as I suspect his left carotid artery stenosis is probably underestimated by duplex.  I would like to see him back in 6 weeks for follow-up.    Adrian Prows, MD, [facility] 06/13/2020, 3:09 PM Office:  [phone]

## 2020-06-20 ENCOUNTER — Other Ambulatory Visit: Payer: Self-pay | Admitting: Cardiology

## 2020-06-20 ENCOUNTER — Telehealth: Payer: Self-pay

## 2020-06-20 NOTE — Telephone Encounter (Signed)
Received voicemail from patient regarding some tingling/ "strange sensations" in legs and torso. Patient is unable to explain the sensations, but stated that it started this weekend 10/1 and hasn't subsided. Patient is also reporting some tightness in his chest as well. Patient is requesting a call back from you. Please advise. Thanks!

## 2020-06-20 NOTE — Telephone Encounter (Signed)
I called the patient and discussed over the telephone, patient has been extremely anxious since the diagnosis of carotid artery stenosis.  States that he has had some tingling and numbness in his feet on both feet.  This is semiepisodic.  No other symptoms to suggest neurologic deficit.  I reassured him.  His blood pressure is also improved on spironolactone.  He has an appointment to get carotid CTA next week.   Adrian Prows, MD, [facility] 06/20/2020, 5:12 PM Office: [phone]

## 2020-06-21 ENCOUNTER — Telehealth: Payer: Self-pay | Admitting: Cardiology

## 2020-06-21 DIAGNOSIS — H9319 Tinnitus, unspecified ear: Secondary | ICD-10-CM

## 2020-06-21 DIAGNOSIS — F418 Other specified anxiety disorders: Secondary | ICD-10-CM

## 2020-06-21 MED ORDER — DULOXETINE HCL 30 MG PO CPEP: 30.0000 mg | ORAL_CAPSULE | Freq: Every day | ORAL | 3 refills | Status: DC

## 2020-06-21 MED ORDER — MECLIZINE HCL 12.5 MG PO TABS: 12.5000 mg | ORAL_TABLET | Freq: Three times a day (TID) | ORAL | 0 refills | Status: DC | PRN

## 2020-06-21 NOTE — Telephone Encounter (Signed)
Patient called about tinnitus, this is a chronic problem but had acute onset today.  He has been very anxious since the diagnosis of carotid artery stenosis.  I reassured him, advised him to follow-up with ENT in the morning but could try meclizine 12.5 mg p.o. 3 times daily as needed.  In view of extreme anxiety, I will try duloxetine 30 mg daily.    ICD-10-CM   1. Situational anxiety  F41.8 DULoxetine (CYMBALTA) 30 MG capsule  2. Tinnitus, unspecified laterality  H93.19 meclizine (ANTIVERT) 12.5 MG tablet     Adrian Prows, MD, [facility] 06/21/2020, 9:08 PM Office: [phone]

## 2020-06-28 ENCOUNTER — Other Ambulatory Visit: Payer: Self-pay

## 2020-06-28 ENCOUNTER — Ambulatory Visit
Admission: RE | Admit: 2020-06-28 | Discharge: 2020-06-28 | Disposition: A | Discharge status: Home / Self Care | Payer: Medicare Other | Source: Ambulatory Visit | Attending: Cardiology | Admitting: Cardiology

## 2020-06-28 DIAGNOSIS — I6523 Occlusion and stenosis of bilateral carotid arteries: Secondary | ICD-10-CM

## 2020-06-28 MED ORDER — IOPAMIDOL (ISOVUE-370) INJECTION 76%
75.0000 mL | Freq: Once | INTRAVENOUS | Status: AC | PRN
  Administered 2020-06-28: 75 mL via INTRAVENOUS

## 2020-06-29 ENCOUNTER — Other Ambulatory Visit: Payer: Self-pay | Admitting: Cardiology

## 2020-06-29 DIAGNOSIS — I6523 Occlusion and stenosis of bilateral carotid arteries: Secondary | ICD-10-CM

## 2020-06-29 NOTE — Progress Notes (Signed)
He is like my family, please evaluate at your earliest possible.  Thank you. JG

## 2020-06-29 NOTE — Progress Notes (Signed)
CT angiography of the neck 06/28/2020: 1.  Noncalcified atherosclerotic disease in the bifurcation of the right ICA <50% stenosis. 2. 80% stenosis of the proximal left internal carotid artery secondary to noncalcified plaque. 3. 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 4. Aortic Atherosclerosis (ICD10-I70.0).  Will refer to vascular surgery for evaluation.

## 2020-06-29 NOTE — Progress Notes (Addendum)
Patient ID: Dalton Cardenas, male   DOB: 1934-01-21, 86 y.o.   MRN: [id number] I discussed the findings of the carotid artery stenosis with the patient and his wife at length.  He has asymptomatic right subclavian stenosis, I would probably leave this alone.  I will make a referral for Dr. Ruta Hinds evaluate the patient and give his expert opinion.

## 2020-06-30 ENCOUNTER — Other Ambulatory Visit: Payer: Self-pay

## 2020-06-30 ENCOUNTER — Inpatient Hospital Stay (HOSPITAL_COMMUNITY)
Admission: EM | Admit: 2020-06-30 | Discharge: 2020-07-02 | DRG: 640 | Disposition: A | Discharge status: Home / Self Care | Payer: Medicare Other | Attending: Internal Medicine | Admitting: Internal Medicine

## 2020-06-30 ENCOUNTER — Encounter (HOSPITAL_COMMUNITY): Payer: Self-pay | Admitting: *Deleted

## 2020-06-30 ENCOUNTER — Emergency Department (HOSPITAL_COMMUNITY): Payer: Medicare Other

## 2020-06-30 DIAGNOSIS — Z8546 Personal history of malignant neoplasm of prostate: Secondary | ICD-10-CM | POA: Diagnosis not present

## 2020-06-30 DIAGNOSIS — I484 Atypical atrial flutter: Secondary | ICD-10-CM | POA: Diagnosis present

## 2020-06-30 DIAGNOSIS — I2510 Atherosclerotic heart disease of native coronary artery without angina pectoris: Secondary | ICD-10-CM | POA: Diagnosis present

## 2020-06-30 DIAGNOSIS — I771 Stricture of artery: Secondary | ICD-10-CM

## 2020-06-30 DIAGNOSIS — Z7901 Long term (current) use of anticoagulants: Secondary | ICD-10-CM

## 2020-06-30 DIAGNOSIS — Z9079 Acquired absence of other genital organ(s): Secondary | ICD-10-CM | POA: Diagnosis not present

## 2020-06-30 DIAGNOSIS — I6523 Occlusion and stenosis of bilateral carotid arteries: Secondary | ICD-10-CM | POA: Diagnosis present

## 2020-06-30 DIAGNOSIS — I708 Atherosclerosis of other arteries: Secondary | ICD-10-CM | POA: Diagnosis present

## 2020-06-30 DIAGNOSIS — I483 Typical atrial flutter: Secondary | ICD-10-CM | POA: Diagnosis present

## 2020-06-30 DIAGNOSIS — E860 Dehydration: Secondary | ICD-10-CM | POA: Diagnosis present

## 2020-06-30 DIAGNOSIS — Z880 Allergy status to penicillin: Secondary | ICD-10-CM | POA: Diagnosis not present

## 2020-06-30 DIAGNOSIS — T500X5A Adverse effect of mineralocorticoids and their antagonists, initial encounter: Secondary | ICD-10-CM | POA: Diagnosis present

## 2020-06-30 DIAGNOSIS — E861 Hypovolemia: Secondary | ICD-10-CM | POA: Diagnosis present

## 2020-06-30 DIAGNOSIS — Z888 Allergy status to other drugs, medicaments and biological substances status: Secondary | ICD-10-CM | POA: Diagnosis not present

## 2020-06-30 DIAGNOSIS — Z886 Allergy status to analgesic agent status: Secondary | ICD-10-CM

## 2020-06-30 DIAGNOSIS — G4733 Obstructive sleep apnea (adult) (pediatric): Secondary | ICD-10-CM | POA: Diagnosis present

## 2020-06-30 DIAGNOSIS — I130 Hypertensive heart and chronic kidney disease with heart failure and stage 1 through stage 4 chronic kidney disease, or unspecified chronic kidney disease: Secondary | ICD-10-CM | POA: Diagnosis present

## 2020-06-30 DIAGNOSIS — F32A Depression, unspecified: Secondary | ICD-10-CM | POA: Diagnosis present

## 2020-06-30 DIAGNOSIS — K219 Gastro-esophageal reflux disease without esophagitis: Secondary | ICD-10-CM | POA: Diagnosis present

## 2020-06-30 DIAGNOSIS — Z8249 Family history of ischemic heart disease and other diseases of the circulatory system: Secondary | ICD-10-CM

## 2020-06-30 DIAGNOSIS — Z20822 Contact with and (suspected) exposure to covid-19: Secondary | ICD-10-CM | POA: Diagnosis present

## 2020-06-30 DIAGNOSIS — E871 Hypo-osmolality and hyponatremia: Secondary | ICD-10-CM | POA: Diagnosis present

## 2020-06-30 DIAGNOSIS — I4892 Unspecified atrial flutter: Secondary | ICD-10-CM

## 2020-06-30 DIAGNOSIS — N179 Acute kidney failure, unspecified: Secondary | ICD-10-CM | POA: Diagnosis present

## 2020-06-30 DIAGNOSIS — I5031 Acute diastolic (congestive) heart failure: Secondary | ICD-10-CM | POA: Diagnosis present

## 2020-06-30 DIAGNOSIS — F419 Anxiety disorder, unspecified: Secondary | ICD-10-CM | POA: Diagnosis present

## 2020-06-30 DIAGNOSIS — R0602 Shortness of breath: Secondary | ICD-10-CM | POA: Diagnosis present

## 2020-06-30 DIAGNOSIS — I10 Essential (primary) hypertension: Secondary | ICD-10-CM | POA: Diagnosis present

## 2020-06-30 DIAGNOSIS — Z79899 Other long term (current) drug therapy: Secondary | ICD-10-CM

## 2020-06-30 DIAGNOSIS — E7800 Pure hypercholesterolemia, unspecified: Secondary | ICD-10-CM | POA: Diagnosis present

## 2020-06-30 DIAGNOSIS — E785 Hyperlipidemia, unspecified: Secondary | ICD-10-CM | POA: Diagnosis present

## 2020-06-30 HISTORY — DX: Stricture of artery: I77.1

## 2020-06-30 LAB — BASIC METABOLIC PANEL
Anion gap: 12 (ref 5–15)
Anion gap: 9 (ref 5–15)
Anion gap: 8 (ref 5–15)
Anion gap: 7 (ref 5–15)
BUN: 27 mg/dL — ABNORMAL HIGH (ref 8–23)
BUN: 21 mg/dL (ref 8–23)
BUN: 19 mg/dL (ref 8–23)
BUN: 21 mg/dL (ref 8–23)
CO2: 17 mmol/L — ABNORMAL LOW (ref 22–32)
CO2: 20 mmol/L — ABNORMAL LOW (ref 22–32)
CO2: 21 mmol/L — ABNORMAL LOW (ref 22–32)
CO2: 21 mmol/L — ABNORMAL LOW (ref 22–32)
Calcium: 8.6 mg/dL — ABNORMAL LOW (ref 8.9–10.3)
Calcium: 8.5 mg/dL — ABNORMAL LOW (ref 8.9–10.3)
Calcium: 8.5 mg/dL — ABNORMAL LOW (ref 8.9–10.3)
Calcium: 8.5 mg/dL — ABNORMAL LOW (ref 8.9–10.3)
Chloride: 90 mmol/L — ABNORMAL LOW (ref 98–111)
Chloride: 96 mmol/L — ABNORMAL LOW (ref 98–111)
Chloride: 97 mmol/L — ABNORMAL LOW (ref 98–111)
Chloride: 99 mmol/L (ref 98–111)
Creatinine, Ser: 1.43 mg/dL — ABNORMAL HIGH (ref 0.61–1.24)
Creatinine, Ser: 1.15 mg/dL (ref 0.61–1.24)
Creatinine, Ser: 1.11 mg/dL (ref 0.61–1.24)
Creatinine, Ser: 1.17 mg/dL (ref 0.61–1.24)
GFR, Estimated: 45 mL/min — ABNORMAL LOW (ref >60)
GFR, Estimated: 58 mL/min — ABNORMAL LOW (ref >60)
GFR, Estimated: >60 mL/min (ref >60)
GFR, Estimated: 57 mL/min — ABNORMAL LOW (ref >60)
Glucose, Bld: 154 mg/dL — ABNORMAL HIGH (ref 70–99)
Glucose, Bld: 120 mg/dL — ABNORMAL HIGH (ref 70–99)
Glucose, Bld: 118 mg/dL — ABNORMAL HIGH (ref 70–99)
Glucose, Bld: 131 mg/dL — ABNORMAL HIGH (ref 70–99)
Potassium: 4.1 mmol/L (ref 3.5–5.1)
Potassium: 4.5 mmol/L (ref 3.5–5.1)
Potassium: 4.5 mmol/L (ref 3.5–5.1)
Potassium: 4.5 mmol/L (ref 3.5–5.1)
Sodium: 119 mmol/L — CL (ref 135–145)
Sodium: 125 mmol/L — ABNORMAL LOW (ref 135–145)
Sodium: 126 mmol/L — ABNORMAL LOW (ref 135–145)
Sodium: 127 mmol/L — ABNORMAL LOW (ref 135–145)

## 2020-06-30 LAB — BRAIN NATRIURETIC PEPTIDE: B Natriuretic Peptide: 622 pg/mL — ABNORMAL HIGH (ref 0.0–100.0)

## 2020-06-30 LAB — TSH: TSH: 1.774 u[IU]/mL (ref 0.350–4.500)

## 2020-06-30 LAB — CBC
HCT: 37.5 % — ABNORMAL LOW (ref 39.0–52.0)
Hemoglobin: 12.9 g/dL — ABNORMAL LOW (ref 13.0–17.0)
MCH: 29.7 pg (ref 26.0–34.0)
MCHC: 34.4 g/dL (ref 30.0–36.0)
MCV: 86.2 fL (ref 80.0–100.0)
Platelets: 321 10*3/uL (ref 150–400)
RBC: 4.35 MIL/uL (ref 4.22–5.81)
RDW: 12.2 % (ref 11.5–15.5)
WBC: 9.7 10*3/uL (ref 4.0–10.5)
nRBC: 0 % (ref 0.0–0.2)

## 2020-06-30 LAB — TROPONIN I (HIGH SENSITIVITY)
Troponin I (High Sensitivity): 39 ng/L — ABNORMAL HIGH (ref <18)
Troponin I (High Sensitivity): 27 ng/L — ABNORMAL HIGH (ref <18)
Troponin I (High Sensitivity): 29 ng/L — ABNORMAL HIGH (ref <18)
Troponin I (High Sensitivity): 31 ng/L — ABNORMAL HIGH (ref <18)

## 2020-06-30 LAB — MAGNESIUM: Magnesium: 2.1 mg/dL (ref 1.7–2.4)

## 2020-06-30 LAB — RESPIRATORY PANEL BY RT PCR (FLU A&B, COVID)
Influenza A by PCR: NEGATIVE
Influenza B by PCR: NEGATIVE
SARS Coronavirus 2 by RT PCR: NEGATIVE

## 2020-06-30 LAB — OSMOLALITY: Osmolality: 267 mOsm/kg — ABNORMAL LOW (ref 275–295)

## 2020-06-30 LAB — PHOSPHORUS: Phosphorus: 3 mg/dL (ref 2.5–4.6)

## 2020-06-30 LAB — LIPASE, BLOOD: Lipase: 39 U/L (ref 11–51)

## 2020-06-30 MED ORDER — AMLODIPINE BESYLATE 5 MG PO TABS
5.0000 mg | ORAL_TABLET | Freq: Every evening | ORAL | Status: DC
  Filled 2020-06-30: qty 1

## 2020-06-30 MED ORDER — HYDRALAZINE HCL 20 MG/ML IJ SOLN: 10.0000 mg | Freq: Four times a day (QID) | INTRAMUSCULAR | Status: DC | PRN

## 2020-06-30 MED ORDER — ACETAMINOPHEN 650 MG RE SUPP: 650.0000 mg | Freq: Four times a day (QID) | RECTAL | Status: DC | PRN

## 2020-06-30 MED ORDER — ONDANSETRON HCL 4 MG PO TABS: 4.0000 mg | ORAL_TABLET | Freq: Four times a day (QID) | ORAL | Status: DC | PRN

## 2020-06-30 MED ORDER — FOLIC ACID 1 MG PO TABS
1.0000 mg | ORAL_TABLET | Freq: Every day | ORAL | Status: DC
  Administered 2020-06-30 – 2020-07-02 (×3): 1 mg via ORAL
  Filled 2020-06-30 (×3): qty 1

## 2020-06-30 MED ORDER — HYDRALAZINE HCL 25 MG PO TABS
25.0000 mg | ORAL_TABLET | Freq: Three times a day (TID) | ORAL | Status: DC
  Administered 2020-06-30 (×2): 25 mg via ORAL
  Filled 2020-06-30 (×2): qty 1

## 2020-06-30 MED ORDER — SODIUM CHLORIDE 0.9 % IV SOLN: INTRAVENOUS | Status: DC

## 2020-06-30 MED ORDER — ROSUVASTATIN CALCIUM 5 MG PO TABS
10.0000 mg | ORAL_TABLET | Freq: Every day | ORAL | Status: DC
  Administered 2020-06-30 – 2020-07-02 (×3): 10 mg via ORAL
  Filled 2020-06-30 (×3): qty 2

## 2020-06-30 MED ORDER — SODIUM CHLORIDE 0.9 % IV BOLUS
1000.0000 mL | Freq: Once | INTRAVENOUS | Status: AC
  Administered 2020-06-30: 1000 mL via INTRAVENOUS

## 2020-06-30 MED ORDER — ONDANSETRON HCL 4 MG/2ML IJ SOLN: 4.0000 mg | Freq: Four times a day (QID) | INTRAMUSCULAR | Status: DC | PRN

## 2020-06-30 MED ORDER — DULOXETINE HCL 30 MG PO CPEP
30.0000 mg | ORAL_CAPSULE | Freq: Every day | ORAL | Status: DC
  Filled 2020-06-30: qty 1

## 2020-06-30 MED ORDER — ACETAMINOPHEN 325 MG PO TABS: 650.0000 mg | ORAL_TABLET | Freq: Four times a day (QID) | ORAL | Status: DC | PRN

## 2020-06-30 MED ORDER — APIXABAN 5 MG PO TABS
5.0000 mg | ORAL_TABLET | Freq: Two times a day (BID) | ORAL | Status: DC
  Administered 2020-06-30 – 2020-07-02 (×4): 5 mg via ORAL
  Filled 2020-06-30 (×4): qty 1

## 2020-06-30 MED ORDER — ONDANSETRON 4 MG PO TBDP
8.0000 mg | ORAL_TABLET | Freq: Once | ORAL | Status: AC
  Administered 2020-06-30: 8 mg via ORAL
  Filled 2020-06-30: qty 2

## 2020-06-30 MED ORDER — MECLIZINE HCL 25 MG PO TABS: 12.5000 mg | ORAL_TABLET | Freq: Three times a day (TID) | ORAL | Status: DC | PRN

## 2020-06-30 NOTE — ED Notes (Signed)
Pt is no longer nauseas

## 2020-06-30 NOTE — Progress Notes (Signed)
Notified MD Pahwani, R via secure chat that pt refused Norvasc and Cymbalta. Pt states that he does not take these medications at home.

## 2020-06-30 NOTE — Progress Notes (Addendum)
   06/30/20 1656  ECG Monitoring  CV Strip Heart Rate 36  Cardiac Rhythm Atrial flutter;Other (Comment) (HR to 36. RN Candace notified)   Dr. Doristine Bosworth notified of HR in the 30's. Pt was sleeping. I went in to assess pt woke him up he was alert and ok. Will continue to monitor.  Jacquelyn Antony, RN

## 2020-06-30 NOTE — ED Provider Notes (Signed)
[facility] EMERGENCY DEPARTMENT Provider Note   CSN: [id number] Arrival date & time: 06/30/20  3810     History Chief Complaint  Patient presents with   Abdominal Pain   Shortness of Breath    Dalton Cardenas is a 86 y.o. male with pertinent past medical history of coronary artery disease, hypercholesteremia, hypertension, sleep apnea, SVT that presents emergency department today for nausea, vomiting and weakness.  Patient states that he started having vomiting this morning, 2 episodes.  None bilious, nonhematemesis.  States that he started feeling nauseous this morning as well, states that he was in normal health yesterday.  Last meal last night.  Normal bowel movements, no diarrhea.  Triage note states that patient is having abdominal pain, patient denies any pain to me, states that he never had any abdominal pain.  Patient denies any chest pain.  Patient states that he did have some shortness of breath while vomiting, did not have shortness of breath after this.  Denies any shortness of breath currently.  Denies tobacco use or any lung history.  No history of heart failure.  Patient has been vaccinated against Covid in January, no sick contacts.  No cough, sore throat, congestion, myalgias, back pain, headache.  States that he feels fine now.  Sees Dr. Einar Gip, cardiology.  Is on Eliquis for a flutter. No dysuria or hematuria.    HPI     Past Medical History:  Diagnosis Date   Coronary artery disease    60-70% circumflex (cath in [place] 08/2010).   Hypercholesterolemia    Hypertension    Prostate cancer ([place])    status post prostatectomy   Sleep apnea    SVT (supraventricular tachycardia) ([place])    a. Holter monitoring 2011 - AV node reentry, multifocal atrial tachycardia, and resting bradycardia. b. previously tx with [facility] in [place].    Patient Active Problem List   Diagnosis Date Noted   Hyponatremia 06/30/2020   AKI (acute kidney injury) ([place])  06/30/2020   Atrial flutter ([place]) 06/30/2020   Chronic cough 11/26/2019   SVT (supraventricular tachycardia) ([place]) 10/13/2014   Syncope 07/22/2012   Coronary artery disease    CHEST PAIN UNSPECIFIED 04/13/2010   ADENOCARCINOMA, PROSTATE 12/12/2009   HYPERCHOLESTEROLEMIA 12/12/2009   Obstructive sleep apnea 12/12/2009   Essential hypertension, benign 12/12/2009   Supraventricular tachycardia 12/12/2009    Past Surgical History:  Procedure Laterality Date   ABLATION OF DYSRHYTHMIC FOCUS  10/13/2014   SVT         by Dr Lovena Le   ANAL FISSURE REPAIR     ELECTROPHYSIOLOGY STUDY N/A 10/13/2014   Procedure: ELECTROPHYSIOLOGY STUDY;  Surgeon: Evans Lance, MD;  Location: [facility] CATH LAB;  Service: Cardiovascular;  Laterality: N/A;   PROSTATECTOMY     SUPRAVENTRICULAR TACHYCARDIA ABLATION N/A 10/13/2014   Procedure: SUPRAVENTRICULAR TACHYCARDIA ABLATION;  Surgeon: Evans Lance, MD;  Location: [facility] CATH LAB;  Service: Cardiovascular;  Laterality: N/A;       Family History  Problem Relation Age of Onset   Hypertension Mother    Heart disease Father    Hypertension Sister    Hypertension Brother    Ulcers Sister    Hypertension Sister    Diabetes Neg Hx    Coronary artery disease Neg Hx     Social History   Tobacco Use   Smoking status: Never Smoker   Smokeless tobacco: Never Used  Vaping Use   Vaping Use: Never used  Substance Use Topics  Alcohol use: No   Drug use: No    Home Medications Prior to Admission medications   Medication Sig Start Date End Date Taking? Authorizing Provider  amLODipine (NORVASC) 5 MG tablet Take 5 mg by mouth every evening.   Yes [provider]  b complex vitamins tablet Take 1 tablet by mouth daily.   Yes [provider]  cholecalciferol (VITAMIN D) 1000 UNITS tablet Take 1,000 Units by mouth daily.   Yes [provider]  Coenzyme Q10 (CO Q-10 PO) Take 1 capsule by mouth daily. (200mg )    Yes [provider]  cyanocobalamin 1000 MCG tablet Take 1,000 mcg by mouth daily.   Yes [provider]  doxylamine, Sleep, (UNISOM) 25 MG tablet Take 25 mg by mouth at bedtime as needed for sleep.   Yes [provider]  DULoxetine (CYMBALTA) 30 MG capsule Take 1 capsule (30 mg total) by mouth daily. 06/21/20  Yes Adrian Prows, MD  ELIQUIS 5 MG TABS tablet Take 1 tablet by mouth twice daily Patient taking differently: Take 5 mg by mouth 2 (two) times daily.  06/20/20  Yes Adrian Prows, MD  folic acid (FOLVITE) 109 MCG tablet Take 800 mcg by mouth daily.     Yes [provider]  hydrALAZINE (APRESOLINE) 25 MG tablet Take 1 tablet (25 mg total) by mouth 3 (three) times daily. Take one if BP >140/70, 2 tab if SBP>160 mm Hg 06/13/20 09/11/20 Yes Adrian Prows, MD  L-Arginine 1000 MG TABS Take 1 tablet by mouth daily.    Yes [provider]  LUTEIN PO Take 1 tablet by mouth daily.   Yes [provider]  Magnesium 400 MG TABS Take 400 mg by mouth daily.    Yes [provider]  meclizine (ANTIVERT) 12.5 MG tablet Take 1 tablet (12.5 mg total) by mouth 3 (three) times daily as needed for dizziness (Tinnitus). 06/21/20  Yes Adrian Prows, MD  metoprolol succinate (TOPROL-XL) 25 MG 24 hr tablet Take 1 tablet (25 mg total) by mouth daily. as directed Patient taking differently: Take 25 mg by mouth in the morning and at bedtime. as directed 06/13/20  Yes Adrian Prows, MD  Probiotic Product (ALIGN PO) Take 1 capsule by mouth daily.    Yes [provider]  rosuvastatin (CRESTOR) 10 MG tablet Take 10 mg by mouth daily.     Yes [provider]  spironolactone (ALDACTONE) 25 MG tablet Take 1 tablet (25 mg total) by mouth daily. 05/02/20 07/31/20 Yes Adrian Prows, MD  valsartan-hydrochlorothiazide (DIOVAN-HCT) 160-12.5 MG tablet Take 1 tablet by mouth in the morning.   Yes [provider]  vitamin C (ASCORBIC ACID) 500 MG tablet Take 500 mg by  mouth daily.   Yes [provider]  zinc gluconate 50 MG tablet Take 50 mg by mouth daily.    Yes [provider]  valsartan (DIOVAN) 160 MG tablet Take 1 tablet (160 mg total) by mouth daily. Patient not taking: Reported on 06/30/2020 05/02/20   Adrian Prows, MD    Allergies    Amoxicillin, Ibuprofen, Penicillins, Ace inhibitors, Amlodipine, Atorvastatin, Azithromycin, Diltiazem, and Hydrochlorothiazide  Review of Systems   Review of Systems  Constitutional: Negative for chills, diaphoresis, fatigue and fever.  HENT: Negative for congestion, sore throat and trouble swallowing.   Eyes: Negative for pain and visual disturbance.  Respiratory: Negative for cough, shortness of breath and wheezing.   Cardiovascular: Negative for chest pain, palpitations and leg swelling.  Gastrointestinal: Positive  for nausea and vomiting. Negative for abdominal distention, abdominal pain and diarrhea.  Genitourinary: Negative for difficulty urinating.  Musculoskeletal: Negative for back pain, neck pain and neck stiffness.  Skin: Negative for pallor.  Neurological: Positive for weakness. Negative for dizziness, speech difficulty and headaches.  Psychiatric/Behavioral: Negative for confusion.    Physical Exam Updated Vital Signs BP (!) 157/77 (BP Location: Right Arm)    Pulse (!) 56    Temp 97.8 F (36.6 C) (Oral)    Resp 18    Ht 5\' 7"  (1.702 m)    Wt 73.9 kg    SpO2 100%    BMI 25.52 kg/m   Physical Exam Constitutional:      General: He is not in acute distress.    Appearance: Normal appearance. He is not ill-appearing, toxic-appearing or diaphoretic.     Comments: Pleasant male, does not appear in any acute distress.  HENT:     Mouth/Throat:     Mouth: Mucous membranes are moist.     Pharynx: Oropharynx is clear.  Eyes:     General: No scleral icterus.    Extraocular Movements: Extraocular movements intact.     Pupils: Pupils are equal, round, and reactive to light.    Cardiovascular:     Rate and Rhythm: Normal rate and regular rhythm.     Pulses: Normal pulses.     Heart sounds: Normal heart sounds.  Pulmonary:     Effort: Pulmonary effort is normal. No respiratory distress.     Breath sounds: Normal breath sounds. No stridor. No wheezing, rhonchi or rales.  Chest:     Chest wall: No tenderness.  Abdominal:     General: Abdomen is flat. There is no distension.     Palpations: Abdomen is soft.     Tenderness: There is no abdominal tenderness. There is no right CVA tenderness, left CVA tenderness, guarding or rebound.  Musculoskeletal:        General: No swelling or tenderness. Normal range of motion.     Cervical back: Normal range of motion and neck supple. No rigidity.     Right lower leg: No edema.     Left lower leg: No edema.  Skin:    General: Skin is warm and dry.     Capillary Refill: Capillary refill takes less than 2 seconds.     Coloration: Skin is not pale.  Neurological:     General: No focal deficit present.     Mental Status: He is alert and oriented to person, place, and time.     Cranial Nerves: No cranial nerve deficit.     Sensory: No sensory deficit.     Motor: No weakness.     Coordination: Coordination normal.  Psychiatric:        Mood and Affect: Mood normal.        Behavior: Behavior normal.     ED Results / Procedures / Treatments   Labs (all labs ordered are listed, but only abnormal results are displayed) Labs Reviewed  BASIC METABOLIC PANEL - Abnormal; Notable for the following components:      Result Value   Sodium 119 (*)    Chloride 90 (*)    CO2 17 (*)    Glucose, Bld 154 (*)    BUN 27 (*)    Creatinine, Ser 1.43 (*)    Calcium 8.6 (*)    GFR, Estimated 45 (*)    All other components within normal limits  CBC - Abnormal;  Notable for the following components:   Hemoglobin 12.9 (*)    HCT 37.5 (*)    All other components within normal limits  TROPONIN I (HIGH SENSITIVITY) - Abnormal; Notable for  the following components:   Troponin I (High Sensitivity) 39 (*)    All other components within normal limits  TROPONIN I (HIGH SENSITIVITY) - Abnormal; Notable for the following components:   Troponin I (High Sensitivity) 27 (*)    All other components within normal limits  RESPIRATORY PANEL BY RT PCR (FLU A&B, COVID)  BRAIN NATRIURETIC PEPTIDE    EKG EKG Interpretation  Date/Time:  Thursday June 30 2020 06:33:57 EDT Ventricular Rate:  57 PR Interval:  314 QRS Duration: 96 QT Interval:  452 QTC Calculation: 439 R Axis:   -68 Text Interpretation: Atrial flutter with 5:1 A-V conduction Incomplete right bundle branch block Left anterior fascicular block Moderate voltage criteria for LVH, may be normal variant ( R in aVL , Cornell product ) Cannot rule out Anteroseptal infarct , age undetermined Abnormal ECG No significant change since prior ecg Confirmed by Veryl Speak [phone]) on 06/30/2020 9:31:45 AM   Radiology DG Chest 2 View  Result Date: 06/30/2020 CLINICAL DATA:  Abdominal pain, vomiting, shortness of breath EXAM: CHEST - 2 VIEW COMPARISON:  11/26/2019 FINDINGS: Lungs are clear.  No pleural effusion or pneumothorax. The heart is normal in size.  Thoracic aortic atherosclerosis. Degenerative changes of the visualized thoracolumbar spine. IMPRESSION: Normal chest radiographs. Electronically Signed   By: Julian Hy M.D.   On: 06/30/2020 06:14   CT ANGIO NECK W OR WO CONTRAST  Result Date: 06/28/2020 CLINICAL DATA:  Bilateral carotid stenosis EXAM: CT ANGIOGRAPHY NECK TECHNIQUE: Multidetector CT imaging of the neck was performed using the standard protocol during bolus administration of intravenous contrast. Multiplanar CT image reconstructions and MIPs were obtained to evaluate the vascular anatomy. Carotid stenosis measurements (when applicable) are obtained utilizing NASCET criteria, using the distal internal carotid diameter as the denominator. CONTRAST:  32mL  ISOVUE-370 IOPAMIDOL (ISOVUE-370) INJECTION 76% COMPARISON:  None. FINDINGS: Skeleton: There is no bony spinal canal stenosis. No lytic or blastic lesion. Other neck: Normal pharynx, larynx and major salivary glands. No cervical lymphadenopathy. Unremarkable thyroid gland. Upper chest: No pneumothorax or pleural effusion. No nodules or masses. Aortic arch: There is no calcific atherosclerosis of the aortic arch. There is no aneurysm, dissection or hemodynamically significant stenosis of the visualized ascending aorta and aortic arch. Conventional 3 vessel aortic branching pattern. There is severe stenosis of the proximal right subclavian artery. Right carotid system: --Common carotid artery: Widely patent origin without common carotid artery dissection or aneurysm. --Internal carotid artery: No dissection, occlusion or aneurysm. There is non-calcified atherosclerotic disease at the bifurcation, extending into the internal carotid artery, resulting in less than 50% stenosis. --External carotid artery: No acute abnormality. Left carotid system: --Common carotid artery: Widely patent origin without common carotid artery dissection or aneurysm. --Internal carotid artery:At the left carotid bifurcation there is noncalcified plaque that causes a qualitatively severe stenosis that measures 80% by NASCET criteria. --External carotid artery: No acute abnormality. Vertebral arteries: Left dominant configuration. Both origins are normal. There is diminished enhancement of the right vertebral artery, worst proximally. Review of the MIP images confirms the above findings IMPRESSION: 1. 80% stenosis of the proximal left internal carotid artery secondary to noncalcified plaque. 2. 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  Aortic Atherosclerosis (ICD10-I70.0). Electronically Signed   By: Ulyses Jarred M.D.   On: 06/28/2020 20:13    Procedures .Critical Care Performed by: Alfredia Client, PA-C Authorized by: Alfredia Client, PA-C   Critical care provider statement:    Critical care time (minutes):  45   Critical care was necessary to treat or prevent imminent or life-threatening deterioration of the following conditions: severe hyponatremia    Critical care was time spent personally by me on the following activities:  Discussions with consultants, evaluation of patient's response to treatment, examination of patient, ordering and performing treatments and interventions, ordering and review of laboratory studies, ordering and review of radiographic studies, pulse oximetry, re-evaluation of patient's condition, obtaining history from patient or surrogate and review of old charts   (including critical care time)  Medications Ordered in ED Medications  ondansetron (ZOFRAN-ODT) disintegrating tablet 8 mg (8 mg Oral Given 06/30/20 0531)  sodium chloride 0.9 % bolus 1,000 mL (1,000 mLs Intravenous New Bag/Given 06/30/20 0949)    ED Course  I have reviewed the triage vital signs and the nursing notes.  Pertinent labs & imaging results that were available during my care of the patient were reviewed by me and considered in my medical decision making (see chart for details).    MDM Rules/Calculators/A&P                         Dalton Cardenas is a 86 y.o. male with pertinent past medical history of coronary artery disease, hypercholesteremia, hypertension, sleep apnea, SVT that presents emergency department today for nausea, vomiting and weakness. Patient with normal physical exam, no vomiting while being here for 6 hours. Patient was given Zofran in triage, states that he feels much better after this. Patient found to be hyponatremic to 119, will give normal saline at this time, will correct slowly. Patient will likely need to be admitted for this, think this is from vomiting and from  patient's spironolactone use. Normal abdominal exam, no abdominal pain.   First troponin 39, repeat 27. Chest x-ray without any acute cardiopulmonary disease. EKG does appear to be similar to previous EKGs, does appear to be in a flutter. Rate controlled. Covid swab negative. CBC without any leukocytosis. Patient also does appear to have AKI with creatinine 1.43, baseline arpund 1.   We will consult hospitalist this time for hyponatremia.   1224 spoke to Dr. Doristine Bosworth hospitalist who accept the patient.  The patient appears reasonably stabilized for admission considering the current resources, flow, and capabilities available in the ED at this time, and I doubt any other [facility] requiring further screening and/or treatment in the ED prior to admission.  I discussed this case with my attending physician who cosigned this note including patient's presenting symptoms, physical exam, and planned diagnostics and interventions. Attending physician stated agreement with plan or made changes to plan which were implemented.   Final Clinical Impression(s) / ED Diagnoses Final diagnoses:  Hyponatremia    Rx / DC Orders ED Discharge Orders    None       Alfredia Client, PA-C 06/30/20 1234    Veryl Speak, MD 06/30/20 [phone]

## 2020-06-30 NOTE — ED Notes (Addendum)
Pt EKG shows A-flutter, pt reports he has a hx of such. Notified provider. Pt asymptomatic

## 2020-06-30 NOTE — Progress Notes (Signed)
NEW ADMISSION NOTE New Admission Note:   Arrival Method: stretcher from ed Mental Orientation: alert and oriented x4 Telemetry:yes Assessment: Completed Skin:see skin assessment IV: LFA Pain:denies  Tubes:none Safety Measures: Safety Fall Prevention Plan has been given, discussed and signed Admission: Completed [facility] Orientation: Patient has been orientated to the room, unit and staff.  Family: none at bedside  Orders have been reviewed and implemented. Will continue to monitor the patient. Call light has been placed within reach and bed alarm has been activated.   Cleaven Demario S Aqua Denslow, RN

## 2020-06-30 NOTE — ED Triage Notes (Signed)
The pt is c/o abd pain nausea vomiting and sob all day since yesterday pt a and o x 4

## 2020-06-30 NOTE — H&P (Addendum)
History and Physical    Dalton Cardenas ELF:[id number] DOB: 05-29-1934 DOA: 06/30/2020  PCP: Leighton Ruff, MD  Patient coming from: home I have personally briefly reviewed patient's old medical records in [place]  Chief Complaint: N/V since yesterday  HPI: Dalton Cardenas is a 86 y.o. 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  Patient tells me that he recently diagnosed with right severe subclavian artery stenosis about 2 days ago and was referred to vascular surgery for further evaluation and since then he is stressed out, anxious and unable to sleep at night.  He took melatonin and Unisom last night to help with sleep.  He started having nausea and vomiting last night.  He vomited x2, nonbilious, nonbloody, associated with generalized weakness however denies fever, chills, abdominal pain, decreased appetite, urinary or bowel changes.  Denies headache, blurry vision, lightheadedness, dizziness, chest pain, shortness of breath, leg swelling, cough, congestion.  He has history of GERD for which he takes Protonix.  No history of smoking, alcohol, listed drug use.  He is compliant with his CPAP at night and other home medications.  He is followed by cardiology Dr. Einar Gip for his cardiac issues.  ED Course: Upon arrival to ED: Patient vital signs stable, afebrile with no leukocytosis.  BMP shows sodium of 119, AKI, initial troponin 39 trended down to 27, COVID-19 negative, chest x-ray negative for acute findings.  Patient was given normal saline 1000 cc of IV fluid bolus in ED.  [facility] hospitalist consulted for admission for hyponatremia.  Review of Systems: As per HPI otherwise negative.    Past  Medical History:  Diagnosis Date  . Coronary artery disease    60-70% circumflex (cath in [place] 08/2010).  . Hypercholesterolemia   . Hypertension   . Prostate cancer [facility])    status post prostatectomy  . Sleep apnea   . SVT (supraventricular tachycardia) ([place])    a. Holter monitoring 2011 - AV node reentry, multifocal atrial tachycardia, and resting bradycardia. b. previously tx with [facility] in [place].    Past Surgical History:  Procedure Laterality Date  . ABLATION OF DYSRHYTHMIC FOCUS  10/13/2014   SVT         by Dr Lovena Le  . ANAL FISSURE REPAIR    . ELECTROPHYSIOLOGY STUDY N/A 10/13/2014   Procedure: ELECTROPHYSIOLOGY STUDY;  Surgeon: Evans Lance, MD;  Location[facility] C[facility] LAB;  Service: Cardiovascular;  Laterality: N/A;  . PROSTATECTOMY    . SUPRAVENTRICULAR TACHYCARDIA ABLATION N/A 10/13/2014   Procedure: SUPRAVENTRICULAR TACHYCARDIA ABLATION;  Surgeon: Evans Lance, MD;  Location: [facility] CATH LAB;  Service: Cardiovascular;  Laterality: N/A;     reports that he has never smoked. He has never used smokeless tobacco. He reports that he does not drink alcohol and does not use drugs.  Allergies  Allergen Reactions  . Amoxicillin     GI upset: Bleeding diarrhea  Other reaction(s): GI Bleed  . Ibuprofen     Bloody diarrhea   . Penicillins     Bloody diarrhea   . Ace Inhibitors     Face swelling   . Amlodipine Swelling  . Atorvastatin     Weakness   . Azithromycin     Stomach ache   . Diltiazem Nausea Only    Upset stomach  per patient  . Hydrochlorothiazide Other (See Comments)    Photosensitivity    Family History  Problem Relation Age of Onset  . Hypertension Mother   . Heart disease Father   . Hypertension Sister   . Hypertension Brother   . Ulcers Sister   . Hypertension Sister   . Diabetes Neg Hx   . Coronary artery disease Neg Hx     Prior to Admission medications   Medication Sig Start Date End Date Taking? Authorizing Provider  amLODipine (NORVASC)  5 MG tablet Take 5 mg by mouth every evening.   Yes [provider]  b complex vitamins tablet Take 1 tablet by mouth daily.   Yes [provider]  cholecalciferol (VITAMIN D) 1000 UNITS tablet Take 1,000 Units by mouth daily.   Yes [provider]  Coenzyme Q10 (CO Q-10 PO) Take 1 capsule by mouth daily. (200mg )   Yes [provider]  cyanocobalamin 1000 MCG tablet Take 1,000 mcg by mouth daily.   Yes [provider]  doxylamine, Sleep, (UNISOM) 25 MG tablet Take 25 mg by mouth at bedtime as needed for sleep.   Yes [provider]  DULoxetine (CYMBALTA) 30 MG capsule Take 1 capsule (30 mg total) by mouth daily. 06/21/20  Yes Adrian Prows, MD  ELIQUIS 5 MG TABS tablet Take 1 tablet by mouth twice daily Patient taking differently: Take 5 mg by mouth 2 (two) times daily.  06/20/20  Yes Adrian Prows, MD  folic acid (FOLVITE) 322 MCG tablet Take 800 mcg by mouth daily.     Yes [provider]  hydrALAZINE (APRESOLINE) 25 MG tablet Take 1 tablet (25 mg total) by mouth 3 (three) times daily. Take one if BP >140/70, 2 tab if SBP>160 mm Hg 06/13/20 09/11/20 Yes Adrian Prows, MD  L-Arginine 1000 MG TABS Take 1 tablet by mouth daily.    Yes [provider]  LUTEIN PO Take 1 tablet by mouth daily.   Yes [provider]  Magnesium 400 MG TABS Take 400 mg by mouth daily.    Yes [provider]  meclizine (ANTIVERT) 12.5 MG tablet Take 1 tablet (12.5 mg total) by mouth 3 (three) times daily as needed for dizziness (Tinnitus). 06/21/20  Yes Adrian Prows, MD  metoprolol succinate (TOPROL-XL) 25 MG 24 hr tablet Take 1 tablet (25 mg total) by mouth daily. as directed Patient taking differently: Take 25 mg by mouth in the morning and at bedtime. as directed 06/13/20  Yes Adrian Prows, MD  Probiotic Product (ALIGN PO) Take 1 capsule by mouth daily.    Yes [provider]  rosuvastatin (CRESTOR) 10 MG tablet Take 10 mg by mouth daily.      Yes [provider]  spironolactone (ALDACTONE) 25 MG tablet Take 1 tablet (25 mg total) by mouth daily. 05/02/20 07/31/20 Yes Adrian Prows, MD  valsartan-hydrochlorothiazide (DIOVAN-HCT) 160-12.5 MG tablet Take 1 tablet by mouth in the morning.   Yes [provider]  vitamin C (ASCORBIC ACID) 500 MG tablet Take 500 mg by mouth daily.   Yes [provider]  zinc gluconate 50 MG tablet Take 50 mg by mouth daily.    Yes [provider]  valsartan (DIOVAN) 160 MG tablet Take 1 tablet (160 mg total) by mouth daily. Patient not taking: Reported on 06/30/2020 05/02/20   Adrian Prows, MD    Physical Exam: Vitals:   06/30/20 0511 06/30/20 0525 06/30/20 0814  BP: (!) 152/55  (!) 157/77  Pulse: (!) 56  (!) 56  Resp: 16  18  Temp: (!) 97.5 F (36.4 C)  97.8 F (36.6 C)  TempSrc: Oral  Oral  SpO2: 100%  100%  Weight:  73.9 kg   Height:  5\' 7"  (1.702 m)     Constitutional: NAD, calm, comfortable, on RA Eyes: PERRL, lids and conjunctivae normal ENMT: Mucous membranes are moist. Posterior pharynx clear of any exudate or lesions.Normal dentition.  Neck: normal, supple, no masses, no thyromegaly Respiratory: clear to auscultation bilaterally, no wheezing, no crackles. Normal respiratory effort. No accessory muscle use.  Cardiovascular: Regular rate and rhythm, no murmurs / rubs / gallops. No extremity edema. 2+ pedal pulses. No carotid bruits.  Abdomen: no tenderness, no masses palpated. No hepatosplenomegaly. Bowel sounds positive.  Musculoskeletal: no clubbing / cyanosis. No joint deformity upper and lower extremities. Good ROM, no contractures. Normal muscle tone.  Skin: no rashes, lesions, ulcers. No induration Neurologic: CN 2-12 grossly intact. Sensation intact, DTR normal. Strength 5/5 in all 4.  Psychiatric: Normal judgment and insight. Alert and oriented x 3. Normal mood.    Labs on Admission: I have personally reviewed following labs and imaging  studies  CBC: Recent Labs  Lab 06/30/20 0532  WBC 9.7  HGB 12.9*  HCT 37.5*  MCV 86.2  PLT 578   Basic Metabolic Panel: Recent Labs  Lab 06/30/20 0532  NA 119*  K 4.1  CL 90*  CO2 17*  GLUCOSE 154*  BUN 27*  CREATININE 1.43*  CALCIUM 8.6*   GFR: Estimated Creatinine Clearance: 36 mL/min (A) (by C-G formula based on SCr of 1.43 mg/dL (H)). Liver Function Tests: No results for input(s): AST, ALT, ALKPHOS, BILITOT, PROT, ALBUMIN in the last 168 hours. No results for input(s): LIPASE, AMYLASE in the last 168 hours. No results for input(s): AMMONIA in the last 168 hours. Coagulation Profile: No results for input(s): INR, PROTIME in the last 168 hours. Cardiac Enzymes: No results for input(s): CKTOTAL, CKMB, CKMBINDEX, TROPONINI in the last 168 hours. BNP (last 3 results) No results for input(s): PROBNP in the last 8760 hours. HbA1C: No results for input(s): HGBA1C in the last 72 hours. CBG: No results for input(s): GLUCAP in the last 168 hours. Lipid Profile: No results for input(s): CHOL, HDL, LDLCALC, TRIG, CHOLHDL, LDLDIRECT in the last 72 hours. Thyroid Function Tests: No results for input(s): TSH, T4TOTAL, FREET4, T3FREE, THYROIDAB in the last 72 hours. Anemia Panel: No results for input(s): VITAMINB12, FOLATE, FERRITIN, TIBC, IRON, RETICCTPCT in the last 72 hours. Urine analysis:    Component Value Date/Time   COLORURINE YELLOW 07/22/2012 [place] 07/22/2012 1218   LABSPEC 1.007 07/22/2012 1218   PHURINE 7.0 07/22/2012 [place] 07/22/2012 [place] 07/22/2012 [place] 07/22/2012 [place] 07/22/2012 1218   PROTEINUR NEGATIVE 07/22/2012 1218   UROBILINOGEN 0.2 07/22/2012 1218   NITRITE NEGATIVE 07/22/2012 1218   LEUKOCYTESUR NEGATIVE 07/22/2012 1218    Radiological Exams on Admission: DG Chest 2 View  Result Date: 06/30/2020 CLINICAL DATA:  Abdominal pain, vomiting,  shortness of breath EXAM: CHEST - 2 VIEW COMPARISON:  11/26/2019 FINDINGS: Lungs are clear.  No pleural effusion or pneumothorax. The heart is normal in size.  Thoracic aortic atherosclerosis. Degenerative changes of the visualized thoracolumbar spine. IMPRESSION: Normal chest radiographs. Electronically Signed   By: Julian Hy M.D.   On: 06/30/2020 06:14   CT ANGIO NECK W OR  WO CONTRAST  Result Date: 06/28/2020 CLINICAL DATA:  Bilateral carotid stenosis EXAM: CT ANGIOGRAPHY NECK TECHNIQUE: Multidetector CT imaging of the neck was performed using the standard protocol during bolus administration of intravenous contrast. Multiplanar CT image reconstructions and MIPs were obtained to evaluate the vascular anatomy. Carotid stenosis measurements (when applicable) are obtained utilizing NASCET criteria, using the distal internal carotid diameter as the denominator. CONTRAST:  64mL ISOVUE-370 IOPAMIDOL (ISOVUE-370) INJECTION 76% COMPARISON:  None. FINDINGS: Skeleton: There is no bony spinal canal stenosis. No lytic or blastic lesion. Other neck: Normal pharynx, larynx and major salivary glands. No cervical lymphadenopathy. Unremarkable thyroid gland. Upper chest: No pneumothorax or pleural effusion. No nodules or masses. Aortic arch: There is no calcific atherosclerosis of the aortic arch. There is no aneurysm, dissection or hemodynamically significant stenosis of the visualized ascending aorta and aortic arch. Conventional 3 vessel aortic branching pattern. There is severe stenosis of the proximal right subclavian artery. Right carotid system: --Common carotid artery: Widely patent origin without common carotid artery dissection or aneurysm. --Internal carotid artery: No dissection, occlusion or aneurysm. There is non-calcified atherosclerotic disease at the bifurcation, extending into the internal carotid artery, resulting in less than 50% stenosis. --External carotid artery: No acute abnormality. Left  carotid system: --Common carotid artery: Widely patent origin without common carotid artery dissection or aneurysm. --Internal carotid artery:At the left carotid bifurcation there is noncalcified plaque that causes a qualitatively severe stenosis that measures 80% by NASCET criteria. --External carotid artery: No acute abnormality. Vertebral arteries: Left dominant configuration. Both origins are normal. There is diminished enhancement of the right vertebral artery, worst proximally. Review of the MIP images confirms the above findings IMPRESSION: 1. 80% stenosis of the proximal left internal carotid artery secondary to noncalcified plaque. 2. 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 Aortic Atherosclerosis (ICD10-I70.0). Electronically Signed   By: Ulyses Jarred M.D.   On: 06/28/2020 20:13    EKG: Independently reviewed.  Sinus bradycardia with first-degree AV block.  Incomplete right bundle branch block.  Left anterior fascicular block.  Assessment/Plan Principal Problem:   Hyponatremia Active Problems:   HYPERCHOLESTEROLEMIA   Obstructive sleep apnea   Essential hypertension, benign   Coronary artery disease   AKI (acute kidney injury) (HCC)   Atrial flutter (HCC)   Hyponatremia: -secondary to hypovolemia due to nausea and vomiting.  Received IV fluid bolus in ED.  Patient is alert and oriented.  He is not in fluid overload.  No history of excessive fluid intake. -Admit patient on the floor.  On telemetry. -Check urine sodium, serum osmolality, TSH, lipase, UA, magnesium -BMP every 6 hours. -Will monitor sodium to avoid overcorrection (>29mEq/L/day) so as to avoid central pontine myelinolysis.  Hypertension: Stable -Continue hydralazine, hold valsartan and Aldactone due to AKI and metoprolol due to bradycardia.  His metoprolol dose decreased from 50 mg to 25  mg twice daily by his cardiologist last month due to bradycardia.  AKI: Likely secondary to dehydration -Receive IV fluid in ED.  Avoid nephrotoxic medication-hold valsartan and Aldactone.  Repeat BMP tomorrow a.m.  Atrial flutter: Reviewed EKG.  Monitor closely on telemetry. -Continue Eliquis, hold metoprolol due to bradycardia.  Obstructive sleep apnea: On CPAP -Continue CPAP at bedtime  Hyperlipidemia: Continue statin  Depression/anxiety: Continue Cymbalta  Severe Right subclavian artery stenosis: Recently diagnosed.  Reviewed CTA neck from 10/12.  Patient was referred to vascular surgery Dr. Oneida Alar for  further evaluation.   DVT prophylaxis: Eliquis/SCD  Code Status: Full code  Family Communication:  Patient's wifeNone present at bedside.  Plan of care discussed with patient in length and he verbalized understanding and agreed with it. Disposition Plan: Home in 1-2 days Consults called: None  Admission status: Inpatient.  Mckinley Jewel MD [facility]  If 7PM-7AM, please contact night-coverage [email or web address] Password [email or web address]  06/30/2020, 1:08 PM

## 2020-07-01 ENCOUNTER — Encounter (HOSPITAL_COMMUNITY): Payer: Self-pay | Admitting: Internal Medicine

## 2020-07-01 ENCOUNTER — Inpatient Hospital Stay (HOSPITAL_COMMUNITY): Payer: Medicare Other | Admitting: Certified Registered Nurse Anesthetist

## 2020-07-01 ENCOUNTER — Encounter (HOSPITAL_COMMUNITY)
Admission: EM | Disposition: A | Discharge status: Home / Self Care | Payer: Self-pay | Source: Home / Self Care | Attending: Internal Medicine

## 2020-07-01 DIAGNOSIS — E871 Hypo-osmolality and hyponatremia: Secondary | ICD-10-CM | POA: Diagnosis not present

## 2020-07-01 HISTORY — PX: CARDIOVERSION: SHX1299

## 2020-07-01 LAB — COMPREHENSIVE METABOLIC PANEL
ALT: 24 U/L (ref 0–44)
AST: 27 U/L (ref 15–41)
Albumin: 2.8 g/dL — ABNORMAL LOW (ref 3.5–5.0)
Alkaline Phosphatase: 43 U/L (ref 38–126)
Anion gap: 7 (ref 5–15)
BUN: 22 mg/dL (ref 8–23)
CO2: 21 mmol/L — ABNORMAL LOW (ref 22–32)
Calcium: 8.4 mg/dL — ABNORMAL LOW (ref 8.9–10.3)
Chloride: 100 mmol/L (ref 98–111)
Creatinine, Ser: 1.35 mg/dL — ABNORMAL HIGH (ref 0.61–1.24)
GFR, Estimated: 48 mL/min — ABNORMAL LOW (ref >60)
Glucose, Bld: 115 mg/dL — ABNORMAL HIGH (ref 70–99)
Potassium: 4.9 mmol/L (ref 3.5–5.1)
Sodium: 128 mmol/L — ABNORMAL LOW (ref 135–145)
Total Bilirubin: 1 mg/dL (ref 0.3–1.2)
Total Protein: 5.6 g/dL — ABNORMAL LOW (ref 6.5–8.1)

## 2020-07-01 LAB — URINALYSIS, ROUTINE W REFLEX MICROSCOPIC
Bilirubin Urine: NEGATIVE
Glucose, UA: NEGATIVE mg/dL
Hgb urine dipstick: NEGATIVE
Ketones, ur: 5 mg/dL — AB
Leukocytes,Ua: NEGATIVE
Nitrite: NEGATIVE
Protein, ur: NEGATIVE mg/dL
Specific Gravity, Urine: 1.011 (ref 1.005–1.030)
pH: 5 (ref 5.0–8.0)

## 2020-07-01 LAB — OSMOLALITY, URINE: Osmolality, Ur: 393 mOsm/kg (ref 300–900)

## 2020-07-01 LAB — CBC
HCT: 34.9 % — ABNORMAL LOW (ref 39.0–52.0)
Hemoglobin: 12.2 g/dL — ABNORMAL LOW (ref 13.0–17.0)
MCH: 30.2 pg (ref 26.0–34.0)
MCHC: 35 g/dL (ref 30.0–36.0)
MCV: 86.4 fL (ref 80.0–100.0)
Platelets: 276 10*3/uL (ref 150–400)
RBC: 4.04 MIL/uL — ABNORMAL LOW (ref 4.22–5.81)
RDW: 12.3 % (ref 11.5–15.5)
WBC: 9 10*3/uL (ref 4.0–10.5)
nRBC: 0 % (ref 0.0–0.2)

## 2020-07-01 LAB — BASIC METABOLIC PANEL
Anion gap: 7 (ref 5–15)
BUN: 19 mg/dL (ref 8–23)
CO2: 21 mmol/L — ABNORMAL LOW (ref 22–32)
Calcium: 8.7 mg/dL — ABNORMAL LOW (ref 8.9–10.3)
Chloride: 102 mmol/L (ref 98–111)
Creatinine, Ser: 1.29 mg/dL — ABNORMAL HIGH (ref 0.61–1.24)
GFR, Estimated: 51 mL/min — ABNORMAL LOW (ref >60)
Glucose, Bld: 106 mg/dL — ABNORMAL HIGH (ref 70–99)
Potassium: 4.3 mmol/L (ref 3.5–5.1)
Sodium: 130 mmol/L — ABNORMAL LOW (ref 135–145)

## 2020-07-01 LAB — OSMOLALITY: Osmolality: 278 mOsm/kg (ref 275–295)

## 2020-07-01 LAB — BRAIN NATRIURETIC PEPTIDE: B Natriuretic Peptide: 726.8 pg/mL — ABNORMAL HIGH (ref 0.0–100.0)

## 2020-07-01 LAB — SODIUM, URINE, RANDOM: Sodium, Ur: 33 mmol/L

## 2020-07-01 SURGERY — CARDIOVERSION
Anesthesia: General

## 2020-07-01 MED ORDER — METOPROLOL SUCCINATE ER 25 MG PO TB24
25.0000 mg | ORAL_TABLET | Freq: Every day | ORAL | Status: DC
  Administered 2020-07-02: 25 mg via ORAL
  Filled 2020-07-01: qty 1

## 2020-07-01 MED ORDER — SODIUM CHLORIDE 0.9 % IV SOLN: INTRAVENOUS | Status: DC

## 2020-07-01 MED ORDER — METOPROLOL TARTRATE 5 MG/5ML IV SOLN
5.0000 mg | Freq: Once | INTRAVENOUS | Status: AC
  Administered 2020-07-01: 5 mg via INTRAVENOUS

## 2020-07-01 MED ORDER — LIDOCAINE 2% (20 MG/ML) 5 ML SYRINGE
INTRAMUSCULAR | Status: DC | PRN
  Administered 2020-07-01: 20 mg via INTRAVENOUS

## 2020-07-01 MED ORDER — METOPROLOL TARTRATE 5 MG/5ML IV SOLN
INTRAVENOUS | Status: AC
  Filled 2020-07-01: qty 5

## 2020-07-01 MED ORDER — PROPOFOL 10 MG/ML IV BOLUS
INTRAVENOUS | Status: DC | PRN
  Administered 2020-07-01: 60 mg via INTRAVENOUS

## 2020-07-01 NOTE — H&P (View-Only) (Signed)
CARDIOLOGY CONSULT NOTE  Patient ID: NAVARRE DIANA MRN: [id number] DOB/AGE: 12/11/1933 86 y.o.  Admit date: 06/30/2020 Referring Physician  Domenic Polite, MD Primary Physician:  Leighton Ruff, MD Reason for Consultation  A. Flutter  Patient ID: MAXXON SCHWANKE, male    DOB: 1934-05-15, 86 y.o.   MRN: [id number]  Chief Complaint  Patient presents with  . Abdominal Pain  . Shortness of Breath   HPI:    HASEEB FIALLOS  is a 86 y.o. 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  Patient also had new onset of atypical atrial flutter and I was consulted for further management of the same.  Patient is presently feeling better and states that his abdominal discomfort is resolved,  denies shortness of breath. No PND or orthopnea, no leg edema.   Past Medical History:  Diagnosis Date  . Coronary artery disease    60-70% circumflex (cath in [place] 08/2010).  . Hypercholesterolemia   . Hypertension   . Prostate cancer [facility])    status post prostatectomy  . Sleep apnea   . 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 SVT (supraventricular tachycardia) ([place])    a. Holter monitoring 2011 - AV node reentry, multifocal atrial tachycardia, and resting bradycardia. b. previously tx with [place] in [place].   Past Surgical History:  Procedure Laterality Date  . ABLATION OF DYSRHYTHMIC FOCUS  10/13/2014   SVT         by Dr Lovena Le  . ANAL FISSURE REPAIR    .  ELECTROPHYSIOLOGY STUDY N/A 10/13/2014   Procedure: ELECTROPHYSIOLOGY STUDY;  Surgeon: Evans Lance, MD;  Location: [facility] CATH LAB;  Service: Cardiovascular;  Laterality: N/A;  . PROSTATECTOMY    . SUPRAVENTRICULAR TACHYCARDIA ABLATION N/A 10/13/2014   Procedure: SUPRAVENTRICULAR TACHYCARDIA ABLATION;  Surgeon: Evans Lance, MD;  Location: [facility] CATH LAB;  Service: Cardiovascular;  Laterality: N/A;   Social History   Tobacco Use  . Smoking status: Never Smoker  . Smokeless tobacco: Never Used  Substance Use Topics  . Alcohol use: No    Marital Sttus: Married  ROS  Review of Systems  Cardiovascular: Negative for chest pain, dyspnea on exertion and leg swelling.  Gastrointestinal: Negative for melena.  All other systems reviewed and are negative.  Objective   Vitals with BMI 07/01/2020 06/30/2020 06/30/2020  Height - - 5\' 7"   Weight - - 163 lbs 2 oz  BMI - - 50.53  Systolic 976 - 734  Diastolic 71 - 73  Pulse 47 61 58    Blood pressure 109/71, pulse (!) 47, temperature 98.4 F (36.9 C), resp. rate 17, height 5\' 7"  (1.702 m), weight 74 kg, SpO2 94 %.    Physical Exam Vitals reviewed.  Constitutional:      Appearance: He is well-developed.  HENT:     Head: Atraumatic.  Cardiovascular:     Rate and Rhythm: Normal rate and regular rhythm.     Pulses: Intact distal pulses.     Heart sounds: S1 normal and S2  normal. Murmur heard.  No gallop.      Comments: No edema. No JVD.  Pulmonary:     Effort: Pulmonary effort is normal.     Breath sounds: Normal breath sounds.  Abdominal:     General: Abdomen is flat. Bowel sounds are normal. There is no distension.     Palpations: Abdomen is soft.     Tenderness: There is no abdominal tenderness.  Skin:    General: Skin is warm and dry.  Neurological:     General: No focal deficit present.     Mental Status: He is alert.  Psychiatric:        Mood and Affect: Mood normal.    Laboratory examination:   Recent Labs     06/10/20 1637 06/30/20 0532 06/30/20 1600 06/30/20 2101 07/01/20 0209  NA 133*   < > 126* 127* 128*  K 5.1   < > 4.5 4.5 4.9  CL 99   < > 97* 99 100  CO2 23   < > 21* 21* 21*  GLUCOSE 87   < > 118* 131* 115*  BUN 18   < > 19 21 22   CREATININE 1.07   < > 1.11 1.17 1.35*  CALCIUM 9.5   < > 8.5* 8.5* 8.4*  GFRNONAA 63   < > >60 57* 48*  GFRAA 73  --   --   --   --    < > = values in this interval not displayed.   estimated creatinine clearance is 38.1 mL/min (A) (by C-G formula based on SCr of 1.35 mg/dL (H)).  CMP Latest Ref Rng & Units 07/01/2020 06/30/2020 06/30/2020  Glucose 70 - 99 mg/dL 115(H) 131(H) 118(H)  BUN 8 - 23 mg/dL 22 21 19   Creatinine 0.61 - 1.24 mg/dL 1.35(H) 1.17 1.11  Sodium 135 - 145 mmol/L 128(L) 127(L) 126(L)  Potassium 3.5 - 5.1 mmol/L 4.9 4.5 4.5  Chloride 98 - 111 mmol/L 100 99 97(L)  CO2 22 - 32 mmol/L 21(L) 21(L) 21(L)  Calcium 8.9 - 10.3 mg/dL 8.4(L) 8.5(L) 8.5(L)  Total Protein 6.5 - 8.1 g/dL 5.6(L) - -  Total Bilirubin 0.3 - 1.2 mg/dL 1.0 - -  Alkaline Phos 38 - 126 U/L 43 - -  AST 15 - 41 U/L 27 - -  ALT 0 - 44 U/L 24 - -   CBC Latest Ref Rng & Units 07/01/2020 06/30/2020 10/07/2014  WBC 4.0 - 10.5 K/uL 9.0 9.7 6.8  Hemoglobin 13.0 - 17.0 g/dL 12.2(L) 12.9(L) 13.3  Hematocrit 39 - 52 % 34.9(L) 37.5(L) 38.5(L)  Platelets 150 - 400 K/uL 276 321 224.0   Lipid Panel No results for input(s): CHOL, TRIG, LDLCALC, VLDL, HDL, CHOLHDL, LDLDIRECT in the last 8760 hours.  HEMOGLOBIN A1C No results found for: HGBA1C, MPG TSH Recent Labs    06/30/20 1406  TSH 1.774   BNP (last 3 results) Recent Labs    06/30/20 1406 07/01/20 0209  BNP 622.0* 726.8*    Medications and allergies   Allergies  Allergen Reactions  . Amoxicillin     GI upset: Bleeding diarrhea  Other reaction(s): GI Bleed  . Ibuprofen     Bloody diarrhea   . Penicillins     Bloody diarrhea   . Ace Inhibitors     Face swelling   . Amlodipine Swelling  . Atorvastatin      Weakness   . Azithromycin     Stomach ache   . Diltiazem Nausea Only  Upset stomach per patient  . Hydrochlorothiazide Other (See Comments)    Photosensitivity     Current Meds  Medication Sig  . b complex vitamins tablet Take 1 tablet by mouth daily.  . cholecalciferol (VITAMIN D) 1000 UNITS tablet Take 1,000 Units by mouth daily.  . Coenzyme Q10 (CO Q-10 PO) Take 1 capsule by mouth daily. (200mg )  . doxylamine, Sleep, (UNISOM) 25 MG tablet Take 25 mg by mouth at bedtime as needed for sleep.  . DULoxetine (CYMBALTA) 30 MG capsule Take 1 capsule (30 mg total) by mouth daily.  Marland Kitchen ELIQUIS 5 MG TABS tablet Take 1 tablet by mouth twice daily (Patient taking differently: Take 5 mg by mouth 2 (two) times daily. )  . folic acid (FOLVITE) 062 MCG tablet Take 800 mcg by mouth daily.    . hydrALAZINE (APRESOLINE) 25 MG tablet Take 1 tablet (25 mg total) by mouth 3 (three) times daily. Take one if BP >140/70, 2 tab if SBP>160 mm Hg  . L-Arginine 1000 MG TABS Take 1 tablet by mouth daily.   . LUTEIN PO Take 1 tablet by mouth daily.  . Magnesium 400 MG TABS Take 400 mg by mouth daily.   . metoprolol succinate (TOPROL-XL) 25 MG 24 hr tablet Take 1 tablet (25 mg total) by mouth daily. as directed (Patient taking differently: Take 25 mg by mouth in the morning and at bedtime. as directed)  . omega-3 acid ethyl esters (LOVAZA) 1 g capsule Take 1 g by mouth daily.  . Probiotic Product (ALIGN PO) Take 1 capsule by mouth daily.   . PSYLLIUM PO Take 1 tablet by mouth daily.  . rosuvastatin (CRESTOR) 10 MG tablet Take 10 mg by mouth daily.    Marland Kitchen spironolactone (ALDACTONE) 25 MG tablet Take 1 tablet (25 mg total) by mouth daily.  . valsartan (DIOVAN) 160 MG tablet Take 1 tablet (160 mg total) by mouth daily.  . vitamin C (ASCORBIC ACID) 500 MG tablet Take 500 mg by mouth daily.  Marland Kitchen zinc gluconate 50 MG tablet Take 50 mg by mouth daily.     Scheduled Meds: . amLODipine  5 mg Oral QPM  . apixaban  5  mg Oral BID  . DULoxetine  30 mg Oral Daily  . folic acid  1 mg Oral Daily  . hydrALAZINE  25 mg Oral TID  . rosuvastatin  10 mg Oral Daily   Continuous Infusions: PRN Meds:.acetaminophen **OR** acetaminophen, hydrALAZINE, meclizine, ondansetron **OR** ondansetron (ZOFRAN) IV   I/O last 3 completed shifts: In: 1168.1 [P.O.:120; I.V.:48.1; IV Piggyback:1000] Out: -  Total I/O In: 0  Out: 800 [Urine:800]    Radiology:   DG Chest 2 View  Result Date: 06/30/2020 CLINICAL DATA:  Abdominal pain, vomiting, shortness of breath EXAM: CHEST - 2 VIEW COMPARISON:  11/26/2019 FINDINGS: Lungs are clear.  No pleural effusion or pneumothorax. The heart is normal in size.  Thoracic aortic atherosclerosis. Degenerative changes of the visualized thoracolumbar spine. IMPRESSION: Normal chest radiographs. Electronically Signed   By: Julian Hy M.D.   On: 06/30/2020 06:14    Cardiac Studies:   Coronary Angiogram  [2011]: Performed in [place], 70% stenosis in the circumflex coronary artery. Was performed as a part of workup for EP study for the SVT.  Nuclear stress test  [04/26/2010]: Myocardial perfusion scan 04/26/2010: Normal perfusion, EF 77%.  EP study and catheter ablation of AVNRT using Isuprel  [10/13/2014]: Successful slow pathway ablation of AVNRT in a patient with non-sustained AVNRT  and a h/o incessant AVNRT.  Echocardiogram 05/14/2017: Left ventricle cavity is normal in size. Moderate asymmetric hypertrophy of the left ventricle. Basal septum measures at 2.0 cm Hyperdynamic LV with functional LVOT peak gradient 5 mmHg without SAM or LVOT obstruction Visual LVEF 65-70%. Left atrial cavity is moderately dilated. AP measurement 4.4 cm. 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 Mild mitral and tricuspid regurgitation. Pulmonary artery systolic pressure 34-19 mmhg. No significant change compared to prior study dated 02/16/2016  EKG:   EKG  05/02/2020: Sinus rhythm with first-degree AV block at rate of 71 bpm, left atrial enlargement, left axis deviation, left anterior fascicular block.  Poor R wave progression, cannot exclude anteroseptal infarct old.  No evidence of ischemia.  No significant change from 10/22/2019.  EKG 06/30/2020: Atypical atrial flutter with 3: 1 conduction, ventricular rate 57 bpm.  Left axis deviation, left intrafascicular block.  Borderline criteria for LVH.  Incomplete right bundle branch block.  Poor R wave progression, cannot exclude anteroseptal infarct old.  Nonspecific T abnormality.  Assessment   1.  Acute diastolic heart failure 2.  Hyponatremia due to volume excess 3.  Paroxysmal atrial fibrillation, patient now in atypical atrial flutter with controlled ventricular response. CHA2DS2-VASc Score is 4.  Yearly risk of stroke: 4.8% (A, HTN, Vasc Dz).  Score of 1=0.6; 2=2.2; 3=3.2; 4=4.8; 5=7.2; 6=9.8; 7=>9.8) -(CHF; HTN; vasc disease DM,  Male = 1; Age <65 =0; 65-74 = 1,  >75 =2; stroke/embolism= 2).   4.  Asymptomatic bilateral carotid artery stenosis, left high-grade. 5.  Primary hypertension 6.  Hyperlipidemia 7.  Acute renal failure due to cardiorenal syndrome and contrast nephropathy related to recent carotid angiography.  Medications Discontinued During This Encounter  Medication Reason  . 0.9 %  sodium chloride infusion   . amLODipine (NORVASC) 5 MG tablet Discontinued by provider  . valsartan-hydrochlorothiazide (DIOVAN-HCT) 160-12.5 MG tablet Patient Preference  . cyanocobalamin 6222 MCG tablet Duplicate    Recommendations:   Patient will need diuretic therapy to improve his sodium, has severely reduced serum osmolality with low sodium, urinary sodium and urinary osmolality was not performed unfortunately although orders were written.I suspect hypervolemic hyponatremia.  He will need to be started on diuretics.  Suspect his renal function will improve, continue to hold ARB. However physical  exam he appears euvolumic. Would probably repeat S. Osm and U. Sp gr and osm to guide diuretic therapy. Also low Na may been related to recent aldactone which is now on hold. D/W Dr. Broadus John in person  Blood pressure is relatively well controlled today, continue present medications.  On appropriate medical therapy for hyperlipidemia as well.  With regard to atypical atrial flutter, he is on appropriately anticoagulated and we will set him up for direct-current cardioversion today. Schedule for Direct current cardioversion. I have discussed regarding risks benefits rate control vs rhythm control with the patient. Patient understands cardiac arrest and need for CPR, aspiration pneumonia, but not limited to these. Patient is willing.     Adrian Prows, MD, [facility] 07/01/2020, 6:32 AM Office: [phone]

## 2020-07-01 NOTE — Anesthesia Procedure Notes (Signed)
Procedure Name: General with mask airway Date/Time: 07/01/2020 12:28 PM Performed by: Leonor Liv, CRNA Pre-anesthesia Checklist: Patient identified, Emergency Drugs available, Suction available and Patient being monitored Patient Re-evaluated:Patient Re-evaluated prior to induction Oxygen Delivery Method: Ambu bag Dental Injury: Teeth and Oropharynx as per pre-operative assessment

## 2020-07-01 NOTE — Anesthesia Postprocedure Evaluation (Signed)
Anesthesia Post Note  Patient: Alford D Goldberger  Procedure(s) Performed: CARDIOVERSION (N/A )     Patient location during evaluation: PACU Anesthesia Type: General Level of consciousness: awake and alert Pain management: pain level controlled Vital Signs Assessment: post-procedure vital signs reviewed and stable Respiratory status: spontaneous breathing, nonlabored ventilation, respiratory function stable and patient connected to nasal cannula oxygen Cardiovascular status: blood pressure returned to baseline and stable Postop Assessment: no apparent nausea or vomiting Anesthetic complications: no   No complications documented.  Last Vitals:  Vitals:   07/01/20 1250 07/01/20 1319  BP: (!) 131/57 (!) 154/66  Pulse:  60  Resp:  18  Temp:  (!) 36.3 C  SpO2:  99%    Last Pain:  Vitals:   07/01/20 1500  TempSrc:   PainSc: 0-No pain                 Effie Berkshire

## 2020-07-01 NOTE — Plan of Care (Signed)
  Problem: Education: Goal: Knowledge of General Education information will improve Description: Including pain rating scale, medication(s)/side effects and non-pharmacologic comfort measures Outcome: Progressing   Problem: Clinical Measurements: Goal: Cardiovascular complication will be avoided Outcome: Progressing   Problem: Coping: Goal: Level of anxiety will decrease Outcome: Progressing   

## 2020-07-01 NOTE — Consult Note (Signed)
CARDIOLOGY CONSULT NOTE  Patient ID: Dalton Cardenas MRN: [id number] DOB/AGE: 84/84/1935 84 y.o.  Admit date: 06/30/2020 Referring Physician  Domenic Polite, MD Primary Physician:  Leighton Ruff, MD Reason for Consultation  A. Flutter  Patient ID: Dalton Cardenas, male    DOB: 1934/02/11, 86 y.o.   MRN: [id number]  Chief Complaint  Patient presents with  . Abdominal Pain  . Shortness of Breath   HPI:    Dalton Cardenas  is a 86 y.o. 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  Patient also had new onset of atypical atrial flutter and I was consulted for further management of the same.  Patient is presently feeling better and states that his abdominal discomfort is resolved,  denies shortness of breath. No PND or orthopnea, no leg edema.   Past Medical History:  Diagnosis Date  . Coronary artery disease    60-70% circumflex (cath in [place] 08/2010).  . Hypercholesterolemia   . Hypertension   . Prostate cancer [facility])    status post prostatectomy  . Sleep apnea   . 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 SVT (supraventricular tachycardia) ([facility])    a. Holter monitoring 2011 - AV node reentry, multifocal atrial tachycardia, and resting bradycardia. b. previously tx with [facility] in [place].   Past Surgical History:  Procedure Laterality Date  . ABLATION OF DYSRHYTHMIC FOCUS  10/13/2014   SVT         by Dr Lovena Le  . ANAL FISSURE REPAIR    .  ELECTROPHYSIOLOGY STUDY N/A 10/13/2014   Procedure: ELECTROPHYSIOLOGY STUDY;  Surgeon: Evans Lance, MD;  Location[facility] CATH LAB;  Service: Cardiovascular;  Laterality: N/A;  . PROSTATECTOMY    . SUPRAVENTRICULAR TACHYCARDIA ABLATION N/A 10/13/2014   Procedure: SUPRAVENTRICULAR TACHYCARDIA ABLATION;  Surgeon: Evans Lance, MD;  Location: [facility] CATH LAB;  Service: Cardiovascular;  Laterality: N/A;   Social History   Tobacco Use  . Smoking status: Never Smoker  . Smokeless tobacco: Never Used  Substance Use Topics  . Alcohol use: No    Marital Sttus: Married  ROS  Review of Systems  Cardiovascular: Negative for chest pain, dyspnea on exertion and leg swelling.  Gastrointestinal: Negative for melena.  All other systems reviewed and are negative.  Objective   Vitals with BMI 07/01/2020 06/30/2020 06/30/2020  Height - - 5\' 7"   Weight - - 163 lbs 2 oz  BMI - - 02.63  Systolic 785 - 885  Diastolic 71 - 73  Pulse 47 61 58    Blood pressure 109/71, pulse (!) 47, temperature 98.4 F (36.9 C), resp. rate 17, height 5\' 7"  (1.702 m), weight 74 kg, SpO2 94 %.    Physical Exam Vitals reviewed.  Constitutional:      Appearance: He is well-developed.  HENT:     Head: Atraumatic.  Cardiovascular:     Rate and Rhythm: Normal rate and regular rhythm.     Pulses: Intact distal pulses.     Heart sounds: S1 normal and S2  normal. Murmur heard.  No gallop.      Comments: No edema. No JVD.  Pulmonary:     Effort: Pulmonary effort is normal.     Breath sounds: Normal breath sounds.  Abdominal:     General: Abdomen is flat. Bowel sounds are normal. There is no distension.     Palpations: Abdomen is soft.     Tenderness: There is no abdominal tenderness.  Skin:    General: Skin is warm and dry.  Neurological:     General: No focal deficit present.     Mental Status: He is alert.  Psychiatric:        Mood and Affect: Mood normal.    Laboratory examination:   Recent Labs     06/10/20 1637 06/30/20 0532 06/30/20 1600 06/30/20 2101 07/01/20 0209  NA 133*   < > 126* 127* 128*  K 5.1   < > 4.5 4.5 4.9  CL 99   < > 97* 99 100  CO2 23   < > 21* 21* 21*  GLUCOSE 87   < > 118* 131* 115*  BUN 18   < > 19 21 22   CREATININE 1.07   < > 1.11 1.17 1.35*  CALCIUM 9.5   < > 8.5* 8.5* 8.4*  GFRNONAA 63   < > >60 57* 48*  GFRAA 73  --   --   --   --    < > = values in this interval not displayed.   estimated creatinine clearance is 38.1 mL/min (A) (by C-G formula based on SCr of 1.35 mg/dL (H)).  CMP Latest Ref Rng & Units 07/01/2020 06/30/2020 06/30/2020  Glucose 70 - 99 mg/dL 115(H) 131(H) 118(H)  BUN 8 - 23 mg/dL 22 21 19   Creatinine 0.61 - 1.24 mg/dL 1.35(H) 1.17 1.11  Sodium 135 - 145 mmol/L 128(L) 127(L) 126(L)  Potassium 3.5 - 5.1 mmol/L 4.9 4.5 4.5  Chloride 98 - 111 mmol/L 100 99 97(L)  CO2 22 - 32 mmol/L 21(L) 21(L) 21(L)  Calcium 8.9 - 10.3 mg/dL 8.4(L) 8.5(L) 8.5(L)  Total Protein 6.5 - 8.1 g/dL 5.6(L) - -  Total Bilirubin 0.3 - 1.2 mg/dL 1.0 - -  Alkaline Phos 38 - 126 U/L 43 - -  AST 15 - 41 U/L 27 - -  ALT 0 - 44 U/L 24 - -   CBC Latest Ref Rng & Units 07/01/2020 06/30/2020 10/07/2014  WBC 4.0 - 10.5 K/uL 9.0 9.7 6.8  Hemoglobin 13.0 - 17.0 g/dL 12.2(L) 12.9(L) 13.3  Hematocrit 39 - 52 % 34.9(L) 37.5(L) 38.5(L)  Platelets 150 - 400 K/uL 276 321 224.0   Lipid Panel No results for input(s): CHOL, TRIG, LDLCALC, VLDL, HDL, CHOLHDL, LDLDIRECT in the last 8760 hours.  HEMOGLOBIN A1C No results found for: HGBA1C, MPG TSH Recent Labs    06/30/20 1406  TSH 1.774   BNP (last 3 results) Recent Labs    06/30/20 1406 07/01/20 0209  BNP 622.0* 726.8*    Medications and allergies   Allergies  Allergen Reactions  . Amoxicillin     GI upset: Bleeding diarrhea  Other reaction(s): GI Bleed  . Ibuprofen     Bloody diarrhea   . Penicillins     Bloody diarrhea   . Ace Inhibitors     Face swelling   . Amlodipine Swelling  . Atorvastatin      Weakness   . Azithromycin     Stomach ache   . Diltiazem Nausea Only  Upset stomach per patient  . Hydrochlorothiazide Other (See Comments)    Photosensitivity     Current Meds  Medication Sig  . b complex vitamins tablet Take 1 tablet by mouth daily.  . cholecalciferol (VITAMIN D) 1000 UNITS tablet Take 1,000 Units by mouth daily.  . Coenzyme Q10 (CO Q-10 PO) Take 1 capsule by mouth daily. (200mg )  . doxylamine, Sleep, (UNISOM) 25 MG tablet Take 25 mg by mouth at bedtime as needed for sleep.  . DULoxetine (CYMBALTA) 30 MG capsule Take 1 capsule (30 mg total) by mouth daily.  Marland Kitchen ELIQUIS 5 MG TABS tablet Take 1 tablet by mouth twice daily (Patient taking differently: Take 5 mg by mouth 2 (two) times daily. )  . folic acid (FOLVITE) 324 MCG tablet Take 800 mcg by mouth daily.    . hydrALAZINE (APRESOLINE) 25 MG tablet Take 1 tablet (25 mg total) by mouth 3 (three) times daily. Take one if BP >140/70, 2 tab if SBP>160 mm Hg  . L-Arginine 1000 MG TABS Take 1 tablet by mouth daily.   . LUTEIN PO Take 1 tablet by mouth daily.  . Magnesium 400 MG TABS Take 400 mg by mouth daily.   . metoprolol succinate (TOPROL-XL) 25 MG 24 hr tablet Take 1 tablet (25 mg total) by mouth daily. as directed (Patient taking differently: Take 25 mg by mouth in the morning and at bedtime. as directed)  . omega-3 acid ethyl esters (LOVAZA) 1 g capsule Take 1 g by mouth daily.  . Probiotic Product (ALIGN PO) Take 1 capsule by mouth daily.   . PSYLLIUM PO Take 1 tablet by mouth daily.  . rosuvastatin (CRESTOR) 10 MG tablet Take 10 mg by mouth daily.    Marland Kitchen spironolactone (ALDACTONE) 25 MG tablet Take 1 tablet (25 mg total) by mouth daily.  . valsartan (DIOVAN) 160 MG tablet Take 1 tablet (160 mg total) by mouth daily.  . vitamin C (ASCORBIC ACID) 500 MG tablet Take 500 mg by mouth daily.  Marland Kitchen zinc gluconate 50 MG tablet Take 50 mg by mouth daily.     Scheduled Meds: . amLODipine  5 mg Oral QPM  . apixaban  5  mg Oral BID  . DULoxetine  30 mg Oral Daily  . folic acid  1 mg Oral Daily  . hydrALAZINE  25 mg Oral TID  . rosuvastatin  10 mg Oral Daily   Continuous Infusions: PRN Meds:.acetaminophen **OR** acetaminophen, hydrALAZINE, meclizine, ondansetron **OR** ondansetron (ZOFRAN) IV   I/O last 3 completed shifts: In: 1168.1 [P.O.:120; I.V.:48.1; IV Piggyback:1000] Out: -  Total I/O In: 0  Out: 800 [Urine:800]    Radiology:   DG Chest 2 View  Result Date: 06/30/2020 CLINICAL DATA:  Abdominal pain, vomiting, shortness of breath EXAM: CHEST - 2 VIEW COMPARISON:  11/26/2019 FINDINGS: Lungs are clear.  No pleural effusion or pneumothorax. The heart is normal in size.  Thoracic aortic atherosclerosis. Degenerative changes of the visualized thoracolumbar spine. IMPRESSION: Normal chest radiographs. Electronically Signed   By: Julian Hy M.D.   On: 06/30/2020 06:14    Cardiac Studies:   Coronary Angiogram  [2011]: Performed in [place], 70% stenosis in the circumflex coronary artery. Was performed as a part of workup for EP study for the SVT.  Nuclear stress test  [04/26/2010]: Myocardial perfusion scan 04/26/2010: Normal perfusion, EF 77%.  EP study and catheter ablation of AVNRT using Isuprel  [10/13/2014]: Successful slow pathway ablation of AVNRT in a patient with non-sustained AVNRT  and a h/o incessant AVNRT.  Echocardiogram 05/14/2017: Left ventricle cavity is normal in size. Moderate asymmetric hypertrophy of the left ventricle. Basal septum measures at 2.0 cm Hyperdynamic LV with functional LVOT peak gradient 5 mmHg without SAM or LVOT obstruction Visual LVEF 65-70%. Left atrial cavity is moderately dilated. AP measurement 4.4 cm. 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 Mild mitral and tricuspid regurgitation. Pulmonary artery systolic pressure 93-71 mmhg. No significant change compared to prior study dated 02/16/2016  EKG:   EKG  05/02/2020: Sinus rhythm with first-degree AV block at rate of 71 bpm, left atrial enlargement, left axis deviation, left anterior fascicular block.  Poor R wave progression, cannot exclude anteroseptal infarct old.  No evidence of ischemia.  No significant change from 10/22/2019.  EKG 06/30/2020: Atypical atrial flutter with 3: 1 conduction, ventricular rate 57 bpm.  Left axis deviation, left intrafascicular block.  Borderline criteria for LVH.  Incomplete right bundle branch block.  Poor R wave progression, cannot exclude anteroseptal infarct old.  Nonspecific T abnormality.  Assessment   1.  Acute diastolic heart failure 2.  Hyponatremia due to volume excess 3.  Paroxysmal atrial fibrillation, patient now in atypical atrial flutter with controlled ventricular response. CHA2DS2-VASc Score is 4.  Yearly risk of stroke: 4.8% (A, HTN, Vasc Dz).  Score of 1=0.6; 2=2.2; 3=3.2; 4=4.8; 5=7.2; 6=9.8; 7=>9.8) -(CHF; HTN; vasc disease DM,  Male = 1; Age <65 =0; 65-74 = 1,  >75 =2; stroke/embolism= 2).   4.  Asymptomatic bilateral carotid artery stenosis, left high-grade. 5.  Primary hypertension 6.  Hyperlipidemia 7.  Acute renal failure due to cardiorenal syndrome and contrast nephropathy related to recent carotid angiography.  Medications Discontinued During This Encounter  Medication Reason  . 0.9 %  sodium chloride infusion   . amLODipine (NORVASC) 5 MG tablet Discontinued by provider  . valsartan-hydrochlorothiazide (DIOVAN-HCT) 160-12.5 MG tablet Patient Preference  . cyanocobalamin 6967 MCG tablet Duplicate    Recommendations:   Patient will need diuretic therapy to improve his sodium, has severely reduced serum osmolality with low sodium, urinary sodium and urinary osmolality was not performed unfortunately although orders were written.I suspect hypervolemic hyponatremia.  He will need to be started on diuretics.  Suspect his renal function will improve, continue to hold ARB. However physical  exam he appears euvolumic. Would probably repeat S. Osm and U. Sp gr and osm to guide diuretic therapy. Also low Na may been related to recent aldactone which is now on hold. D/W Dr. Broadus John in person  Blood pressure is relatively well controlled today, continue present medications.  On appropriate medical therapy for hyperlipidemia as well.  With regard to atypical atrial flutter, he is on appropriately anticoagulated and we will set him up for direct-current cardioversion today. Schedule for Direct current cardioversion. I have discussed regarding risks benefits rate control vs rhythm control with the patient. Patient understands cardiac arrest and need for CPR, aspiration pneumonia, but not limited to these. Patient is willing.     Adrian Prows, MD, [facility] 07/01/2020, 6:32 AM Office: [phone]

## 2020-07-01 NOTE — CV Procedure (Signed)
Direct current cardioversion:  Indication symptomatic A. Fibrillation/atypical atrial flutter.  Procedure: Using 60 mg of IV Propofol and 20 IV Lidocaine (for reducing venous pain) for achieving deep sedation, synchronized direct current cardioversion performed. Patient was delivered with 100J x1 then 150 Joules of electricity X 1 with success to NSR. Patient tolerated the procedure well. No immediate complication noted.    Adrian Prows, MD, [facility] 07/01/2020, 12:31 PM Office: [phone]

## 2020-07-01 NOTE — Interval H&P Note (Signed)
History and Physical Interval Note:  07/01/2020 12:25 PM  Dalton Cardenas  has presented today for surgery, with the diagnosis of Atypical atrial flutter.  The various methods of treatment have been discussed with the patient and family. After consideration of risks, benefits and other options for treatment, the patient has consented to  Procedure(s): CARDIOVERSION (N/A) as a surgical intervention.  The patient's history has been reviewed, patient examined, no change in status, stable for surgery.  I have reviewed the patient's chart and labs.  Questions were answered to the patient's satisfaction.     Adrian Prows

## 2020-07-01 NOTE — Progress Notes (Signed)
PROGRESS NOTE    Dalton Cardenas  POE:[id number] DOB: 1934-03-03 DOA: 06/30/2020 PCP: Leighton Ruff, MD  Brief Narrative: 86 year old male with history of hypertension, dyslipidemia, OSA, history of AVNRT ablation, history of atrial flutter, on Eliquis, recently diagnosed with severe left carotid artery disease and severe proximal right subclavian artery stenosis. -Presented to the ED with 2 episodes of vomiting yesterday -No new neurological symptoms reported, labs noted sodium of 119   Assessment & Plan:   Hyponatremia -Suspect this is multifactorial, likely secondary to spironolactone which was started 5-6 weeks ago, nausea and vomiting yesterday which could have been part of the cause or the result of hyponatremia, 1 dose of duloxetine on Wednesday less likely -Sodium improving with hydration, hydrated for 12 hours overnight, 128 this morning -Will recheck this afternoon -Urinalysis, urine sodium and osmolarity still pending, patient was unable to urinate in the ED yesterday evening for this -Only took 1 dose of Cymbalta on Wednesday which has also been discontinued  Nausea and vomiting -Resolved, could have been related to hyponatremia -Also took duloxetine for the first time Wednesday afternoon and doxylamine -Tolerating p.o. since yesterday  Atrial fibrillation -History of a flutter, AVNRT, prior ablation -On Eliquis -Back in A. fib now, controlled, cardiology Dr. Einar Gip following who has recommended cardioversion today  80% left carotid artery stenosis Severe right proximal subclavian artery stenosis, concern for subclavian steal -Discussed with cardiology Dr. Einar Gip, who referred the patient last week to Dr. Oneida Alar for an urgent evaluation -If patient becomes symptomatic, will ask [facility]VS to evaluate inpatient  Mild AKI -Hold diuretic and valsartan today -BMP this afternoon and in a.m.  DVT prophylaxis: Eliquis Code Status: Full code Family Communication: No family at  bedside, discussed with patient in detail Disposition Plan:  Status is: Inpatient  Remains inpatient appropriate because:Inpatient level of care appropriate due to severity of illness   Dispo: The patient is from: Home              Anticipated d/c is to: Home              Anticipated d/c date is: 1 day              Patient currently is not medically stable to d/c.    Consultants:   Dr. Einar Gip   Procedures:   Antimicrobials:    Subjective: -Feels okay today, anxious about ablation  Objective: Vitals:   06/30/20 1539 06/30/20 2212 07/01/20 0554 07/01/20 0820  BP: (!) 155/73  109/71 (!) 156/67  Pulse: (!) 58 61 (!) 47 62  Resp: 18 17 17 18   Temp: 97.9 F (36.6 C)  98.4 F (36.9 C) 99 F (37.2 C)  TempSrc: Oral   Oral  SpO2: 99% 98% 94% 97%  Weight: 74 kg     Height: 5\' 7"  (1.702 m)       Intake/Output Summary (Last 24 hours) at 07/01/2020 1120 Last data filed at 07/01/2020 3154 Gross per 24 hour  Intake 1168.1 ml  Output 800 ml  Net 368.1 ml   Filed Weights   06/30/20 0525 06/30/20 1539  Weight: 73.9 kg 74 kg    Examination:  General exam: Elderly male laying comfortably in bed, AAOx3, no distress HEENT: Left carotid bruit noted Respiratory system: Clear  cardiovascular system: S1 & S2 heard, irregularly irregular gastrointestinal system: Abdomen is nondistended, soft and nontender.Normal bowel sounds heard. Central nervous system: Alert and oriented, moves all extremities no localizing signs, no facial asymmetry Extremities: No edema Skin:  No rashes on exposed skin Psychiatry: Judgement and insight appear normal. Mood & affect appropriate.     Data Reviewed:   CBC: Recent Labs  Lab 06/30/20 0532 07/01/20 0209  WBC 9.7 9.0  HGB 12.9* 12.2*  HCT 37.5* 34.9*  MCV 86.2 86.4  PLT 321 798   Basic Metabolic Panel: Recent Labs  Lab 06/30/20 0532 06/30/20 1406 06/30/20 1600 06/30/20 2101 07/01/20 0209  NA 119* 125* 126* 127* 128*  K 4.1  4.5 4.5 4.5 4.9  CL 90* 96* 97* 99 100  CO2 17* 20* 21* 21* 21*  GLUCOSE 154* 120* 118* 131* 115*  BUN 27* 21 19 21 22   CREATININE 1.43* 1.15 1.11 1.17 1.35*  CALCIUM 8.6* 8.5* 8.5* 8.5* 8.4*  MG  --  2.1  --   --   --   PHOS  --  3.0  --   --   --    GFR: Estimated Creatinine Clearance: 38.1 mL/min (A) (by C-G formula based on SCr of 1.35 mg/dL (H)). Liver Function Tests: Recent Labs  Lab 07/01/20 0209  AST 27  ALT 24  ALKPHOS 43  BILITOT 1.0  PROT 5.6*  ALBUMIN 2.8*   Recent Labs  Lab 06/30/20 1406  LIPASE 39   No results for input(s): AMMONIA in the last 168 hours. Coagulation Profile: No results for input(s): INR, PROTIME in the last 168 hours. Cardiac Enzymes: No results for input(s): CKTOTAL, CKMB, CKMBINDEX, TROPONINI in the last 168 hours. BNP (last 3 results) No results for input(s): PROBNP in the last 8760 hours. HbA1C: No results for input(s): HGBA1C in the last 72 hours. CBG: No results for input(s): GLUCAP in the last 168 hours. Lipid Profile: No results for input(s): CHOL, HDL, LDLCALC, TRIG, CHOLHDL, LDLDIRECT in the last 72 hours. Thyroid Function Tests: Recent Labs    06/30/20 1406  TSH 1.774   Anemia Panel: No results for input(s): VITAMINB12, FOLATE, FERRITIN, TIBC, IRON, RETICCTPCT in the last 72 hours. Urine analysis:    Component Value Date/Time   COLORURINE YELLOW 07/22/2012 1218   APPEARANCEUR CLEAR 07/22/2012 1218   LABSPEC 1.007 07/22/2012 1218   PHURINE 7.0 07/22/2012 [place] 07/22/2012 [place] 07/22/2012 [place] 07/22/2012 [place] 07/22/2012 1218   PROTEINUR NEGATIVE 07/22/2012 1218   UROBILINOGEN 0.2 07/22/2012 1218   NITRITE NEGATIVE 07/22/2012 1218   LEUKOCYTESUR NEGATIVE 07/22/2012 1218   Sepsis Labs: @LABRCNTIP (procalcitonin:4,lacticidven:4)  ) Recent Results (from the past 240 hour(s))  Respiratory Panel by RT PCR (Flu A&B, Covid) -  Nasopharyngeal Swab     Status: None   Collection Time: 06/30/20  9:32 AM   Specimen: Nasopharyngeal Swab  Result Value Ref Range Status   SARS Coronavirus 2 by RT PCR NEGATIVE NEGATIVE Final    Comment: (NOTE) SARS-CoV-2 target nucleic acids are NOT DETECTED.  The SARS-CoV-2 RNA is generally detectable in upper respiratoy specimens during the acute phase of infection. The lowest concentration of SARS-CoV-2 viral copies this assay can detect is 131 copies/mL. A negative result does not preclude SARS-Cov-2 infection and should not be used as the sole basis for treatment or other patient management decisions. A negative result may occur with  improper specimen collection/handling, submission of specimen other than nasopharyngeal swab, presence of viral mutation(s) within the areas targeted by this assay, and inadequate number of viral copies (<131 copies/mL). A negative result must be combined with clinical observations, patient history, and epidemiological information.  The expected result is Negative.  Fact Sheet for Patients:  PinkCheek.be  Fact Sheet for Healthcare Providers:  GravelBags.it  This test is no t yet approved or cleared by the [place] FDA and  has been authorized for detection and/or diagnosis of SARS-CoV-2 by FDA under an Emergency Use Authorization (EUA). This EUA will remain  in effect (meaning this test can be used) for the duration of the COVID-19 declaration under Section 564(b)(1) of the Act, 21 U.S.C. section 360bbb-3(b)(1), unless the authorization is terminated or revoked sooner.     Influenza A by PCR NEGATIVE NEGATIVE Final   Influenza B by PCR NEGATIVE NEGATIVE Final    Comment: (NOTE) The Xpert Xpress SARS-CoV-2/FLU/RSV assay is intended as an aid in  the diagnosis of influenza from Nasopharyngeal swab specimens and  should not be used as a sole basis for treatment. Nasal washings and    aspirates are unacceptable for Xpert Xpress SARS-CoV-2/FLU/RSV  testing.  Fact Sheet for Patients: PinkCheek.be  Fact Sheet for Healthcare Providers: GravelBags.it  This test is not yet approved or cleared by the [place] FDA and  has been authorized for detection and/or diagnosis of SARS-CoV-2 by  FDA under an Emergency Use Authorization (EUA). This EUA will remain  in effect (meaning this test can be used) for the duration of the  Covid-19 declaration under Section 564(b)(1) of the Act, 21  U.S.C. section 360bbb-3(b)(1), unless the authorization is  terminated or revoked. Performed at [facility], [place] [address], [place], [place] [address]          Radiology Studies: DG Chest 2 View  Result Date: 06/30/2020 CLINICAL DATA:  Abdominal pain, vomiting, shortness of breath EXAM: CHEST - 2 VIEW COMPARISON:  11/26/2019 FINDINGS: Lungs are clear.  No pleural effusion or pneumothorax. The heart is normal in size.  Thoracic aortic atherosclerosis. Degenerative changes of the visualized thoracolumbar spine. IMPRESSION: Normal chest radiographs. Electronically Signed   By: Julian Hy M.D.   On: 06/30/2020 06:14        Scheduled Meds: . [MAR Hold] apixaban  5 mg Oral BID  . [MAR Hold] folic acid  1 mg Oral Daily  . [MAR Hold] rosuvastatin  10 mg Oral Daily   Continuous Infusions:   LOS: 1 day    Time spent: 71min  Domenic Polite, MD [facility]  07/01/2020, 11:20 AM

## 2020-07-01 NOTE — Anesthesia Preprocedure Evaluation (Addendum)
Anesthesia Evaluation  Patient identified by MRN, date of birth, ID band Patient awake    Reviewed: Allergy & Precautions, NPO status , Patient's Chart, lab work & pertinent test results  Airway Mallampati: II  TM Distance: >3 FB Neck ROM: Full    Dental  (+) Teeth Intact, Dental Advisory Given   Pulmonary sleep apnea ,    breath sounds clear to auscultation       Cardiovascular hypertension, + CAD and + Peripheral Vascular Disease  + dysrhythmias Atrial Fibrillation  Rhythm:Irregular Rate:Tachycardia     Neuro/Psych negative neurological ROS  negative psych ROS   GI/Hepatic negative GI ROS, Neg liver ROS,   Endo/Other  negative endocrine ROS  Renal/GU Renal InsufficiencyRenal disease     Musculoskeletal negative musculoskeletal ROS (+)   Abdominal Normal abdominal exam  (+)   Peds  Hematology negative hematology ROS (+)   Anesthesia Other Findings   Reproductive/Obstetrics                            Anesthesia Physical Anesthesia Plan  ASA: III  Anesthesia Plan: General   Post-op Pain Management:    Induction: Intravenous  PONV Risk Score and Plan: 0 and Propofol infusion  Airway Management Planned: Natural Airway and Simple Face Mask  Additional Equipment: None  Intra-op Plan:   Post-operative Plan:   Informed Consent: I have reviewed the patients History and Physical, chart, labs and discussed the procedure including the risks, benefits and alternatives for the proposed anesthesia with the patient or authorized representative who has indicated his/her understanding and acceptance.       Plan Discussed with: CRNA  Anesthesia Plan Comments: (Echo:  Left ventricle cavity is normal in size. Moderate asymmetric hypertrophy  of the left ventricle. Basal septum measures at 2.0 cm. Mild LVOT peak  gradient 5 mmHg without SAM or LVOT obstruction.  Normal wall motion. LVEF  55-60%. Indeterminate diastolic dysfunction.  Left atrial cavity is mildly dilated.  Trileaflet aortic valve. Trace aortic stenosis. Moderate (Grade II) aortic  regurgitation.  Mild calcification, No significant stenosis. Moderate (Grade II) mitral  regurgitation.  Mild tricuspid regurgitation. Estimated pulmonary artery systolic pressure  30 mmHg.  No significant change compared to previous study in 2018.)       Anesthesia Quick Evaluation

## 2020-07-01 NOTE — Transfer of Care (Signed)
Immediate Anesthesia Transfer of Care Note  Patient: Dalton Cardenas  Procedure(s) Performed: CARDIOVERSION (N/A )  Patient Location: Endoscopy Unit  Anesthesia Type:General  Level of Consciousness: drowsy  Airway & Oxygen Therapy: Patient Spontanous Breathing  Post-op Assessment: Report given to RN and Post -op Vital signs reviewed and stable  Post vital signs: Reviewed and stable  Last Vitals:  Vitals Value Taken Time  BP    Temp    Pulse 76 07/01/20 1233  Resp 18 07/01/20 1233  SpO2 98 % 07/01/20 1233  Vitals shown include unvalidated device data.  Last Pain:  Vitals:   07/01/20 1121  TempSrc: Oral  PainSc: 0-No pain         Complications: No complications documented.

## 2020-07-02 DIAGNOSIS — E871 Hypo-osmolality and hyponatremia: Secondary | ICD-10-CM | POA: Diagnosis not present

## 2020-07-02 LAB — BASIC METABOLIC PANEL
Anion gap: 9 (ref 5–15)
Anion gap: 7 (ref 5–15)
BUN: 15 mg/dL (ref 8–23)
BUN: 13 mg/dL (ref 8–23)
CO2: 21 mmol/L — ABNORMAL LOW (ref 22–32)
CO2: 24 mmol/L (ref 22–32)
Calcium: 8.8 mg/dL — ABNORMAL LOW (ref 8.9–10.3)
Calcium: 9.2 mg/dL (ref 8.9–10.3)
Chloride: 101 mmol/L (ref 98–111)
Chloride: 99 mmol/L (ref 98–111)
Creatinine, Ser: 1.06 mg/dL (ref 0.61–1.24)
Creatinine, Ser: 1.08 mg/dL (ref 0.61–1.24)
GFR, Estimated: >60 mL/min (ref >60)
GFR, Estimated: >60 mL/min (ref >60)
Glucose, Bld: 116 mg/dL — ABNORMAL HIGH (ref 70–99)
Glucose, Bld: 121 mg/dL — ABNORMAL HIGH (ref 70–99)
Potassium: 5.2 mmol/L — ABNORMAL HIGH (ref 3.5–5.1)
Potassium: 4.4 mmol/L (ref 3.5–5.1)
Sodium: 131 mmol/L — ABNORMAL LOW (ref 135–145)
Sodium: 130 mmol/L — ABNORMAL LOW (ref 135–145)

## 2020-07-02 LAB — BRAIN NATRIURETIC PEPTIDE: B Natriuretic Peptide: 127.9 pg/mL — ABNORMAL HIGH (ref 0.0–100.0)

## 2020-07-02 LAB — CBC
HCT: 36.9 % — ABNORMAL LOW (ref 39.0–52.0)
Hemoglobin: 12.7 g/dL — ABNORMAL LOW (ref 13.0–17.0)
MCH: 30.4 pg (ref 26.0–34.0)
MCHC: 34.4 g/dL (ref 30.0–36.0)
MCV: 88.3 fL (ref 80.0–100.0)
Platelets: 273 10*3/uL (ref 150–400)
RBC: 4.18 MIL/uL — ABNORMAL LOW (ref 4.22–5.81)
RDW: 12.7 % (ref 11.5–15.5)
WBC: 9.2 10*3/uL (ref 4.0–10.5)
nRBC: 0 % (ref 0.0–0.2)

## 2020-07-02 MED ORDER — ALPRAZOLAM 0.25 MG PO TABS: 0.2500 mg | ORAL_TABLET | Freq: Every evening | ORAL | 0 refills | Status: DC | PRN

## 2020-07-02 MED ORDER — HYDRALAZINE HCL 25 MG PO TABS
25.0000 mg | ORAL_TABLET | Freq: Three times a day (TID) | ORAL | Status: DC
  Administered 2020-07-02 (×2): 25 mg via ORAL
  Filled 2020-07-02 (×2): qty 1

## 2020-07-02 MED ORDER — HYDRALAZINE HCL 25 MG PO TABS: 25.0000 mg | ORAL_TABLET | Freq: Three times a day (TID) | ORAL | Status: DC

## 2020-07-02 NOTE — Progress Notes (Signed)
DISCHARGE NOTE HOME  Dalton Cardenas to be discharged Home per MD order. Discussed prescriptions and follow up appointments with the patient. Prescriptions given to patient; medication list explained in detail. Patient verbalized understanding.  Skin clean, dry and intact without evidence of skin break down, no evidence of skin tears noted. IV catheter discontinued intact. Site without signs and symptoms of complications. Dressing and pressure applied. Pt denies pain at the site currently. No complaints noted.  Patient free of lines, drains, and wounds.   An After Visit Summary (AVS) was printed and given to the patient. Patient escorted via wheelchair, and discharged home via private auto.  Beatris Ship, RN

## 2020-07-02 NOTE — Discharge Summary (Signed)
Physician Discharge Summary  Dalton Cardenas:[id number] DOB: 01-Apr-1934 DOA: 06/30/2020  PCP: Leighton Ruff, MD  Admit date: 06/30/2020 Discharge date: 07/02/2020  Time spent: 35 minutes  Recommendations for Outpatient Follow-up:  1. Cardiology Dr. Einar Gip in 1 week, please monitor blood pressure volume status closely, discharged off diuretics 2. Vascular surgery Dr. Oneida Alar in 1 to 2 weeks   Discharge Diagnoses:  Principal Problem:   Hyponatremia Active Problems:   HYPERCHOLESTEROLEMIA   Obstructive sleep apnea   Essential hypertension, benign   Coronary artery disease   AKI (acute kidney injury) (HCC)   Atrial flutter (HCC) Asymptomatic left carotid artery stenosis Right proximal subclavian artery stenosis  Discharge Condition: Stable  Diet recommendation: Low-sodium, heart healthy  Filed Weights   06/30/20 0525 06/30/20 1539 07/01/20 1121  Weight: 73.9 kg 74 kg 71.7 kg    History of present illness:  86 year old male with history of hypertension, dyslipidemia, OSA, history of AVNRT ablation, history of atrial flutter, on Eliquis, recently diagnosed with severe left carotid artery disease and severe proximal right subclavian artery stenosis. -Presented to the ED with 2 episodes of vomiting yesterday -No new neurological symptoms reported, labs noted sodium of 119  Hospital Course:    Hyponatremia -Suspect this is multifactorial, likely secondary to spironolactone which was started 5-6 weeks ago, nausea and vomiting day of admission which could have been part of the cause or the result of hyponatremia, 1 dose of duloxetine on Wednesday less likely contributing -Sodium improved with hydration, hydrated for 12 hours overnight, fluid subsequently discontinued, sodium 131 at discharge -Urine sodium and osmolarity of low yield now as they were not done on the day of admission and also patient was on diuretics at baseline -Spironolactone discontinued, duloxetine/Cymbalta  was also discontinued by patient, please check BMP in 1 week  Nausea and vomiting -Resolved, could have been related to hyponatremia -Also took duloxetine for the first time Wednesday afternoon and doxylamine Wednesday night -Resolved, no further episodes since admission  Atrial fibrillation -History of a flutter, AVNRT, prior ablation -On Eliquis -Was in atrial fibrillation with a controlled rate yesterday, followed by Dr. 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 Einar Gip, who referred the patient last week to Dr. Oneida Alar for an urgent evaluation -remained asymptomatic, FU with VVS  Mild AKI -Held aldactone and valsartan -creatinine and hyperkalemia resolved  Hypertension -resumed metoprolol and hydralazine -stopped aldactone and valsartan due to AKI and hyponatremia this admission -asked pt to keep a log of his BP -Would caution against aggressive blood pressure reduction in the setting of 80% left carotid stenosis  Procedures:  DC Cardioversion  Consultations:  Cardiology dr.Ganji  Discharge Exam: Vitals:   07/02/20 0458 07/02/20 0917  BP: (!) 166/85 (!) 174/74  Pulse: 78 90  Resp: 14 18  Temp: 98.1 F (36.7 C) 98 F (36.7 C)  SpO2: 97% 96%    General: AAOx3, no distress Cardiovascular: S1S2/RRR Respiratory: CTAB  Discharge Instructions    Allergies as of 07/02/2020      Reactions   Amoxicillin    GI upset: Bleeding diarrhea  Other reaction(s): GI Bleed   Ibuprofen    Bloody diarrhea   Penicillins    Bloody diarrhea   Ace Inhibitors    Face swelling   Amlodipine Swelling   Atorvastatin  Weakness   Azithromycin    Stomach ache   Diltiazem Nausea Only   Upset stomach per patient    Hydrochlorothiazide Other (See Comments)   Photosensitivity      Medication List    STOP taking these medications   doxylamine (Sleep) 25 MG tablet Commonly known as: UNISOM   DULoxetine 30 MG capsule Commonly known as: Cymbalta   meclizine 12.5 MG tablet Commonly known as: ANTIVERT   spironolactone 25 MG tablet Commonly known as: ALDACTONE   valsartan 160 MG tablet Commonly known as: Diovan     TAKE these medications   ALIGN PO Take 1 capsule by mouth daily.   ALPRAZolam 0.25 MG tablet Commonly known as: Xanax Take 1 tablet (0.25 mg total) by mouth at bedtime as needed for sleep.   b complex vitamins tablet Take 1 tablet by mouth daily.   cholecalciferol 1000 units tablet Commonly known as: VITAMIN D Take 1,000 Units by mouth daily.   CO Q-10 PO Take 1 capsule by mouth daily. (200mg )   Eliquis 5 MG Tabs tablet Generic drug: apixaban Take 1 tablet by mouth twice daily What changed: how much to take   folic acid 546 MCG tablet Commonly known as: FOLVITE Take 800 mcg by mouth daily.   hydrALAZINE 25 MG tablet Commonly known as: APRESOLINE Take 1 tablet (25 mg total) by mouth 3 (three) times daily. Take one if BP >140/70 What changed: additional instructions   L-Arginine 1000 MG Tabs Take 1 tablet by mouth daily.   LUTEIN PO Take 1 tablet by mouth daily.   Magnesium 400 MG Tabs Take 400 mg by mouth daily.   metoprolol succinate 25 MG 24 hr tablet Commonly known as: TOPROL-XL Take 1 tablet (25 mg total) by mouth daily. as directed What changed: when to take this   omega-3 acid ethyl esters 1 g capsule Commonly known as: LOVAZA Take 1 g by mouth daily.   PSYLLIUM PO Take 1 tablet by mouth daily.   rosuvastatin 10 MG tablet Commonly known as: CRESTOR Take 10 mg by mouth daily.   vitamin C 500 MG tablet Commonly known as: ASCORBIC ACID Take 500 mg by mouth daily.   zinc gluconate 50 MG tablet Take 50 mg by mouth daily.      Allergies   Allergen Reactions  . Amoxicillin     GI upset: Bleeding diarrhea  Other reaction(s): GI Bleed  . Ibuprofen     Bloody diarrhea   . Penicillins     Bloody diarrhea   . Ace Inhibitors     Face swelling   . Amlodipine Swelling  . Atorvastatin     Weakness   . Azithromycin     Stomach ache   . Diltiazem Nausea Only    Upset stomach per patient  . Hydrochlorothiazide Other (See Comments)    Photosensitivity    Follow-up Information    Adrian Prows, MD. Schedule an appointment as soon as possible for a visit in 1 week(s).   Specialty: Cardiology Contact information: [place] [address] [phone]                The results of significant diagnostics from this hospitalization (including imaging, microbiology, ancillary and laboratory) are listed below for reference.    Significant Diagnostic Studies: DG Chest 2 View  Result Date: 06/30/2020 CLINICAL DATA:  Abdominal pain, vomiting, shortness of breath EXAM: CHEST - 2 VIEW COMPARISON:  11/26/2019 FINDINGS: Lungs are clear.  No pleural effusion or pneumothorax. The heart is normal in size.  Thoracic aortic atherosclerosis. Degenerative changes of the visualized thoracolumbar spine. IMPRESSION: Normal chest radiographs. Electronically Signed   By: Julian Hy M.D.   On: 06/30/2020 06:14   CT ANGIO NECK W OR WO CONTRAST  Result Date: 06/28/2020 CLINICAL DATA:  Bilateral carotid stenosis EXAM: CT ANGIOGRAPHY NECK TECHNIQUE: Multidetector CT imaging of the neck was performed using the standard protocol during bolus administration of intravenous contrast. Multiplanar CT image reconstructions and MIPs were obtained to evaluate the vascular anatomy. Carotid stenosis measurements (when applicable) are obtained utilizing NASCET criteria, using the distal internal carotid diameter as the denominator. CONTRAST:  52mL ISOVUE-370 IOPAMIDOL (ISOVUE-370) INJECTION 76% COMPARISON:  None. FINDINGS:  Skeleton: There is no bony spinal canal stenosis. No lytic or blastic lesion. Other neck: Normal pharynx, larynx and major salivary glands. No cervical lymphadenopathy. Unremarkable thyroid gland. Upper chest: No pneumothorax or pleural effusion. No nodules or masses. Aortic arch: There is no calcific atherosclerosis of the aortic arch. There is no aneurysm, dissection or hemodynamically significant stenosis of the visualized ascending aorta and aortic arch. Conventional 3 vessel aortic branching pattern. There is severe stenosis of the proximal right subclavian artery. Right carotid system: --Common carotid artery: Widely patent origin without common carotid artery dissection or aneurysm. --Internal carotid artery: No dissection, occlusion or aneurysm. There is non-calcified atherosclerotic disease at the bifurcation, extending into the internal carotid artery, resulting in less than 50% stenosis. --External carotid artery: No acute abnormality. Left carotid system: --Common carotid artery: Widely patent origin without common carotid artery dissection or aneurysm. --Internal carotid artery:At the left carotid bifurcation there is noncalcified plaque that causes a qualitatively severe stenosis that measures 80% by NASCET criteria. --External carotid artery: No acute abnormality. Vertebral arteries: Left dominant configuration. Both origins are normal. There is diminished enhancement of the right vertebral artery, worst proximally. Review of the MIP images confirms the above findings IMPRESSION: 1. 80% stenosis of the proximal left internal carotid artery secondary to noncalcified plaque. 2. 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 Aortic Atherosclerosis (ICD10-I70.0). Electronically Signed   By: Ulyses Jarred M.D.   On: 06/28/2020 20:13   PCV ECHOCARDIOGRAM COMPLETE  Result Date:  06/05/2020 Echocardiogram 06/03/2020: Left ventricle cavity is normal in size. Moderate asymmetric hypertrophy of the left ventricle. Basal septum measures at 2.0 cm. Mild LVOT peak gradient 5 mmHg without SAM or LVOT obstruction. Normal wall motion. LVEF 55-60%. Indeterminate diastolic dysfunction. Left atrial cavity is mildly dilated. Trileaflet aortic valve. Trace aortic stenosis. Moderate (Grade II) aortic regurgitation. Mild calcification, No significant stenosis. Moderate (Grade II) mitral regurgitation. Mild tricuspid regurgitation. Estimated pulmonary artery systolic pressure 30 mmHg. No significant change compared to previous study in 2018.  PCV CAROTID DUPLEX (BILATERAL)  Result Date: 06/03/2020 Carotid artery duplex  06/03/2020: Stenosis in the right internal carotid artery (16-49%). Stenosis in the left internal carotid artery (>=70%). The left PSV internal/common carotid artery ratio is consistent with a stenosis of >70%. The duplex suggest near subtotal occlusion with soft plaque at the level of the proximal left ICA. Recommend different modality imaging to confirm severity of the disease. Follow up study in six months is appropriate if clinically indicated.   Microbiology: Recent Results (from the past 240 hour(s))  Respiratory Panel by RT PCR (Flu A&B, Covid) - Nasopharyngeal Swab     Status: None   Collection  Time: 06/30/20  9:32 AM   Specimen: Nasopharyngeal Swab  Result Value Ref Range Status   SARS Coronavirus 2 by RT PCR NEGATIVE NEGATIVE Final    Comment: (NOTE) SARS-CoV-2 target nucleic acids are NOT DETECTED.  The SARS-CoV-2 RNA is generally detectable in upper respiratoy specimens during the acute phase of infection. The lowest concentration of SARS-CoV-2 viral copies this assay can detect is 131 copies/mL. A negative result does not preclude SARS-Cov-2 infection and should not be used as the sole basis for treatment or other patient management decisions. A negative  result may occur with  improper specimen collection/handling, submission of specimen other than nasopharyngeal swab, presence of viral mutation(s) within the areas targeted by this assay, and inadequate number of viral copies (<131 copies/mL). A negative result must be combined with clinical observations, patient history, and epidemiological information. The expected result is Negative.  Fact Sheet for Patients:  PinkCheek.be  Fact Sheet for Healthcare Providers:  GravelBags.it  This test is no t yet approved or cleared by the [place] FDA and  has been authorized for detection and/or diagnosis of SARS-CoV-2 by FDA under an Emergency Use Authorization (EUA). This EUA will remain  in effect (meaning this test can be used) for the duration of the COVID-19 declaration under Section 564(b)(1) of the Act, 21 U.S.C. section 360bbb-3(b)(1), unless the authorization is terminated or revoked sooner.     Influenza A by PCR NEGATIVE NEGATIVE Final   Influenza B by PCR NEGATIVE NEGATIVE Final    Comment: (NOTE) The Xpert Xpress SARS-CoV-2/FLU/RSV assay is intended as an aid in  the diagnosis of influenza from Nasopharyngeal swab specimens and  should not be used as a sole basis for treatment. Nasal washings and  aspirates are unacceptable for Xpert Xpress SARS-CoV-2/FLU/RSV  testing.  Fact Sheet for Patients: PinkCheek.be  Fact Sheet for Healthcare Providers: GravelBags.it  This test is not yet approved or cleared by the [place] FDA and  has been authorized for detection and/or diagnosis of SARS-CoV-2 by  FDA under an Emergency Use Authorization (EUA). This EUA will remain  in effect (meaning this test can be used) for the duration of the  Covid-19 declaration under Section 564(b)(1) of the Act, 21  U.S.C. section 360bbb-3(b)(1), unless the authorization is   terminated or revoked. Performed at [facility], [place] [address] [place], [place] [address]      Labs: Basic Metabolic Panel: Recent Labs  Lab 06/30/20 1406 06/30/20 1600 06/30/20 2101 07/01/20 0209 07/01/20 1744 07/02/20 0536 07/02/20 0955  NA 125*   < > 127* 128* 130* 131* 130*  K 4.5   < > 4.5 4.9 4.3 5.2* 4.4  CL 96*   < > 99 100 102 101 99  CO2 20*   < > 21* 21* 21* 21* 24  GLUCOSE 120*   < > 131* 115* 106* 116* 121*  BUN 21   < > 21 22 19 15 13   CREATININE 1.15   < > 1.17 1.35* 1.29* 1.06 1.08  CALCIUM 8.5*   < > 8.5* 8.4* 8.7* 8.8* 9.2  MG 2.1  --   --   --   --   --   --   PHOS 3.0  --   --   --   --   --   --    < > = values in this interval not displayed.   Liver Function Tests: Recent Labs  Lab 07/01/20 0209  AST 27  ALT  24  ALKPHOS 43  BILITOT 1.0  PROT 5.6*  ALBUMIN 2.8*   Recent Labs  Lab 06/30/20 1406  LIPASE 39   No results for input(s): AMMONIA in the last 168 hours. CBC: Recent Labs  Lab 06/30/20 0532 07/01/20 0209 07/02/20 0536  WBC 9.7 9.0 9.2  HGB 12.9* 12.2* 12.7*  HCT 37.5* 34.9* 36.9*  MCV 86.2 86.4 88.3  PLT 321 276 273   Cardiac Enzymes: No results for input(s): CKTOTAL, CKMB, CKMBINDEX, TROPONINI in the last 168 hours. BNP: BNP (last 3 results) Recent Labs    06/30/20 1406 07/01/20 0209 07/02/20 0536  BNP 622.0* 726.8* 127.9*    ProBNP (last 3 results) No results for input(s): PROBNP in the last 8760 hours.  CBG: No results for input(s): GLUCAP in the last 168 hours.   Signed:  Domenic Polite MD.  [facility] 07/02/2020, 1:58 PM

## 2020-07-02 NOTE — Progress Notes (Signed)
Telemetry informed RN that this patient was in [place] elevation, MD has been notified and RN will continue to monitor this patient.

## 2020-07-04 ENCOUNTER — Encounter (HOSPITAL_COMMUNITY): Payer: Self-pay | Admitting: Cardiology

## 2020-07-07 ENCOUNTER — Other Ambulatory Visit: Payer: Self-pay

## 2020-07-07 ENCOUNTER — Encounter: Payer: Self-pay | Admitting: Pulmonary Disease

## 2020-07-07 ENCOUNTER — Ambulatory Visit: Payer: Medicare Other | Admitting: Pulmonary Disease

## 2020-07-07 VITALS — BP 118/68 | HR 68 | Temp 98.1°F | Ht 67.0 in | Wt 158.2 lb

## 2020-07-07 DIAGNOSIS — E871 Hypo-osmolality and hyponatremia: Secondary | ICD-10-CM

## 2020-07-07 DIAGNOSIS — R053 Chronic cough: Secondary | ICD-10-CM

## 2020-07-07 DIAGNOSIS — G4733 Obstructive sleep apnea (adult) (pediatric): Secondary | ICD-10-CM | POA: Diagnosis not present

## 2020-07-07 NOTE — Progress Notes (Signed)
   Subjective:    Patient ID: Dalton Cardenas, male    DOB: [birth year], 86 y.o.   MRN: [id number]  HPI  86 yo never smoker follow-up of moderate OSA.  PMH- atrial fibrillation on Eliquis status post ablation and GERD   Chief Complaint  Patient presents with  . Follow-up    Pt states he was recently in the hospital 1 week ago due to low sodium. Pt states CPAP has been going okay for him and denies any real complaints.   Episode of hemoptysis 11/3019 -this has not recurred.  He underwent cardiology evaluation recently which I reviewed Einar Gip) -carotid duplex showed left carotid stenosis. CT angiogram neck confirmed 80% stenosis left ICA and severe stenosis of right subclavian artery.  He had been maintained on Aldactone, valsartan, hydralazine and beta-blocker for hypertension.  He developed hyponatremia and syncope and required hospitalization, initial sodium was 119 and improved to 130 in 48 hours. Aldactone and valsartan were stopped Blood pressure was high on one reading after discharge to 190 and increase hydralazine from 25-50. Blood pressure appears good today, he appears to be back to his usual self. Took Xanax for 4 nights due to anxiety and slept well, tried CBD last night. GERD has been acting up lately -he is not on PPI anymore and has done a good job with dietary control No problems with CPAP mask or pressure. CPAP download was reviewed which shows excellent compliance and average pressure 11 cm on auto settings 8 to 12 cm no leak and no residual events    Significant tests/ events reviewed  PSG 10/2008 reviewed >> AHI 16, predom supine corrected by CPAP 10 cm Later on, adjusted by autoCPAP to 12 cm  Review of Systems neg for any significant sore throat, dysphagia, itching, sneezing, nasal congestion or excess/ purulent secretions, fever, chills, sweats, unintended wt loss, pleuritic or exertional cp, hempoptysis, orthopnea pnd or change in chronic leg swelling. Also  denies presyncope, palpitations, heartburn, abdominal pain, nausea, vomiting, diarrhea or change in bowel or urinary habits, dysuria,hematuria, rash, arthralgias, visual complaints, headache, numbness weakness or ataxia.     Objective:   Physical Exam  Gen. Pleasant, well-nourished,elderly, in no distress , jocular ENT - no thrush, no pallor/icterus,no post nasal drip Neck: No JVD, no thyromegaly, no carotid bruits Lungs: no use of accessory muscles, no dullness to percussion, clear without rales or rhonchi  Cardiovascular: Rhythm regular, heart sounds  normal, no murmurs or gallops, no peripheral edema Musculoskeletal: No deformities, no cyanosis or clubbing        Assessment & Plan:

## 2020-07-07 NOTE — Assessment & Plan Note (Signed)
-  Related to GERD. He will take Prilosec on an as-needed basis should his cough worsen

## 2020-07-07 NOTE — Assessment & Plan Note (Signed)
Good compliance and good control of events is objectively verified on download today. Continue current auto settings 8 to 12 cm  CPAP is certainly helped improve his daytime somnolence and fatigue. CPAP supplies will be renewed for a year

## 2020-07-07 NOTE — Assessment & Plan Note (Addendum)
Recheck be met today for sodium level. Feel that this was related to diuretic and contrast-AKI has improved, valsartan was stopped He has follow-up with his cardiologist in 3 days Awaits vascular appointment with Dr. Oneida Alar for carotid stenosis

## 2020-07-07 NOTE — Patient Instructions (Signed)
Check BMET today CPAP is working well

## 2020-07-08 ENCOUNTER — Other Ambulatory Visit: Payer: Self-pay

## 2020-07-08 DIAGNOSIS — I2510 Atherosclerotic heart disease of native coronary artery without angina pectoris: Secondary | ICD-10-CM

## 2020-07-08 LAB — BASIC METABOLIC PANEL
BUN: 22 mg/dL (ref 6–23)
CO2: 27 mEq/L (ref 19–32)
Calcium: 9.6 mg/dL (ref 8.4–10.5)
Chloride: 97 mEq/L (ref 96–112)
Creatinine, Ser: 1.13 mg/dL (ref 0.40–1.50)
GFR: 64 mL/min (ref >60.00)
Glucose, Bld: 112 mg/dL — ABNORMAL HIGH (ref 70–99)
Potassium: 4.7 mEq/L (ref 3.5–5.1)
Sodium: 131 mEq/L — ABNORMAL LOW (ref 135–145)

## 2020-07-10 NOTE — Progress Notes (Signed)
Primary Physician/Referring:  Leighton Ruff, MD  Patient ID: Dalton Cardenas, male    DOB: July 19, 1934, 86 y.o.   MRN: [id number]  Chief Complaint  Patient presents with  . bilateral carotid artery stenosis  . Atrial Flutter   HPI:    Dalton Cardenas  is a 86 y.o. 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 Cristopher Peru and recommended either ablation or continued medical therapy.  Patient with recurrence of atypical atrial flutter 06/30/2020.   Of note patient has experienced side effects with several medications including amlodipine atorvastatin, diltiazem, hydrochlorothiazide, spironolactone, ACE inhibitors.   Presents for follow up after hospitalization from 06/30/2020 to 07/02/2020 for hyponatremia after starting spironolactone and atypical atrial flutter. He underwent successful direct current cardioversion 07/01/2020.  Since discharge from the hospital patient has reportedly been feeling well with the exception of mild anxiety regarding chronic medical conditions.  He has been monitoring blood pressure at home, and is markedly elevated.  He denies chest pain, palpitations, shortness of breath, dizziness, lightheadedness.  Patient is scheduled for CT of the head tomorrow for Dr. Oneida Alar and has follow-up on Thursday with him in vascular surgery for carotid artery stenosis and subclavian stenosis.  Of note he is probably presently tolerating Eliquis well without bleeding diathesis.  Past Medical History:  Diagnosis Date  . Coronary artery disease    60-70% circumflex (cath in [place] 08/2010).  . Hypercholesterolemia   . Hypertension   . Prostate cancer [facility])    status post prostatectomy  . Sleep apnea   . 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 SVT (supraventricular tachycardia) ([place])      a. Holter monitoring 2011 - AV node reentry, multifocal atrial tachycardia, and resting bradycardia. b. previously tx with Abana in [place].   Past Surgical History:  Procedure Laterality Date  . ABLATION OF DYSRHYTHMIC FOCUS  10/13/2014   SVT         by Dr Lovena Le  . ANAL FISSURE REPAIR    . CARDIOVERSION N/A 07/01/2020   Procedure: CARDIOVERSION;  Surgeon: Adrian Prows, MD;  Location: [place];  Service: Cardiovascular;  Laterality: N/A;  . ELECTROPHYSIOLOGY STUDY N/A 10/13/2014   Procedure: ELECTROPHYSIOLOGY STUDY;  Surgeon: Evans Lance, MD;  Location: [facility] CATH LAB;  Service: Cardiovascular;  Laterality: N/A;  . PROSTATECTOMY    . SUPRAVENTRICULAR TACHYCARDIA ABLATION N/A 10/13/2014   Procedure: SUPRAVENTRICULAR TACHYCARDIA ABLATION;  Surgeon: Evans Lance, MD;  Location: [facility] CATH LAB;  Service: Cardiovascular;  Laterality: N/A;   Social History   Tobacco Use  . Smoking status: Never Smoker  . Smokeless tobacco: Never Used  Substance Use Topics  . Alcohol use: No   ROS  Review of Systems  Cardiovascular: Negative for chest pain, dyspnea on exertion, leg swelling, near-syncope, orthopnea, palpitations, paroxysmal nocturnal dyspnea and syncope.  Hematologic/Lymphatic: Does not bruise/bleed easily.  Gastrointestinal: Negative for melena.  Neurological: Negative for light-headedness.   Objective  Blood pressure (!) 180/92, pulse 78, height 5\' 7"  (1.702 m), weight 157 lb (71.2 kg), SpO2 97 %.  Vitals with BMI 07/11/2020 07/07/2020 07/02/2020  Height 5\' 7"  5\' 7"  -  Weight 157 lbs 158 lbs 3 oz -  BMI 96.22 29.79 -  Systolic 892 119 417  Diastolic 92 68 74  Pulse 78 68 90  Physical Exam Neck:     Thyroid: No thyromegaly.  Cardiovascular:     Rate and Rhythm: Normal rate and regular rhythm.     Pulses: Intact distal pulses.     Heart sounds: Normal heart sounds. No murmur heard.  No gallop.      Comments: No leg edema, no JVD. Pulmonary:     Effort: Pulmonary effort  is normal.     Breath sounds: Normal breath sounds.  Abdominal:     General: Bowel sounds are normal.     Palpations: Abdomen is soft.  Musculoskeletal:     Cervical back: Neck supple.  Skin:    General: Skin is warm and dry.   Vitals reviewed. Exam unchanged/stable from previous.  Laboratory examination:   Recent Labs    06/10/20 1637 06/30/20 0532 07/01/20 1744 07/01/20 1744 07/02/20 0536 07/02/20 0955 07/07/20 1026  NA 133*   < > 130*   < > 131* 130* 131*  K 5.1   < > 4.3   < > 5.2* 4.4 4.7  CL 99   < > 102   < > 101 99 97  CO2 23   < > 21*   < > 21* 24 27  GLUCOSE 87   < > 106*   < > 116* 121* 112*  BUN 18   < > 19   < > 15 13 22   CREATININE 1.07   < > 1.29*   < > 1.06 1.08 1.13  CALCIUM 9.5   < > 8.7*   < > 8.8* 9.2 9.6  GFRNONAA 63   < > 51*  --  >60 >60  --   GFRAA 73  --   --   --   --   --   --    < > = values in this interval not displayed.   estimated creatinine clearance is 45.5 mL/min (by C-G formula based on SCr of 1.13 mg/dL).  CMP Latest Ref Rng & Units 07/07/2020 07/02/2020 07/02/2020  Glucose 70 - 99 mg/dL 112(H) 121(H) 116(H)  BUN 6 - 23 mg/dL 22 13 15   Creatinine 0.40 - 1.50 mg/dL 1.13 1.08 1.06  Sodium 135 - 145 mEq/L 131(L) 130(L) 131(L)  Potassium 3.5 - 5.1 mEq/L 4.7 4.4 5.2(H)  Chloride 96 - 112 mEq/L 97 99 101  CO2 19 - 32 mEq/L 27 24 21(L)  Calcium 8.4 - 10.5 mg/dL 9.6 9.2 8.8(L)  Total Protein 6.5 - 8.1 g/dL - - -  Total Bilirubin 0.3 - 1.2 mg/dL - - -  Alkaline Phos 38 - 126 U/L - - -  AST 15 - 41 U/L - - -  ALT 0 - 44 U/L - - -   CBC Latest Ref Rng & Units 07/02/2020 07/01/2020 06/30/2020  WBC 4.0 - 10.5 K/uL 9.2 9.0 9.7  Hemoglobin 13.0 - 17.0 g/dL 12.7(L) 12.2(L) 12.9(L)  Hematocrit 39 - 52 % 36.9(L) 34.9(L) 37.5(L)  Platelets 150 - 400 K/uL 273 276 321   Lipid Panel  No results found for: CHOL, TRIG, HDL, CHOLHDL, VLDL, LDLCALC, LDLDIRECT HEMOGLOBIN A1C No results found for: HGBA1C, MPG TSH Recent Labs    06/30/20 1406    TSH 1.774    External labs 07/06/2019:   Cholesterol, total 189.000 01/25/2020 HDL 46.000 01/25/2020 LDL-C 123.000 01/25/2020 Triglycerides 108.000 01/25/2020  A1C 6.300 01/25/2020 TSH 4.640 01/25/2020   Hemoglobin 12.800 01/25/2020  Creatinine, Serum 1.150 01/25/2020 Potassium 4.100 01/25/2020 Magnesium N/D ALT (SGPT) 20.000 01/25/2020   Cholesterol, total  281.000 07/06/2019 HDL 42.000 07/06/2019 LDL 206.000 07/06/2019 Triglycerides 168.000 07/06/2019 A1C 5.800 07/06/2019 Hemoglobin 12.800 07/06/2019 Creatinine, Serum 1.140 07/06/2019 Potassium 4.200 07/06/2019 Magnesium N/D ALT (SGPT) 12.000 07/06/2019  Medications and allergies   Allergies  Allergen Reactions  . Amoxicillin     GI upset: Bleeding diarrhea  Other reaction(s): GI Bleed  . Ibuprofen     Bloody diarrhea   . Penicillins     Bloody diarrhea   . Ace Inhibitors     Face swelling   . Amlodipine Swelling  . Spironolactone     Hyponatremia   . Atorvastatin     Weakness   . Azithromycin     Stomach ache   . Diltiazem Nausea Only    Upset stomach per patient  . Hydrochlorothiazide Other (See Comments)    Photosensitivity    Current Outpatient Medications on File Prior to Visit  Medication Sig Dispense Refill  . b complex vitamins tablet Take 1 tablet by mouth daily.    . cholecalciferol (VITAMIN D) 1000 UNITS tablet Take 1,000 Units by mouth daily.    . Coenzyme Q10 (CO Q-10 PO) Take 1 capsule by mouth daily. (200mg )    . ELIQUIS 5 MG TABS tablet Take 1 tablet by mouth twice daily (Patient taking differently: Take 5 mg by mouth 2 (two) times daily. ) 124 tablet 0  . folic acid (FOLVITE) 580 MCG tablet Take 800 mcg by mouth daily.      . hydrALAZINE (APRESOLINE) 25 MG tablet Take 1 tablet (25 mg total) by mouth 3 (three) times daily. Take one if BP >140/70 (Patient taking differently: Take 50 mg by mouth 3 (three) times daily. Take one if BP >140/70)    . L-Arginine 1000 MG TABS Take 1 tablet by  mouth daily.     . LUTEIN PO Take 1 tablet by mouth daily.    . Magnesium 400 MG TABS Take 400 mg by mouth daily.     Marland Kitchen omega-3 acid ethyl esters (LOVAZA) 1 g capsule Take 1 g by mouth daily.    . Probiotic Product (ALIGN PO) Take 1 capsule by mouth daily.     . PSYLLIUM PO Take 1 tablet by mouth daily.    . rosuvastatin (CRESTOR) 10 MG tablet Take 10 mg by mouth daily.      . vitamin C (ASCORBIC ACID) 500 MG tablet Take 500 mg by mouth daily.    Marland Kitchen zinc gluconate 50 MG tablet Take 50 mg by mouth daily.      No current facility-administered medications on file prior to visit.    Radiology:  No results found.   Chest x-ray 06/30/2020: Lungs are clear.  No pleural effusion or pneumothorax. The heart is normal in size.  Thoracic aortic atherosclerosis. Degenerative changes of the visualized thoracolumbar spine.   Cardiac Studies:  Coronary Angiogram  [2011]: Performed in [place], 70% stenosis in the circumflex coronary artery. Was performed as a part of workup for EP study for the SVT.  Nuclear stress test  [04/26/2010]: Myocardial perfusion scan 04/26/2010: Normal perfusion, EF 77%.  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  Echocardiogram 06/03/2020: Left ventricle cavity is normal in size. Moderate asymmetric hypertrophy of the left ventricle. Basal septum measures at 2.0 cm. Mild LVOT peak gradient 5 mmHg without SAM or LVOT obstruction. Normal wall motion. LVEF 55-60%. Indeterminate diastolic dysfunction. Left  atrial cavity is mildly dilated. Trileaflet aortic valve. Trace aortic stenosis. Moderate (Grade II) aortic regurgitation. Mild calcification, No significant stenosis. Moderate (Grade II) mitral regurgitation. Mild tricuspid regurgitation. Estimated pulmonary artery systolic pressure 30 mmHg.  No significant change compared to previous study  in 2018.  Carotid artery duplex  06/03/2020: Stenosis in the right internal carotid artery (16-49%). Stenosis in the left internal carotid artery (>=70%). The left PSV internal/common carotid artery ratio is consistent with a stenosis of >70%. The duplex suggest near subtotal occlusion with soft plaque at the level of the proximal left ICA.  Recommend different modality imaging to confirm severity of the disease.  Follow up study in six months is appropriate if clinically indicated.  CT angio neck 06/28/2020: 1.  Noncalcified atherosclerotic disease in the bifurcation of the right ICA <50% stenosis. 2. 80% stenosis of the proximal left internal carotid artery secondary to noncalcified plaque. 3. 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 4. Aortic Atherosclerosis (ICD10-I70.0).  EKG:    EKG 07/11/2020: Sinus rhythm at a rate of 70 bpm with first-degree AV block, left atrial enlargement.  Left axis deviation, left anterior fascicular block.  Poor R wave progression, cannot exclude anteroseptal infarct old.  Compared to EKG/16/2021, no significant change.  Assessment     ICD-10-CM   1. Essential hypertension, benign  I10 acebutolol (SECTRAL) 200 MG capsule    valsartan (DIOVAN) 160 MG tablet    Basic metabolic panel    Basic metabolic panel  2. S/P ablation of atrial flutter  Z98.890 EKG 12-Lead   Z86.79 acebutolol (SECTRAL) 200 MG capsule  3. Hyponatremia  L38.1 Basic metabolic panel    Basic metabolic panel  4. Generalized anxiety disorder  F41.1 ALPRAZolam (XANAX) 0.25 MG tablet    CHA2DS2-VASc Score is 4.  Yearly risk of stroke: 4.8% (A, HTN, Vasc Dz).  Score of 1=0.6; 2=2.2; 3=3.2; 4=4.8; 5=7.2; 6=9.8; 7=>9.8) -(CHF; HTN; vasc disease DM,  Male = 1; Age <65 =0; 65-74 = 1,  >75 =2; stroke/embolism= 2).    Meds ordered this encounter  Medications    . acebutolol (SECTRAL) 200 MG capsule    Sig: Take 1 capsule (200 mg total) by mouth 2 (two) times daily.    Dispense:  60 capsule    Refill:  3  . valsartan (DIOVAN) 160 MG tablet    Sig: Take 1 tablet (160 mg total) by mouth daily.    Dispense:  90 tablet    Refill:  3  . ALPRAZolam (XANAX) 0.25 MG tablet    Sig: Take 1 tablet (0.25 mg total) by mouth at bedtime as needed for sleep.    Dispense:  10 tablet    Refill:  0   Medications Discontinued During This Encounter  Medication Reason  . metoprolol succinate (TOPROL-XL) 25 MG 24 hr tablet Change in therapy  . ALPRAZolam (XANAX) 0.25 MG tablet Reorder    Recommendations:   JEMARIO POITRAS  is a 86 y.o. 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 Cristopher Peru and recommended either ablation or continued medical therapy.  Patient with recurrence of atypical atrial flutter 06/30/2020.   Of note patient has experienced side effects with several medications including amlodipine atorvastatin, diltiazem, hydrochlorothiazide, spironolactone, ACE inhibitors.   Patient presents for hospital follow-up after discharge  on 07/02/2020. I have personally reviewed notes, imaging results, and labs from hospital admission as well as other follow up appointments. He had been hospitalized for hyponatremia after starting spironolactone.  Spironolactone has been stopped, will add this to his allergy list due to severe hyponatremia.  Reviewed labs, sodium level continues to be low at 131.  During his hospital stay valsartan was also discontinued.  Patient's blood pressures have been markedly elevated since hospitalization.  He had previously been tolerating valsartan well, will restart him on valsartan 160 mg daily.  We will repeat BMP in 10 to 14 days.  Of note patient is receiving CT of the head with contrast tomorrow, recommend  holding valsartan until 2 days after this procedure in view of current acute kidney injury.  In view of patient's paroxysmal atrial flutter and continually elevated blood pressure, will stop metoprolol succinate and start patient on acebutolol 200 mg twice daily.  He is presently in sinus rhythm.  He is tolerating Eliquis well without bleeding diathesis, will continue this.  Patient continues to report increased anxiety especially in regard to ongoing medical conditions and evaluation.  Will refill Xanax 0.25mg  for once daily use as needed.  Patient is currently undergoing evaluation by Dr. Oneida Alar of vascular surgery for carotid artery stenosis and subclavian stenosis.  We will follow-up for his recommendations.  Follow-up in 6 weeks.  Patient was seen in collaboration with Dr. Einar Gip. He also reviewed patient's chart and examined the patient. Dr. Einar Gip is in agreement of the plan.    Alethia Berthold, PA-C 07/11/2020, 4:57 PM Office: [phone]

## 2020-07-11 ENCOUNTER — Ambulatory Visit: Payer: Medicare Other | Admitting: Student

## 2020-07-11 ENCOUNTER — Other Ambulatory Visit: Payer: Self-pay

## 2020-07-11 ENCOUNTER — Encounter: Payer: Self-pay | Admitting: Student

## 2020-07-11 VITALS — BP 180/92 | HR 78 | Ht 67.0 in | Wt 157.0 lb

## 2020-07-11 DIAGNOSIS — F411 Generalized anxiety disorder: Secondary | ICD-10-CM

## 2020-07-11 DIAGNOSIS — Z8679 Personal history of other diseases of the circulatory system: Secondary | ICD-10-CM

## 2020-07-11 DIAGNOSIS — I10 Essential (primary) hypertension: Secondary | ICD-10-CM

## 2020-07-11 DIAGNOSIS — E871 Hypo-osmolality and hyponatremia: Secondary | ICD-10-CM

## 2020-07-11 MED ORDER — ALPRAZOLAM 0.25 MG PO TABS: 0.2500 mg | ORAL_TABLET | Freq: Every evening | ORAL | 0 refills | Status: DC | PRN

## 2020-07-11 MED ORDER — ACEBUTOLOL HCL 200 MG PO CAPS: 200.0000 mg | ORAL_CAPSULE | Freq: Two times a day (BID) | ORAL | 3 refills | Status: DC

## 2020-07-11 MED ORDER — VALSARTAN 160 MG PO TABS: 160.0000 mg | ORAL_TABLET | Freq: Every day | ORAL | 3 refills | Status: DC

## 2020-07-11 NOTE — Patient Instructions (Addendum)
Start valsartan 2 days after CT.  Blood work at [facility] in 10-14 days.  Stop metoprolol and start acebutolol.

## 2020-07-12 ENCOUNTER — Other Ambulatory Visit: Payer: Self-pay | Admitting: Cardiology

## 2020-07-12 ENCOUNTER — Ambulatory Visit (HOSPITAL_COMMUNITY)
Admission: RE | Admit: 2020-07-12 | Discharge: 2020-07-12 | Disposition: A | Discharge status: Home / Self Care | Payer: Medicare Other | Source: Ambulatory Visit | Attending: Vascular Surgery | Admitting: Vascular Surgery

## 2020-07-12 ENCOUNTER — Telehealth: Payer: Self-pay

## 2020-07-12 DIAGNOSIS — I2510 Atherosclerotic heart disease of native coronary artery without angina pectoris: Secondary | ICD-10-CM | POA: Insufficient documentation

## 2020-07-12 DIAGNOSIS — F411 Generalized anxiety disorder: Secondary | ICD-10-CM

## 2020-07-12 MED ORDER — ALPRAZOLAM 0.25 MG PO TABS: 0.2500 mg | ORAL_TABLET | Freq: Every evening | ORAL | 0 refills | Status: DC | PRN

## 2020-07-12 MED ORDER — IOHEXOL 350 MG/ML SOLN
50.0000 mL | Freq: Once | INTRAVENOUS | Status: AC | PRN
  Administered 2020-07-12: 50 mL via INTRAVENOUS

## 2020-07-12 NOTE — Telephone Encounter (Signed)
VM [id number] : Patient called and left a message asking where we faxed his Duanne Moron Rx? I call the pharmacy on file and they said they do not have that Rx there. She said we sent it to another [facility]. I tried to call the patient, NA, LMAM.  Please advise.

## 2020-07-12 NOTE — Telephone Encounter (Signed)
I called patient and he is going to go to [place] where script was sent tomorrow to pick up the Xanax. He will let us know if he is unable to obtain.

## 2020-07-14 ENCOUNTER — Other Ambulatory Visit: Payer: Self-pay

## 2020-07-14 ENCOUNTER — Encounter: Payer: Self-pay | Admitting: Vascular Surgery

## 2020-07-14 ENCOUNTER — Ambulatory Visit: Payer: Medicare Other | Admitting: Vascular Surgery

## 2020-07-14 VITALS — BP 178/97 | HR 81 | Temp 98.0°F | Resp 20 | Ht 67.0 in | Wt 158.0 lb

## 2020-07-14 DIAGNOSIS — I6522 Occlusion and stenosis of left carotid artery: Secondary | ICD-10-CM | POA: Diagnosis not present

## 2020-07-14 MED ORDER — ASPIRIN EC 81 MG PO TBEC: 81.0000 mg | DELAYED_RELEASE_TABLET | Freq: Every day | ORAL | 1 refills | Status: DC

## 2020-07-14 MED ORDER — CLOPIDOGREL BISULFATE 75 MG PO TABS: 75.0000 mg | ORAL_TABLET | Freq: Every day | ORAL | 1 refills | Status: DC

## 2020-07-14 NOTE — H&P (View-Only) (Signed)
Referring Physician: Dr Nadyne Coombes  Patient name: LOCKE BARRELL MRN: [id number] DOB: 1934-06-15 Sex: male  REASON FOR CONSULT: Greater than 80% left internal carotid artery stenosis  HPI: Dalton Cardenas is a 86 y.o. male, noted to have greater than 80% left internal carotid artery stenosis after follow-up for an asymptomatic bruit.  Patient has no prior symptoms of TIA or stroke.  He is currently on Eliquis for cardiac arrhythmia.  He has never had an embolic event.  Other medical problems include hypertension hypercholesterolemia coronary artery disease all of which have been stable.  Past Medical History:  Diagnosis Date  . Coronary artery disease    60-70% circumflex (cath in [place] 08/2010).  . Hypercholesterolemia   . Hypertension   . Prostate cancer [facility])    status post prostatectomy  . Sleep apnea   . 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 SVT (supraventricular tachycardia) ([place])    a. Holter monitoring 2011 - AV node reentry, multifocal atrial tachycardia, and resting bradycardia. b. previously tx with Abana in [place].   Past Surgical History:  Procedure Laterality Date  . ABLATION OF DYSRHYTHMIC FOCUS  10/13/2014   SVT         by Dr Lovena Le  . ANAL FISSURE REPAIR    . CARDIOVERSION N/A 07/01/2020   Procedure: CARDIOVERSION;  Surgeon: Adrian Prows, MD;  Location: [place];  Service: Cardiovascular;  Laterality: N/A;  . ELECTROPHYSIOLOGY STUDY N/A 10/13/2014   Procedure: ELECTROPHYSIOLOGY STUDY;  Surgeon: Evans Lance, MD;  Location: [facility] CATH LAB;  Service: Cardiovascular;  Laterality: N/A;  . PROSTATECTOMY    . SUPRAVENTRICULAR TACHYCARDIA ABLATION N/A 10/13/2014   Procedure: SUPRAVENTRICULAR TACHYCARDIA ABLATION;  Surgeon: Evans Lance, MD;  Location: [facility] CATH LAB;  Service: Cardiovascular;  Laterality: N/A;    Family History  Problem Relation Age of Onset  . Hypertension Mother   . Heart disease  Father   . Hypertension Sister   . Hypertension Brother   . Ulcers Sister   . Hypertension Sister   . Diabetes Neg Hx   . Coronary artery disease Neg Hx     SOCIAL HISTORY: Social History   Socioeconomic History  . Marital status: Married    Spouse name: Not on file  . Number of children: 4  . Years of education: Not on file  . Highest education level: Not on file  Occupational History  . Occupation: Retired  Tobacco Use  . Smoking status: Never Smoker  . Smokeless tobacco: Never Used  Vaping Use  . Vaping Use: Never used  Substance and Sexual Activity  . Alcohol use: No  . Drug use: No  . Sexual activity: Not on file  Other Topics Concern  . Not on file  Social History Narrative   Regular exercise: Yes   Social Determinants of Health   Financial Resource Strain:   . Difficulty of Paying Living Expenses: Not on file  Food Insecurity:   . Worried About Charity fundraiser in the Last Year: Not on file  . Ran Out of Food in the Last Year: Not on file  Transportation Needs:   . Lack of Transportation (Medical): Not on file  . Lack of Transportation (Non-Medical): Not on file  Physical Activity:   . Days of Exercise per Week: Not on file  . Minutes of Exercise per Session: Not on file  Stress:   . Feeling  of Stress : Not on file  Social Connections:   . Frequency of Communication with Friends and Family: Not on file  . Frequency of Social Gatherings with Friends and Family: Not on file  . Attends Religious Services: Not on file  . Active Member of Clubs or Organizations: Not on file  . Attends Archivist Meetings: Not on file  . Marital Status: Not on file  Intimate Partner Violence:   . Fear of Current or Ex-Partner: Not on file  . Emotionally Abused: Not on file  . Physically Abused: Not on file  . Sexually Abused: Not on file    Allergies  Allergen Reactions  . Amoxicillin     GI upset: Bleeding diarrhea  Other reaction(s): GI Bleed  .  Ibuprofen     Bloody diarrhea   . Penicillins     Bloody diarrhea   . Ace Inhibitors     Face swelling   . Amlodipine Swelling  . Spironolactone     Hyponatremia   . Atorvastatin     Weakness   . Azithromycin     Stomach ache   . Diltiazem Nausea Only    Upset stomach per patient  . Hydrochlorothiazide Other (See Comments)    Photosensitivity    Current Outpatient Medications  Medication Sig Dispense Refill  . acebutolol (SECTRAL) 200 MG capsule Take 1 capsule (200 mg total) by mouth 2 (two) times daily. 60 capsule 3  . ALPRAZolam (XANAX) 0.25 MG tablet Take 1 tablet (0.25 mg total) by mouth at bedtime as needed for sleep. 30 tablet 0  . b complex vitamins tablet Take 1 tablet by mouth daily.    . cholecalciferol (VITAMIN D) 1000 UNITS tablet Take 1,000 Units by mouth daily.    . Coenzyme Q10 (CO Q-10 PO) Take 1 capsule by mouth daily. (200mg )    . ELIQUIS 5 MG TABS tablet Take 1 tablet by mouth twice daily (Patient taking differently: Take 5 mg by mouth 2 (two) times daily. ) 062 tablet 0  . folic acid (FOLVITE) 376 MCG tablet Take 800 mcg by mouth daily.      . hydrALAZINE (APRESOLINE) 25 MG tablet Take 1 tablet (25 mg total) by mouth 3 (three) times daily. Take one if BP >140/70 (Patient taking differently: Take 50 mg by mouth 3 (three) times daily. Take one if BP >140/70)    . L-Arginine 1000 MG TABS Take 1 tablet by mouth daily.     . LUTEIN PO Take 1 tablet by mouth daily.    . Magnesium 400 MG TABS Take 400 mg by mouth daily.     Marland Kitchen omega-3 acid ethyl esters (LOVAZA) 1 g capsule Take 1 g by mouth daily.    . Probiotic Product (ALIGN PO) Take 1 capsule by mouth daily.     . PSYLLIUM PO Take 1 tablet by mouth daily.    . rosuvastatin (CRESTOR) 10 MG tablet Take 10 mg by mouth daily.      . valsartan (DIOVAN) 160 MG tablet Take 1 tablet (160 mg total) by mouth daily. 90 tablet 3  . vitamin C (ASCORBIC ACID) 500 MG tablet Take 500 mg by mouth daily.    Marland Kitchen zinc gluconate 50  MG tablet Take 50 mg by mouth daily.      No current facility-administered medications for this visit.    ROS:   General:  No weight loss, Fever, chills  HEENT: No recent headaches, no nasal bleeding, no visual changes,  no sore throat  Neurologic: No dizziness, blackouts, seizures. No recent symptoms of stroke or mini- stroke. No recent episodes of slurred speech, or temporary blindness.  Cardiac: No recent episodes of chest pain/pressure, no shortness of breath at rest.  No shortness of breath with exertion.  Denies history of atrial fibrillation or irregular heartbeat  Vascular: No history of rest pain in feet.  No history of claudication.  No history of non-healing ulcer, No history of DVT   Pulmonary: No home oxygen, no productive cough, no hemoptysis,  No asthma or wheezing  Musculoskeletal:  [ ]  Arthritis, [ ]  Low back pain,  [ ]  Joint pain  Hematologic:No history of hypercoagulable state.  No history of easy bleeding.  No history of anemia  Gastrointestinal: No hematochezia or melena,  No gastroesophageal reflux, no trouble swallowing  Urinary: [ ]  chronic Kidney disease, [ ]  on HD - [ ]  MWF or [ ]  TTHS, [ ]  Burning with urination, [ ]  Frequent urination, [ ]  Difficulty urinating;   Skin: No rashes  Psychological: No history of anxiety,  No history of depression   Physical Examination  Vitals:   07/14/20 1503 07/14/20 1507  BP: (!) 193/97 (!) 178/97  Pulse: 81   Resp: 20   Temp: 98 F (36.7 C)   SpO2: 99%   Weight: 158 lb (71.7 kg)   Height: 5\' 7"  (1.702 m)     Body mass index is 24.75 kg/m.  General:  Alert and oriented, no acute distress HEENT: Normal Neck: No JVD Cardiac: Regular Rate and Rhythm Extremity Pulses:  2+ femoral, 2+ left absent right dorsalis pedis, 2+ right absent left posterior tibial pulses bilaterally Musculoskeletal: No deformity or edema  Neurologic: Upper and lower extremity motor 5/5 and symmetric, no facial asymmetry  DATA:  I  reviewed the patient's CT angio which shows right subclavian artery stenosis with possible retrograde right vertebral artery, greater than 80% left internal carotid artery stenosis with soft plaque  ASSESSMENT: Asymptomatic greater than 80% left internal carotid artery stenosis   PLAN: Left TCAR stent scheduled for August 01, 2020.  Patient will be maintained on Plavix aspirin and a statin in addition to his Eliquis.  We will maintain antiplatelet therapy for at least 6 weeks postop and then most likely discontinue at that point.  Risk of bleeding stroke cranial nerve injuries were discussed with the patient and his wife today.  We will stop his Eliquis 3 days prior.   Ruta Hinds, MD [facility] Office: [phone]

## 2020-07-14 NOTE — Progress Notes (Signed)
Referring Physician: Dr Nadyne Coombes  Patient name: Dalton Cardenas MRN: [id number] DOB: 1933-12-26 Sex: male  REASON FOR CONSULT: Greater than 80% left internal carotid artery stenosis  HPI: Dalton Cardenas is a 86 y.o. male, noted to have greater than 80% left internal carotid artery stenosis after follow-up for an asymptomatic bruit.  Patient has no prior symptoms of TIA or stroke.  He is currently on Eliquis for cardiac arrhythmia.  He has never had an embolic event.  Other medical problems include hypertension hypercholesterolemia coronary artery disease all of which have been stable.  Past Medical History:  Diagnosis Date   Coronary artery disease    60-70% circumflex (cath in [place] 08/2010).   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 Holter monitoring 2011 - AV node reentry, multifocal atrial tachycardia, and resting bradycardia. b. previously tx with Abana in [place].   Past Surgical History:  Procedure Laterality Date   ABLATION OF DYSRHYTHMIC FOCUS  10/13/2014   SVT         by Dr Jacquelyne Balint FISSURE REPAIR     CARDIOVERSION N/A 07/01/2020   Procedure: CARDIOVERSION;  Surgeon: Adrian Prows, MD;  Location: [facility];  Service: Cardiovascular;  Laterality: N/A;   ELECTROPHYSIOLOGY STUDY N/A 10/13/2014   Procedure: ELECTROPHYSIOLOGY STUDY;  Surgeon: Evans Lance, MD;  Location: [facility] CATH LAB;  Service: Cardiovascular;  Laterality: N/A;   PROSTATECTOMY     SUPRAVENTRICULAR TACHYCARDIA ABLATION N/A 10/13/2014   Procedure: SUPRAVENTRICULAR TACHYCARDIA ABLATION;  Surgeon: Evans Lance, MD;  Location: [facility] CATH LAB;  Service: Cardiovascular;  Laterality: N/A;    Family History  Problem Relation Age of Onset   Hypertension Mother    Heart disease  Father    Hypertension Sister    Hypertension Brother    Ulcers Sister    Hypertension Sister    Diabetes Neg Hx    Coronary artery disease Neg Hx     SOCIAL HISTORY: Social History   Socioeconomic History   Marital status: Married    Spouse name: Not on file   Number of children: 4   Years of education: Not on file   Highest education level: Not on file  Occupational History   Occupation: Retired  Tobacco Use   Smoking status: Never Smoker   Smokeless tobacco: Never Used  Scientific laboratory technician Use: Never used  Substance and Sexual Activity   Alcohol use: No   Drug use: No   Sexual activity: Not on file  Other Topics Concern   Not on file  Social History Narrative   Regular exercise: Yes   Social Determinants of Health   Financial Resource Strain:    Difficulty of Paying Living Expenses: Not on file  Food Insecurity:    Worried About Charity fundraiser in the Last Year: Not on file   [employer] of Food in the Last Year: Not on file  Transportation Needs:    Lack of Transportation (Medical): Not on file   Lack of Transportation (Non-Medical): Not on file  Physical Activity:    Days of Exercise per Week: Not on file   Minutes of Exercise per Session: Not on file  Stress:    Feeling  of Stress : Not on file  Social Connections:    Frequency of Communication with Friends and Family: Not on file   Frequency of Social Gatherings with Friends and Family: Not on file   Attends Religious Services: Not on file   Active Member of Clubs or Organizations: Not on file   Attends Archivist Meetings: Not on file   Marital Status: Not on file  Intimate Partner Violence:    Fear of Current or Ex-Partner: Not on file   Emotionally Abused: Not on file   Physically Abused: Not on file   Sexually Abused: Not on file    Allergies  Allergen Reactions   Amoxicillin     GI upset: Bleeding diarrhea  Other reaction(s): GI Bleed    Ibuprofen     Bloody diarrhea    Penicillins     Bloody diarrhea    Ace Inhibitors     Face swelling    Amlodipine Swelling   Spironolactone     Hyponatremia    Atorvastatin     Weakness    Azithromycin     Stomach ache    Diltiazem Nausea Only    Upset stomach per patient   Hydrochlorothiazide Other (See Comments)    Photosensitivity    Current Outpatient Medications  Medication Sig Dispense Refill   acebutolol (SECTRAL) 200 MG capsule Take 1 capsule (200 mg total) by mouth 2 (two) times daily. 60 capsule 3   ALPRAZolam (XANAX) 0.25 MG tablet Take 1 tablet (0.25 mg total) by mouth at bedtime as needed for sleep. 30 tablet 0   b complex vitamins tablet Take 1 tablet by mouth daily.     cholecalciferol (VITAMIN D) 1000 UNITS tablet Take 1,000 Units by mouth daily.     Coenzyme Q10 (CO Q-10 PO) Take 1 capsule by mouth daily. (200mg )     ELIQUIS 5 MG TABS tablet Take 1 tablet by mouth twice daily (Patient taking differently: Take 5 mg by mouth 2 (two) times daily. ) 505 tablet 0   folic acid (FOLVITE) 397 MCG tablet Take 800 mcg by mouth daily.       hydrALAZINE (APRESOLINE) 25 MG tablet Take 1 tablet (25 mg total) by mouth 3 (three) times daily. Take one if BP >140/70 (Patient taking differently: Take 50 mg by mouth 3 (three) times daily. Take one if BP >140/70)     L-Arginine 1000 MG TABS Take 1 tablet by mouth daily.      LUTEIN PO Take 1 tablet by mouth daily.     Magnesium 400 MG TABS Take 400 mg by mouth daily.      omega-3 acid ethyl esters (LOVAZA) 1 g capsule Take 1 g by mouth daily.     Probiotic Product (ALIGN PO) Take 1 capsule by mouth daily.      PSYLLIUM PO Take 1 tablet by mouth daily.     rosuvastatin (CRESTOR) 10 MG tablet Take 10 mg by mouth daily.       valsartan (DIOVAN) 160 MG tablet Take 1 tablet (160 mg total) by mouth daily. 90 tablet 3   vitamin C (ASCORBIC ACID) 500 MG tablet Take 500 mg by mouth daily.     zinc gluconate 50  MG tablet Take 50 mg by mouth daily.      No current facility-administered medications for this visit.    ROS:   General:  No weight loss, Fever, chills  HEENT: No recent headaches, no nasal bleeding, no visual changes,  no sore throat  Neurologic: No dizziness, blackouts, seizures. No recent symptoms of stroke or mini- stroke. No recent episodes of slurred speech, or temporary blindness.  Cardiac: No recent episodes of chest pain/pressure, no shortness of breath at rest.  No shortness of breath with exertion.  Denies history of atrial fibrillation or irregular heartbeat  Vascular: No history of rest pain in feet.  No history of claudication.  No history of non-healing ulcer, No history of DVT   Pulmonary: No home oxygen, no productive cough, no hemoptysis,  No asthma or wheezing  Musculoskeletal:  [ ]  Arthritis, [ ]  Low back pain,  [ ]  Joint pain  Hematologic:No history of hypercoagulable state.  No history of easy bleeding.  No history of anemia  Gastrointestinal: No hematochezia or melena,  No gastroesophageal reflux, no trouble swallowing  Urinary: [ ]  chronic Kidney disease, [ ]  on HD - [ ]  MWF or [ ]  TTHS, [ ]  Burning with urination, [ ]  Frequent urination, [ ]  Difficulty urinating;   Skin: No rashes  Psychological: No history of anxiety,  No history of depression   Physical Examination  Vitals:   07/14/20 1503 07/14/20 1507  BP: (!) 193/97 (!) 178/97  Pulse: 81   Resp: 20   Temp: 98 F (36.7 C)   SpO2: 99%   Weight: 158 lb (71.7 kg)   Height: 5\' 7"  (1.702 m)     Body mass index is 24.75 kg/m.  General:  Alert and oriented, no acute distress HEENT: Normal Neck: No JVD Cardiac: Regular Rate and Rhythm Extremity Pulses:  2+ femoral, 2+ left absent right dorsalis pedis, 2+ right absent left posterior tibial pulses bilaterally Musculoskeletal: No deformity or edema  Neurologic: Upper and lower extremity motor 5/5 and symmetric, no facial asymmetry  DATA:  I  reviewed the patient's CT angio which shows right subclavian artery stenosis with possible retrograde right vertebral artery, greater than 80% left internal carotid artery stenosis with soft plaque  ASSESSMENT: Asymptomatic greater than 80% left internal carotid artery stenosis   PLAN: Left TCAR stent scheduled for August 01, 2020.  Patient will be maintained on Plavix aspirin and a statin in addition to his Eliquis.  We will maintain antiplatelet therapy for at least 6 weeks postop and then most likely discontinue at that point.  Risk of bleeding stroke cranial nerve injuries were discussed with the patient and his wife today.  We will stop his Eliquis 3 days prior.   Ruta Hinds, MD [facility] Office: [phone]

## 2020-07-15 ENCOUNTER — Other Ambulatory Visit: Payer: Self-pay

## 2020-07-25 ENCOUNTER — Ambulatory Visit: Payer: Medicare Other | Admitting: Cardiology

## 2020-07-25 NOTE — Progress Notes (Addendum)
[place], [place] - [address] [employer] [place] [address] Phone: [phone] Fax: [phone]      Your procedure is scheduled on Monday November 15th.  Report to [facility] Main Entrance "A" at 5:30 A.M., and check in at the Admitting office.  Call this number if you have problems the morning of surgery:  [phone]  Call [phone] if you have any questions prior to your surgery date Monday-Friday 8am-4pm    Remember:  Do not eat or drink anything after midnight the night before your surgery   Take these medicines the morning of surgery with A SIP OF WATER   acebutolol (SECTRAL) 200 MG capsule  aspirin EC 81 MG tablet  clopidogrel (PLAVIX) 75 MG tablet - patient to call and check with Dr. Oneida Alar on stop date.   rosuvastatin (CRESTOR) 10 MG tablet   If needed  hydrALAZINE (APRESOLINE) 25 MG tablet   Follow your surgeon's instructions on when to stop your Eliquis and Plavix. If no instructions were given please call the surgeon's office.     As of today, STOP taking any Aspirin (unless otherwise instructed by your surgeon) Aleve, Naproxen, Ibuprofen, Motrin, Advil, Goody's, BC's, all herbal medications, fish oil, and all vitamins.                     Do not wear jewelry            Do not wear lotions, powders, colognes, or deodorant.            Do not shave 48 hours prior to surgery.  Men may shave face and neck.            Do not bring valuables to the hospital.            [facility] is not responsible for any belongings or valuables.  Do NOT Smoke (Tobacco/Vaping) or drink Alcohol 24 hours prior to your procedure If you use a CPAP at night, you may bring all equipment for your overnight stay.   Contacts, glasses, dentures or bridgework may not be worn into surgery.      For patients admitted to the hospital, discharge time will be determined by your treatment team.   Patients discharged the day of surgery will  not be allowed to drive home, and someone needs to stay with them for 24 hours.    Special instructions:   [place]- Preparing For Surgery  Before surgery, you can play an important role. Because skin is not sterile, your skin needs to be as free of germs as possible. You can reduce the number of germs on your skin by washing with CHG (chlorahexidine gluconate) Soap before surgery.  CHG is an antiseptic cleaner which kills germs and bonds with the skin to continue killing germs even after washing.    Oral Hygiene is also important to reduce your risk of infection.  Remember - BRUSH YOUR TEETH THE MORNING OF SURGERY WITH YOUR REGULAR TOOTHPASTE  Please do not use if you have an allergy to CHG or antibacterial soaps. If your skin becomes reddened/irritated stop using the CHG.  Do not shave (including legs and underarms) for at least 48 hours prior to first CHG shower. It is OK to shave your face.  Please follow these instructions carefully.   1. Shower the NIGHT BEFORE SURGERY and the MORNING OF SURGERY with CHG Soap.   2. If you chose to wash your hair, wash  your hair first as usual with your normal shampoo.  3. After you shampoo, rinse your hair and body thoroughly to remove the shampoo.  4. Use CHG as you would any other liquid soap. You can apply CHG directly to the skin and wash gently with a scrungie or a clean washcloth.   5. Apply the CHG Soap to your body ONLY FROM THE NECK DOWN.  Do not use on open wounds or open sores. Avoid contact with your eyes, ears, mouth and genitals (private parts). Wash Face and genitals (private parts)  with your normal soap.   6. Wash thoroughly, paying special attention to the area where your surgery will be performed.  7. Thoroughly rinse your body with warm water from the neck down.  8. DO NOT shower/wash with your normal soap after using and rinsing off the CHG Soap.  9. Pat yourself dry with a CLEAN TOWEL.  10. Wear CLEAN PAJAMAS to bed  the night before surgery  11. Place CLEAN SHEETS on your bed the night of your first shower and DO NOT SLEEP WITH PETS.   Day of Surgery: Wear Clean/Comfortable clothing the morning of surgery Do not apply any deodorants/lotions.   Remember to brush your teeth WITH YOUR REGULAR TOOTHPASTE.   Please read over the following fact sheets that you were given.

## 2020-07-26 ENCOUNTER — Other Ambulatory Visit: Payer: Self-pay

## 2020-07-26 ENCOUNTER — Encounter (HOSPITAL_COMMUNITY)
Admission: RE | Admit: 2020-07-26 | Discharge: 2020-07-26 | Disposition: A | Discharge status: Home / Self Care | Payer: Medicare Other | Source: Ambulatory Visit | Attending: Vascular Surgery | Admitting: Vascular Surgery

## 2020-07-26 ENCOUNTER — Encounter (HOSPITAL_COMMUNITY): Payer: Self-pay

## 2020-07-26 DIAGNOSIS — Z01812 Encounter for preprocedural laboratory examination: Secondary | ICD-10-CM | POA: Diagnosis not present

## 2020-07-26 HISTORY — DX: Cardiac arrhythmia, unspecified: I49.9

## 2020-07-26 HISTORY — DX: Gastro-esophageal reflux disease without esophagitis: K21.9

## 2020-07-26 LAB — URINALYSIS, COMPLETE (UACMP) WITH MICROSCOPIC
Bacteria, UA: NONE SEEN
Bilirubin Urine: NEGATIVE
Glucose, UA: NEGATIVE mg/dL
Hgb urine dipstick: NEGATIVE
Ketones, ur: NEGATIVE mg/dL
Leukocytes,Ua: NEGATIVE
Nitrite: NEGATIVE
Protein, ur: NEGATIVE mg/dL
Specific Gravity, Urine: 1.003 — ABNORMAL LOW (ref 1.005–1.030)
pH: 7 (ref 5.0–8.0)

## 2020-07-26 LAB — COMPREHENSIVE METABOLIC PANEL
ALT: 23 U/L (ref 0–44)
AST: 26 U/L (ref 15–41)
Albumin: 4 g/dL (ref 3.5–5.0)
Alkaline Phosphatase: 63 U/L (ref 38–126)
Anion gap: 9 (ref 5–15)
BUN: 16 mg/dL (ref 8–23)
CO2: 23 mmol/L (ref 22–32)
Calcium: 9.6 mg/dL (ref 8.9–10.3)
Chloride: 103 mmol/L (ref 98–111)
Creatinine, Ser: 1 mg/dL (ref 0.61–1.24)
GFR, Estimated: >60 mL/min (ref >60)
Glucose, Bld: 103 mg/dL — ABNORMAL HIGH (ref 70–99)
Potassium: 4.4 mmol/L (ref 3.5–5.1)
Sodium: 135 mmol/L (ref 135–145)
Total Bilirubin: 1.1 mg/dL (ref 0.3–1.2)
Total Protein: 7.4 g/dL (ref 6.5–8.1)

## 2020-07-26 LAB — CBC
HCT: 39.8 % (ref 39.0–52.0)
Hemoglobin: 12.8 g/dL — ABNORMAL LOW (ref 13.0–17.0)
MCH: 30 pg (ref 26.0–34.0)
MCHC: 32.2 g/dL (ref 30.0–36.0)
MCV: 93.2 fL (ref 80.0–100.0)
Platelets: 296 10*3/uL (ref 150–400)
RBC: 4.27 MIL/uL (ref 4.22–5.81)
RDW: 13.1 % (ref 11.5–15.5)
WBC: 8.6 10*3/uL (ref 4.0–10.5)
nRBC: 0 % (ref 0.0–0.2)

## 2020-07-26 LAB — TYPE AND SCREEN
ABO/RH(D): AB POS
Antibody Screen: NEGATIVE

## 2020-07-26 LAB — PROTIME-INR
INR: 1.1 (ref 0.8–1.2)
Prothrombin Time: 13.8 seconds (ref 11.4–15.2)

## 2020-07-26 LAB — SURGICAL PCR SCREEN
MRSA, PCR: NEGATIVE
Staphylococcus aureus: NEGATIVE

## 2020-07-26 LAB — APTT: aPTT: 31 seconds (ref 24–36)

## 2020-07-26 NOTE — Progress Notes (Signed)
PCP - Dr. Leighton Ruff Cardiologist - Dr. Adrian Prows  Chest x-ray - 06/30/20 EKG - 07/11/20 Stress Test - 08/07/12 ECHO - 06/05/20 Cardiac Cath - 11/16/11  Sleep Study - 12/15/19 Yes has OSA CPAP - Nightly  DM - Denies  Blood Thinner Instructions: Eliquis last dose 07/29/20 Plavix last dose 07/31/20 Aspirin Instructions: Continue through DOS  COVID TEST- 07/29/20  Anesthesia review: Yes cardiac history  Patient denies shortness of breath, fever, cough and chest pain at PAT appointment   All instructions explained to the patient, with a verbal understanding of the material. Patient agrees to go over the instructions while at home for a better understanding. Patient also instructed to self quarantine after being tested for COVID-19. The opportunity to ask questions was provided.

## 2020-07-27 NOTE — Progress Notes (Addendum)
Anesthesia Chart Review:  Case: [id number] Date/Time: 08/01/20 0715   Procedure: LEFT TRANSCAROTID ARTERY REVASCULARIZATION (Left )   Anesthesia type: General   Pre-op diagnosis: LEFT CAROTID ARTERY STENOSIS   Location: [facility] OR ROOM 63 / [facility] OR   Surgeons: Elam Dutch, MD      DISCUSSION: Patient is an 86 year old male scheduled for the above procedure. He was referred to to [facility] by Dr. Einar Gip after 06/28/20 CTA neck showed severe proximal right SCA stenosis and 80% proximal left ICA stenosis. Per Dr. Oneida Alar' notes, "Patient will be maintained on Plavix aspirin and a statin in addition to his Eliquis.  We will maintain antiplatelet therapy for at least 6 weeks postop and then most likely discontinue at that point... 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 He was hospitalized 06/30/20-07/02/20 with vomitng and found to have AKI and  hyponatremia with Na 119. He had been started on spironolactone a few weeks prior, so thought to be one of the contributing factors and discontinued. He was found to be in atrial flutter-->afib and underwent successful DCCV by Dr. Einar Gip. He was already on Eliquis. (Notes indicate that he has not been able to tolerate high doses of beta-blockers due to marked bradycardia, Mobitz 2 AV block.)   Last cardiology visit 07/11/20 by Lawerance Cruel, PA-C for hospital follow-up. She was aware of upcoming evaluation with Dr. Oneida Alar  for LICA/RSCA stenosis.    PAT RN documented plans for: Last Eliquis 07/29/20. Last Plavix 07/31/20 (since he takes in the PM). He is also to continue ASA and statin therapy.  Pre-procedure COVID-19 test is scheduled for 07/29/20.  Anesthesia team to evaluate on the day of surgery.    VS: BP (!) 135/95   Pulse 71   Temp 36.8 C (Oral)   Resp 18   Ht 5\' 7"  (1.702 m)   Wt 72.4 kg   SpO2 98%   BMI 24.99 kg/m     PROVIDERS: Leighton Ruff, MD is PCP  Adrian Prows, MD is primary cardiologist Cristopher Peru, MD is EP cardiologist. Last visit 05/10/17. Kara Mead, MD is pulmonologist. Last visit 07/07/20.    LABS: Labs reviewed: Acceptable for surgery. (all labs ordered are listed, but only abnormal results are displayed)  Labs Reviewed  CBC - Abnormal; Notable for the following components:      Result Value   Hemoglobin 12.8 (*)    All other components within normal limits  COMPREHENSIVE METABOLIC PANEL - Abnormal; Notable for the following components:   Glucose, Bld 103 (*)    All other components within normal limits  URINALYSIS, COMPLETE (UACMP) WITH MICROSCOPIC - Abnormal; Notable for the following components:   Color, Urine STRAW (*)    Specific Gravity, Urine 1.003 (*)    All other components within normal limits  SURGICAL PCR SCREEN  APTT  PROTIME-INR  TYPE AND SCREEN     IMAGES: CTA Head 07/12/20: IMPRESSION: - No acute intracranial abnormality. Mild chronic microvascular ischemic changes. Chronic right caudate infarct. - Short segment marked stenosis of the distal intracranial right vertebral artery.  CXR 06/30/20: FINDINGS: Lungs are clear.  No pleural effusion or pneumothorax. The heart is normal in size.  Thoracic aortic atherosclerosis. Degenerative changes of the visualized thoracolumbar spine.  IMPRESSION: Normal chest radiographs.  CT angio neck 06/28/2020: Impression: 1. Noncalcified atherosclerotic disease in the bifurcation of the right ICA <50% stenosis. 2. 80% stenosis of the proximal left internal carotid artery secondary to noncalcified plaque. 3. Severe stenosis of the proximal right subclavian artery and decreased enhancement of the right vertebral artery,  consistent with retrograde flow (subclavian steal physiology). 4. Aortic Atherosclerosis (ICD10-I70.0). - Dr. Einar Gip reviewed and referred patient to vascular surgery for evaluation.   EKG: EKG 07/11/2020: Sinus rhythm at a rate of 70 bpm with first-degree AV block, left atrial enlargement.  Left axis deviation, left anterior fascicular block.  Poor R wave progression, cannot exclude anteroseptal infarct old.  Compared to EKG/16/2021, no significant change.   CV: Carotid artery duplex 06/03/2020: Stenosis in the right internal carotid artery (16-49%). Stenosis in the left internal carotid artery (>=70%). The left PSV internal/common carotid artery ratio is consistent with a stenosis of >70%. The duplex suggest near subtotal occlusion with soft plaque at the level of the proximal left ICA.  Recommend different modality imaging to confirm severity of the disease.  Follow up study in six months is appropriate if clinically indicated.  Echocardiogram 06/03/2020:  Left ventricle cavity is normal in size. Moderate asymmetric hypertrophy  of the left ventricle. Basal septum measures at 2.0 cm. Mild LVOT peak  gradient 5 mmHg without SAM or LVOT obstruction.  Normal wall motion. LVEF 55-60%. Indeterminate diastolic dysfunction.  Left atrial cavity is mildly dilated.  Trileaflet aortic valve. Trace aortic stenosis. Moderate (Grade II) aortic  regurgitation.  Mild calcification, No significant stenosis. Moderate (Grade II) mitral  regurgitation.  Mild tricuspid regurgitation. Estimated pulmonary artery systolic pressure  30 mmHg.  No significant change compared to previous study in 2018.  Other Cardiac Studies outlined in [place] [facility] notes: - Coronary Angiogram  [2011]: Performed in [place], 70% stenosis in the circumflex coronary artery. Was performed as a part of workup for EP study for the SVT.  - Nuclear stress test  [04/26/2010]: Myocardial perfusion scan 04/26/2010: Normal  perfusion, EF 77%.  - EP study and catheter ablation of AVNRT using Isuprel  [10/13/2014]: Successful slow pathway ablation of AVNRT in a patient with non-sustained AVNRT and a h/o incessant AVNRT.   Past Medical History:  Diagnosis Date  . Coronary artery disease    60-70% circumflex (cath in [place] 08/2010).  Marland Kitchen Dysrhythmia    A history of A flutter and tachycardia (SVT)  . GERD (gastroesophageal reflux disease)   . Hypercholesterolemia   . Hypertension   . Prostate cancer [facility])    status post prostatectomy  . Sleep apnea   . 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 SVT (supraventricular tachycardia) ([place])    a. Holter monitoring 2011 - AV node reentry, multifocal atrial tachycardia, and resting bradycardia. b. previously tx with Abana in [place].    Past Surgical History:  Procedure Laterality Date  . ABLATION OF DYSRHYTHMIC FOCUS  10/13/2014   SVT         by Dr Lovena Le  . ANAL FISSURE REPAIR    . CARDIOVERSION N/A 07/01/2020   Procedure: CARDIOVERSION;  Surgeon: Adrian Prows, MD;  Location: [place];  Service: Cardiovascular;  Laterality: N/A;  . ELECTROPHYSIOLOGY STUDY N/A 10/13/2014   Procedure: ELECTROPHYSIOLOGY STUDY;  Surgeon: Evans Lance, MD;  Location: [facility] CATH LAB;  Service: Cardiovascular;  Laterality: N/A;  . EYE SURGERY Bilateral 2011   cataract   .  PROSTATECTOMY    . SUPRAVENTRICULAR TACHYCARDIA ABLATION N/A 10/13/2014   Procedure: SUPRAVENTRICULAR TACHYCARDIA ABLATION;  Surgeon: Evans Lance, MD;  Location: [facility] CATH LAB;  Service: Cardiovascular;  Laterality: N/A;    MEDICATIONS: . acebutolol (SECTRAL) 200 MG capsule  . ALPRAZolam (XANAX) 0.25 MG tablet  . aspirin EC 81 MG tablet  . b complex vitamins tablet  . Cholecalciferol (VITAMIN D) 125 MCG (5000 UT) CAPS  . Chromium 1000 MCG TABS  . clopidogrel (PLAVIX) 75 MG tablet  . Coenzyme Q10 (CO Q-10) 200 MG CAPS  . Cyanocobalamin (B-12)  5000 MCG CAPS  . ELIQUIS 5 MG TABS tablet  . Ferrous Sulfate 27 MG TABS  . folic acid (FOLVITE) 563 MCG tablet  . hydrALAZINE (APRESOLINE) 25 MG tablet  . L-Arginine 1000 MG TABS  . LUTEIN PO  . Magnesium 250 MG TABS  . omega-3 acid ethyl esters (LOVAZA) 1 g capsule  . PAPAYA ENZYME PO  . Probiotic Product (ALIGN PO)  . PSYLLIUM PO  . rosuvastatin (CRESTOR) 10 MG tablet  . valsartan (DIOVAN) 160 MG tablet  . Vitamin A 2400 MCG (8000 UT) CAPS  . vitamin C (ASCORBIC ACID) 500 MG tablet  . zinc gluconate 50 MG tablet   No current facility-administered medications for this encounter.    Myra Gianotti, PA-C Surgical Short Stay/Anesthesiology [facility] Phone [phone] [facility] Phone [phone] 07/27/2020 4:12 PM

## 2020-07-27 NOTE — Anesthesia Preprocedure Evaluation (Addendum)
Anesthesia Evaluation  Patient identified by MRN, date of birth, ID band Patient awake    Reviewed: Allergy & Precautions, NPO status , Patient's Chart, lab work & pertinent test results  Airway Mallampati: II  TM Distance: >3 FB Neck ROM: Full    Dental  (+) Dental Advisory Given, Teeth Intact   Pulmonary sleep apnea ,    Pulmonary exam normal breath sounds clear to auscultation       Cardiovascular hypertension, Pt. on medications + CAD and + Peripheral Vascular Disease  Normal cardiovascular exam+ dysrhythmias  Rhythm:Regular Rate:Normal     Neuro/Psych negative neurological ROS     GI/Hepatic Neg liver ROS, GERD  ,  Endo/Other  negative endocrine ROS  Renal/GU Renal disease     Musculoskeletal negative musculoskeletal ROS (+)   Abdominal   Peds  Hematology negative hematology ROS (+)   Anesthesia Other Findings   Reproductive/Obstetrics                            Anesthesia Physical Anesthesia Plan  ASA: III  Anesthesia Plan: General   Post-op Pain Management:    Induction: Intravenous  PONV Risk Score and Plan: 2 and Ondansetron, Dexamethasone and Treatment may vary due to age or medical condition  Airway Management Planned: Oral ETT  Additional Equipment: Arterial line  Intra-op Plan:   Post-operative Plan: Extubation in OR  Informed Consent: I have reviewed the patients History and Physical, chart, labs and discussed the procedure including the risks, benefits and alternatives for the proposed anesthesia with the patient or authorized representative who has indicated his/her understanding and acceptance.     Dental advisory given  Plan Discussed with: CRNA  Anesthesia Plan Comments: (PAT note written by Myra Gianotti, PA-C. )      Anesthesia Quick Evaluation

## 2020-07-29 ENCOUNTER — Other Ambulatory Visit (HOSPITAL_COMMUNITY)
Admission: RE | Admit: 2020-07-29 | Discharge: 2020-07-29 | Disposition: A | Discharge status: Home / Self Care | Payer: Medicare Other | Source: Ambulatory Visit | Attending: Vascular Surgery | Admitting: Vascular Surgery

## 2020-07-29 DIAGNOSIS — Z20822 Contact with and (suspected) exposure to covid-19: Secondary | ICD-10-CM | POA: Insufficient documentation

## 2020-07-29 DIAGNOSIS — Z01812 Encounter for preprocedural laboratory examination: Secondary | ICD-10-CM | POA: Insufficient documentation

## 2020-07-29 LAB — SARS CORONAVIRUS 2 (TAT 6-24 HRS): SARS Coronavirus 2: NEGATIVE

## 2020-08-01 ENCOUNTER — Inpatient Hospital Stay (HOSPITAL_COMMUNITY): Payer: Medicare Other | Admitting: Vascular Surgery

## 2020-08-01 ENCOUNTER — Inpatient Hospital Stay (HOSPITAL_COMMUNITY): Payer: Medicare Other

## 2020-08-01 ENCOUNTER — Encounter (HOSPITAL_COMMUNITY)
Admission: RE | Disposition: A | Discharge status: Home / Self Care | Payer: Self-pay | Source: Home / Self Care | Attending: Vascular Surgery

## 2020-08-01 ENCOUNTER — Encounter (HOSPITAL_COMMUNITY): Payer: Self-pay | Admitting: Vascular Surgery

## 2020-08-01 ENCOUNTER — Other Ambulatory Visit: Payer: Self-pay

## 2020-08-01 ENCOUNTER — Inpatient Hospital Stay (HOSPITAL_COMMUNITY): Payer: Medicare Other | Admitting: Anesthesiology

## 2020-08-01 ENCOUNTER — Inpatient Hospital Stay (HOSPITAL_COMMUNITY)
Admission: RE | Admit: 2020-08-01 | Discharge: 2020-08-02 | DRG: 036 | Disposition: A | Discharge status: Home / Self Care | Payer: Medicare Other | Attending: Vascular Surgery | Admitting: Vascular Surgery

## 2020-08-01 DIAGNOSIS — Z888 Allergy status to other drugs, medicaments and biological substances status: Secondary | ICD-10-CM | POA: Diagnosis not present

## 2020-08-01 DIAGNOSIS — Z8546 Personal history of malignant neoplasm of prostate: Secondary | ICD-10-CM

## 2020-08-01 DIAGNOSIS — I10 Essential (primary) hypertension: Secondary | ICD-10-CM | POA: Diagnosis present

## 2020-08-01 DIAGNOSIS — Z79899 Other long term (current) drug therapy: Secondary | ICD-10-CM | POA: Diagnosis not present

## 2020-08-01 DIAGNOSIS — Z8249 Family history of ischemic heart disease and other diseases of the circulatory system: Secondary | ICD-10-CM

## 2020-08-01 DIAGNOSIS — I6529 Occlusion and stenosis of unspecified carotid artery: Secondary | ICD-10-CM | POA: Diagnosis present

## 2020-08-01 DIAGNOSIS — I6522 Occlusion and stenosis of left carotid artery: Secondary | ICD-10-CM | POA: Diagnosis present

## 2020-08-01 DIAGNOSIS — K219 Gastro-esophageal reflux disease without esophagitis: Secondary | ICD-10-CM | POA: Diagnosis present

## 2020-08-01 DIAGNOSIS — I739 Peripheral vascular disease, unspecified: Secondary | ICD-10-CM | POA: Diagnosis present

## 2020-08-01 DIAGNOSIS — E7800 Pure hypercholesterolemia, unspecified: Secondary | ICD-10-CM | POA: Diagnosis present

## 2020-08-01 DIAGNOSIS — Z20822 Contact with and (suspected) exposure to covid-19: Secondary | ICD-10-CM | POA: Diagnosis present

## 2020-08-01 DIAGNOSIS — Z880 Allergy status to penicillin: Secondary | ICD-10-CM

## 2020-08-01 DIAGNOSIS — Z9079 Acquired absence of other genital organ(s): Secondary | ICD-10-CM

## 2020-08-01 DIAGNOSIS — Z881 Allergy status to other antibiotic agents status: Secondary | ICD-10-CM | POA: Diagnosis not present

## 2020-08-01 DIAGNOSIS — M436 Torticollis: Secondary | ICD-10-CM | POA: Diagnosis present

## 2020-08-01 DIAGNOSIS — I2510 Atherosclerotic heart disease of native coronary artery without angina pectoris: Secondary | ICD-10-CM | POA: Diagnosis present

## 2020-08-01 DIAGNOSIS — I499 Cardiac arrhythmia, unspecified: Secondary | ICD-10-CM | POA: Diagnosis present

## 2020-08-01 DIAGNOSIS — G4733 Obstructive sleep apnea (adult) (pediatric): Secondary | ICD-10-CM | POA: Diagnosis present

## 2020-08-01 DIAGNOSIS — Z7901 Long term (current) use of anticoagulants: Secondary | ICD-10-CM | POA: Diagnosis not present

## 2020-08-01 HISTORY — PX: TRANSCAROTID ARTERY REVASCULARIZATION (TCAR): SHX6784

## 2020-08-01 HISTORY — PX: ULTRASOUND GUIDANCE FOR VASCULAR ACCESS: SHX6516

## 2020-08-01 HISTORY — PX: TRANSCAROTID ARTERY REVASCULARIZATIONÂ: SHX6778

## 2020-08-01 LAB — CBC
HCT: 39.4 % (ref 39.0–52.0)
Hemoglobin: 13.2 g/dL (ref 13.0–17.0)
MCH: 30.2 pg (ref 26.0–34.0)
MCHC: 33.5 g/dL (ref 30.0–36.0)
MCV: 90.2 fL (ref 80.0–100.0)
Platelets: 255 10*3/uL (ref 150–400)
RBC: 4.37 MIL/uL (ref 4.22–5.81)
RDW: 12.9 % (ref 11.5–15.5)
WBC: 11.5 10*3/uL — ABNORMAL HIGH (ref 4.0–10.5)
nRBC: 0 % (ref 0.0–0.2)

## 2020-08-01 LAB — POCT ACTIVATED CLOTTING TIME
Activated Clotting Time: 235 seconds
Activated Clotting Time: 285 seconds
Activated Clotting Time: 263 seconds

## 2020-08-01 LAB — CREATININE, SERUM
Creatinine, Ser: 0.96 mg/dL (ref 0.61–1.24)
GFR, Estimated: >60 mL/min (ref >60)

## 2020-08-01 SURGERY — TRANSCAROTID ARTERY REVASCULARIZATION (TCAR)
Anesthesia: General | Site: Neck | Laterality: Right

## 2020-08-01 MED ORDER — ONDANSETRON HCL 4 MG/2ML IJ SOLN
INTRAMUSCULAR | Status: DC | PRN
  Administered 2020-08-01: 4 mg via INTRAVENOUS

## 2020-08-01 MED ORDER — APIXABAN 5 MG PO TABS
5.0000 mg | ORAL_TABLET | Freq: Two times a day (BID) | ORAL | Status: DC
  Administered 2020-08-02: 5 mg via ORAL
  Filled 2020-08-01: qty 1

## 2020-08-01 MED ORDER — FENTANYL CITRATE (PF) 250 MCG/5ML IJ SOLN
INTRAMUSCULAR | Status: DC | PRN
  Administered 2020-08-01: 50 ug via INTRAVENOUS

## 2020-08-01 MED ORDER — GUAIFENESIN-DM 100-10 MG/5ML PO SYRP: 15.0000 mL | ORAL_SOLUTION | ORAL | Status: DC | PRN

## 2020-08-01 MED ORDER — LACTATED RINGERS IV SOLN: INTRAVENOUS | Status: DC | PRN

## 2020-08-01 MED ORDER — ROCURONIUM BROMIDE 10 MG/ML (PF) SYRINGE
PREFILLED_SYRINGE | INTRAVENOUS | Status: DC | PRN
  Administered 2020-08-01: 50 mg via INTRAVENOUS

## 2020-08-01 MED ORDER — PHENYLEPHRINE HCL-NACL 10-0.9 MG/250ML-% IV SOLN
INTRAVENOUS | Status: DC | PRN
  Administered 2020-08-01: 25 ug/min via INTRAVENOUS

## 2020-08-01 MED ORDER — PROPOFOL 10 MG/ML IV BOLUS
INTRAVENOUS | Status: DC | PRN
  Administered 2020-08-01: 60 mg via INTRAVENOUS
  Administered 2020-08-01: 50 mg via INTRAVENOUS

## 2020-08-01 MED ORDER — VANCOMYCIN HCL IN DEXTROSE 1-5 GM/200ML-% IV SOLN
1000.0000 mg | INTRAVENOUS | Status: AC
  Administered 2020-08-01: 1000 mg via INTRAVENOUS
  Filled 2020-08-01: qty 200

## 2020-08-01 MED ORDER — FOLIC ACID 800 MCG PO TABS: 800.0000 ug | ORAL_TABLET | Freq: Every day | ORAL | Status: DC

## 2020-08-01 MED ORDER — FERROUS SULFATE 220 (44 FE) MG/5ML PO ELIX
135.0000 mg | ORAL_SOLUTION | Freq: Every day | ORAL | Status: DC
  Administered 2020-08-02: 135 mg via ORAL
  Filled 2020-08-01: qty 3.1

## 2020-08-01 MED ORDER — FENTANYL CITRATE (PF) 250 MCG/5ML IJ SOLN
INTRAMUSCULAR | Status: AC
  Filled 2020-08-01: qty 5

## 2020-08-01 MED ORDER — SODIUM CHLORIDE 0.9 % IV SOLN: 500.0000 mL | Freq: Once | INTRAVENOUS | Status: DC | PRN

## 2020-08-01 MED ORDER — CLOPIDOGREL BISULFATE 75 MG PO TABS
75.0000 mg | ORAL_TABLET | Freq: Every day | ORAL | Status: DC
  Administered 2020-08-02: 75 mg via ORAL
  Filled 2020-08-01: qty 1

## 2020-08-01 MED ORDER — MAGNESIUM SULFATE 2 GM/50ML IV SOLN: 2.0000 g | Freq: Every day | INTRAVENOUS | Status: DC | PRN

## 2020-08-01 MED ORDER — LIDOCAINE HCL (PF) 1 % IJ SOLN
INTRAMUSCULAR | Status: AC
  Filled 2020-08-01: qty 30

## 2020-08-01 MED ORDER — HYDRALAZINE HCL 50 MG PO TABS
50.0000 mg | ORAL_TABLET | Freq: Three times a day (TID) | ORAL | Status: DC
  Administered 2020-08-01 – 2020-08-02 (×3): 50 mg via ORAL
  Filled 2020-08-01 (×3): qty 1

## 2020-08-01 MED ORDER — 0.9 % SODIUM CHLORIDE (POUR BTL) OPTIME
TOPICAL | Status: DC | PRN
  Administered 2020-08-01: 1000 mL

## 2020-08-01 MED ORDER — PANTOPRAZOLE SODIUM 40 MG PO TBEC
40.0000 mg | DELAYED_RELEASE_TABLET | Freq: Every day | ORAL | Status: DC
  Administered 2020-08-01 – 2020-08-02 (×2): 40 mg via ORAL
  Filled 2020-08-01 (×2): qty 1

## 2020-08-01 MED ORDER — SUGAMMADEX SODIUM 200 MG/2ML IV SOLN
INTRAVENOUS | Status: DC | PRN
  Administered 2020-08-01: 200 mg via INTRAVENOUS

## 2020-08-01 MED ORDER — POTASSIUM CHLORIDE CRYS ER 20 MEQ PO TBCR: 20.0000 meq | EXTENDED_RELEASE_TABLET | Freq: Every day | ORAL | Status: DC | PRN

## 2020-08-01 MED ORDER — PHENYLEPHRINE 40 MCG/ML (10ML) SYRINGE FOR IV PUSH (FOR BLOOD PRESSURE SUPPORT)
PREFILLED_SYRINGE | INTRAVENOUS | Status: DC | PRN
  Administered 2020-08-01: 40 ug via INTRAVENOUS
  Administered 2020-08-01: 80 ug via INTRAVENOUS
  Administered 2020-08-01: 40 ug via INTRAVENOUS
  Administered 2020-08-01: 80 ug via INTRAVENOUS

## 2020-08-01 MED ORDER — LIDOCAINE 2% (20 MG/ML) 5 ML SYRINGE
INTRAMUSCULAR | Status: AC
  Filled 2020-08-01: qty 5

## 2020-08-01 MED ORDER — EPHEDRINE SULFATE-NACL 50-0.9 MG/10ML-% IV SOSY
PREFILLED_SYRINGE | INTRAVENOUS | Status: DC | PRN
  Administered 2020-08-01 (×2): 10 mg via INTRAVENOUS

## 2020-08-01 MED ORDER — ASPIRIN EC 81 MG PO TBEC
81.0000 mg | DELAYED_RELEASE_TABLET | Freq: Every day | ORAL | Status: DC
  Administered 2020-08-02: 81 mg via ORAL
  Filled 2020-08-01: qty 1

## 2020-08-01 MED ORDER — SODIUM CHLORIDE 0.9 % IV SOLN: INTRAVENOUS | Status: DC

## 2020-08-01 MED ORDER — LIDOCAINE 2% (20 MG/ML) 5 ML SYRINGE
INTRAMUSCULAR | Status: DC | PRN
  Administered 2020-08-01: 100 mg via INTRAVENOUS

## 2020-08-01 MED ORDER — HEPARIN SODIUM (PORCINE) 1000 UNIT/ML IJ SOLN
INTRAMUSCULAR | Status: AC
  Filled 2020-08-01: qty 2

## 2020-08-01 MED ORDER — FERROUS SULFATE 27 MG PO TABS: 27.0000 mg | ORAL_TABLET | Freq: Every day | ORAL | Status: DC

## 2020-08-01 MED ORDER — HEPARIN SODIUM (PORCINE) 1000 UNIT/ML IJ SOLN
INTRAMUSCULAR | Status: DC | PRN
  Administered 2020-08-01: 8000 [IU] via INTRAVENOUS
  Administered 2020-08-01: 3000 [IU] via INTRAVENOUS

## 2020-08-01 MED ORDER — OXYCODONE HCL 5 MG PO TABS: 5.0000 mg | ORAL_TABLET | ORAL | Status: DC | PRN

## 2020-08-01 MED ORDER — LABETALOL HCL 5 MG/ML IV SOLN
10.0000 mg | INTRAVENOUS | Status: DC | PRN
  Filled 2020-08-01: qty 4

## 2020-08-01 MED ORDER — GLYCOPYRROLATE PF 0.2 MG/ML IJ SOSY
PREFILLED_SYRINGE | INTRAMUSCULAR | Status: AC
  Filled 2020-08-01: qty 2

## 2020-08-01 MED ORDER — MAGNESIUM OXIDE 400 (241.3 MG) MG PO TABS
400.0000 mg | ORAL_TABLET | Freq: Every day | ORAL | Status: DC
  Administered 2020-08-01 – 2020-08-02 (×2): 400 mg via ORAL
  Filled 2020-08-01 (×2): qty 1

## 2020-08-01 MED ORDER — SODIUM CHLORIDE 0.9 % IV SOLN
INTRAVENOUS | Status: DC | PRN
  Administered 2020-08-01: 08:00:00 500 mL

## 2020-08-01 MED ORDER — ONDANSETRON HCL 4 MG/2ML IJ SOLN: 4.0000 mg | Freq: Once | INTRAMUSCULAR | Status: DC | PRN

## 2020-08-01 MED ORDER — CHLORHEXIDINE GLUCONATE CLOTH 2 % EX PADS: 6.0000 | MEDICATED_PAD | Freq: Once | CUTANEOUS | Status: DC

## 2020-08-01 MED ORDER — HEPARIN SODIUM (PORCINE) 1000 UNIT/ML IJ SOLN
INTRAMUSCULAR | Status: AC
  Filled 2020-08-01: qty 1

## 2020-08-01 MED ORDER — GLYCOPYRROLATE 0.2 MG/ML IJ SOLN
INTRAMUSCULAR | Status: DC | PRN
  Administered 2020-08-01 (×2): .2 mg via INTRAVENOUS

## 2020-08-01 MED ORDER — ACETAMINOPHEN 650 MG RE SUPP: 325.0000 mg | RECTAL | Status: DC | PRN

## 2020-08-01 MED ORDER — MAGNESIUM 250 MG PO TABS: 500.0000 mg | ORAL_TABLET | Freq: Every day | ORAL | Status: DC

## 2020-08-01 MED ORDER — ROCURONIUM BROMIDE 10 MG/ML (PF) SYRINGE
PREFILLED_SYRINGE | INTRAVENOUS | Status: AC
  Filled 2020-08-01: qty 10

## 2020-08-01 MED ORDER — HYDRALAZINE HCL 20 MG/ML IJ SOLN
INTRAMUSCULAR | Status: AC
  Filled 2020-08-01: qty 1

## 2020-08-01 MED ORDER — ACEBUTOLOL HCL 200 MG PO CAPS
200.0000 mg | ORAL_CAPSULE | Freq: Two times a day (BID) | ORAL | Status: DC
  Administered 2020-08-01 – 2020-08-02 (×2): 200 mg via ORAL
  Filled 2020-08-01 (×2): qty 1

## 2020-08-01 MED ORDER — HYDROMORPHONE HCL 1 MG/ML IJ SOLN: 0.5000 mg | INTRAMUSCULAR | Status: DC | PRN

## 2020-08-01 MED ORDER — IODIXANOL 320 MG/ML IV SOLN
INTRAVENOUS | Status: DC | PRN
  Administered 2020-08-01: 65 mL via INTRA_ARTERIAL

## 2020-08-01 MED ORDER — ONDANSETRON HCL 4 MG/2ML IJ SOLN: 4.0000 mg | Freq: Four times a day (QID) | INTRAMUSCULAR | Status: DC | PRN

## 2020-08-01 MED ORDER — ALUM & MAG HYDROXIDE-SIMETH 200-200-20 MG/5ML PO SUSP: 15.0000 mL | ORAL | Status: DC | PRN

## 2020-08-01 MED ORDER — CHLORHEXIDINE GLUCONATE 0.12 % MT SOLN
OROMUCOSAL | Status: AC
  Administered 2020-08-01: 15 mL
  Filled 2020-08-01: qty 15

## 2020-08-01 MED ORDER — PHENOL 1.4 % MT LIQD
1.0000 | OROMUCOSAL | Status: DC | PRN
  Administered 2020-08-01: 1 via OROMUCOSAL
  Filled 2020-08-01: qty 177

## 2020-08-01 MED ORDER — PROTAMINE SULFATE 10 MG/ML IV SOLN
INTRAVENOUS | Status: DC | PRN
  Administered 2020-08-01: 40 mg via INTRAVENOUS
  Administered 2020-08-01: 10 mg via INTRAVENOUS

## 2020-08-01 MED ORDER — PHENYLEPHRINE 40 MCG/ML (10ML) SYRINGE FOR IV PUSH (FOR BLOOD PRESSURE SUPPORT)
PREFILLED_SYRINGE | INTRAVENOUS | Status: AC
  Filled 2020-08-01: qty 10

## 2020-08-01 MED ORDER — DOCUSATE SODIUM 100 MG PO CAPS
100.0000 mg | ORAL_CAPSULE | Freq: Every day | ORAL | Status: DC
  Administered 2020-08-02: 100 mg via ORAL
  Filled 2020-08-01: qty 1

## 2020-08-01 MED ORDER — L-ARGININE 1000 MG PO TABS: 1000.0000 mg | ORAL_TABLET | Freq: Every day | ORAL | Status: DC

## 2020-08-01 MED ORDER — PROTAMINE SULFATE 10 MG/ML IV SOLN
INTRAVENOUS | Status: AC
  Filled 2020-08-01: qty 5

## 2020-08-01 MED ORDER — EPHEDRINE 5 MG/ML INJ
INTRAVENOUS | Status: AC
  Filled 2020-08-01: qty 10

## 2020-08-01 MED ORDER — ACETAMINOPHEN 325 MG PO TABS
325.0000 mg | ORAL_TABLET | ORAL | Status: DC | PRN
  Administered 2020-08-01: 325 mg via ORAL
  Administered 2020-08-02: 650 mg via ORAL
  Filled 2020-08-01 (×2): qty 2

## 2020-08-01 MED ORDER — ALPRAZOLAM 0.25 MG PO TABS
0.2500 mg | ORAL_TABLET | Freq: Every evening | ORAL | Status: DC | PRN
  Administered 2020-08-02: 0.25 mg via ORAL
  Filled 2020-08-01: qty 1

## 2020-08-01 MED ORDER — FOLIC ACID 1 MG PO TABS
1.0000 mg | ORAL_TABLET | Freq: Every day | ORAL | Status: DC
  Administered 2020-08-01 – 2020-08-02 (×2): 1 mg via ORAL
  Filled 2020-08-01 (×2): qty 1

## 2020-08-01 MED ORDER — GLYCOPYRROLATE PF 0.2 MG/ML IJ SOSY
PREFILLED_SYRINGE | INTRAMUSCULAR | Status: AC
  Filled 2020-08-01: qty 1

## 2020-08-01 MED ORDER — ROSUVASTATIN CALCIUM 5 MG PO TABS
10.0000 mg | ORAL_TABLET | Freq: Every day | ORAL | Status: DC
  Administered 2020-08-02: 10 mg via ORAL
  Filled 2020-08-01: qty 2

## 2020-08-01 MED ORDER — HYDRALAZINE HCL 20 MG/ML IJ SOLN
5.0000 mg | INTRAMUSCULAR | Status: DC | PRN
  Administered 2020-08-01: 5 mg via INTRAVENOUS
  Filled 2020-08-01 (×2): qty 0.25

## 2020-08-01 MED ORDER — FENTANYL CITRATE (PF) 100 MCG/2ML IJ SOLN: 25.0000 ug | INTRAMUSCULAR | Status: DC | PRN

## 2020-08-01 MED ORDER — SENNOSIDES-DOCUSATE SODIUM 8.6-50 MG PO TABS: 1.0000 | ORAL_TABLET | Freq: Every evening | ORAL | Status: DC | PRN

## 2020-08-01 MED ORDER — IRBESARTAN 150 MG PO TABS
150.0000 mg | ORAL_TABLET | Freq: Every day | ORAL | Status: DC
  Administered 2020-08-02: 150 mg via ORAL
  Filled 2020-08-01: qty 1

## 2020-08-01 MED ORDER — ONDANSETRON HCL 4 MG/2ML IJ SOLN
INTRAMUSCULAR | Status: AC
  Filled 2020-08-01: qty 2

## 2020-08-01 MED ORDER — BISACODYL 5 MG PO TBEC: 5.0000 mg | DELAYED_RELEASE_TABLET | Freq: Every day | ORAL | Status: DC | PRN

## 2020-08-01 MED ORDER — SODIUM CHLORIDE 0.9 % IV SOLN
INTRAVENOUS | Status: AC
  Filled 2020-08-01: qty 1.2

## 2020-08-01 MED ORDER — VANCOMYCIN HCL IN DEXTROSE 1-5 GM/200ML-% IV SOLN
1000.0000 mg | Freq: Two times a day (BID) | INTRAVENOUS | Status: AC
  Administered 2020-08-01 – 2020-08-02 (×2): 1000 mg via INTRAVENOUS
  Filled 2020-08-01 (×2): qty 200

## 2020-08-01 MED ORDER — DEXAMETHASONE SODIUM PHOSPHATE 10 MG/ML IJ SOLN
INTRAMUSCULAR | Status: AC
  Filled 2020-08-01: qty 1

## 2020-08-01 MED ORDER — DEXAMETHASONE SODIUM PHOSPHATE 10 MG/ML IJ SOLN
INTRAMUSCULAR | Status: DC | PRN
  Administered 2020-08-01: 5 mg via INTRAVENOUS

## 2020-08-01 MED ORDER — METOPROLOL TARTRATE 5 MG/5ML IV SOLN: 2.0000 mg | INTRAVENOUS | Status: DC | PRN

## 2020-08-01 MED ORDER — PROPOFOL 10 MG/ML IV BOLUS
INTRAVENOUS | Status: AC
  Filled 2020-08-01: qty 20

## 2020-08-01 SURGICAL SUPPLY — 61 items
BAG BANDED W/RUBBER/TAPE 36X54 (MISCELLANEOUS) ×3 IMPLANT
BALLN STERLING RX 5X20X80 (BALLOONS) ×3
BALLOON STERLING RX 5X20X80 (BALLOONS) ×2 IMPLANT
CANISTER SUCT 3000ML PPV (MISCELLANEOUS) ×3 IMPLANT
CANNULA VESSEL 3MM 2 BLNT TIP (CANNULA) ×3 IMPLANT
CATH BEACON 5 .035 40 KMP TP (CATHETERS) ×2 IMPLANT
CATH BEACON 5 .038 40 KMP TP (CATHETERS) ×3
CATH ROBINSON RED A/P 18FR (CATHETERS) IMPLANT
CLIP VESOCCLUDE MED 6/CT (CLIP) ×3 IMPLANT
CLIP VESOCCLUDE SM WIDE 6/CT (CLIP) ×3 IMPLANT
COVER DOME SNAP 22 D (MISCELLANEOUS) ×3 IMPLANT
COVER PROBE W GEL 5X96 (DRAPES) ×3 IMPLANT
DECANTER SPIKE VIAL GLASS SM (MISCELLANEOUS) IMPLANT
DERMABOND ADVANCED (GAUZE/BANDAGES/DRESSINGS) ×2
DERMABOND ADVANCED .7 DNX12 (GAUZE/BANDAGES/DRESSINGS) ×4 IMPLANT
DRAIN HEMOVAC 1/8 X 5 (WOUND CARE) IMPLANT
DRAPE FEMORAL ANGIO 80X135IN (DRAPES) ×3 IMPLANT
ELECT REM PT RETURN 9FT ADLT (ELECTROSURGICAL) ×3
ELECTRODE REM PT RTRN 9FT ADLT (ELECTROSURGICAL) ×2 IMPLANT
EVACUATOR SILICONE 100CC (DRAIN) IMPLANT
GLOVE BIO SURGEON STRL SZ7.5 (GLOVE) ×3 IMPLANT
GOWN STRL REUS W/ TWL LRG LVL3 (GOWN DISPOSABLE) ×6 IMPLANT
GOWN STRL REUS W/TWL LRG LVL3 (GOWN DISPOSABLE) ×9
HEMOSTAT SPONGE AVITENE ULTRA (HEMOSTASIS) IMPLANT
INTRODUCER KIT GALT 7CM (INTRODUCER) ×3
KIT BASIN OR (CUSTOM PROCEDURE TRAY) ×3 IMPLANT
KIT ENCORE 26 ADVANTAGE (KITS) ×3 IMPLANT
KIT INTRODUCER GALT 7 (INTRODUCER) ×2 IMPLANT
KIT TURNOVER KIT B (KITS) ×3 IMPLANT
NEEDLE HYPO 25GX1X1/2 BEV (NEEDLE) IMPLANT
NEEDLE PERC 18GX7CM (NEEDLE) ×3 IMPLANT
NS IRRIG 1000ML POUR BTL (IV SOLUTION) ×3 IMPLANT
PACK CAROTID (CUSTOM PROCEDURE TRAY) ×3 IMPLANT
POSITIONER HEAD DONUT 9IN (MISCELLANEOUS) ×3 IMPLANT
PROTECTION STATION PRESSURIZED (MISCELLANEOUS)
SET MICROPUNCTURE 5F STIFF (MISCELLANEOUS) ×3 IMPLANT
SHEATH AVANTI 11CM 5FR (SHEATH) IMPLANT
SHUNT CAROTID BYPASS 10 (VASCULAR PRODUCTS) IMPLANT
SHUNT CAROTID BYPASS 12FRX15.5 (VASCULAR PRODUCTS) IMPLANT
STATION PROTECTION PRESSURIZED (MISCELLANEOUS) IMPLANT
STENT TRANSCAROTID SYS 7X40 (Permanent Stent) ×3 IMPLANT
STOPCOCK MORSE 400PSI 3WAY (MISCELLANEOUS) ×3 IMPLANT
SUT ETHILON 3 0 PS 1 (SUTURE) IMPLANT
SUT PROLENE 6 0 CC (SUTURE) ×3 IMPLANT
SUT SILK 2 0 PERMA HAND 18 BK (SUTURE) ×3 IMPLANT
SUT SILK 2 0SH CR/8 30 (SUTURE) ×3 IMPLANT
SUT SILK 3 0 TIES 17X18 (SUTURE)
SUT SILK 3-0 18XBRD TIE BLK (SUTURE) IMPLANT
SUT VIC AB 3-0 SH 27 (SUTURE) ×3
SUT VIC AB 3-0 SH 27X BRD (SUTURE) ×2 IMPLANT
SUT VIC AB 4-0 PS2 18 (SUTURE) ×3 IMPLANT
SUT VICRYL 4-0 PS2 18IN ABS (SUTURE) ×3 IMPLANT
SYR 10ML LL (SYRINGE) ×9 IMPLANT
SYR 20ML LL LF (SYRINGE) ×3 IMPLANT
SYR 5ML LL (SYRINGE) ×3 IMPLANT
SYR CONTROL 10ML LL (SYRINGE) IMPLANT
TOWEL GREEN STERILE (TOWEL DISPOSABLE) ×3 IMPLANT
TUBING EXTENTION W/L.L. (IV SETS) ×3 IMPLANT
WATER STERILE IRR 1000ML POUR (IV SOLUTION) ×3 IMPLANT
WIRE BENTSON .035X145CM (WIRE) ×3 IMPLANT
WIRE SPARTACORE .014X190CM (WIRE) ×9 IMPLANT

## 2020-08-01 NOTE — Op Note (Signed)
Procedure: Left transcarotid revascularization (TCAR), stent left internal carotid artery (7 x 40)  Preoperative diagnosis: Left internal carotid artery stenosis greater than 80%  Postoperative diagnosis: Same  Anesthesia: General  Assistant: Gerri Lins, PA-C to expedite procedure and with manipulation of wires  Operative findings: Moderately calcified left internal carotid artery origin stented to 0% residual stenosis 7 x 40 self-expanding stent  Operative details: After obtaining form consent, the patient taken the operating.  The patient was placed in supine position operating table.  After induction general esthesia endotracheal ovation patient's left neck and chest and groins were prepped and draped in usual sterile fashion.  Ultrasound was used to identify the right common femoral vein.  Introducer needle was used to cannulate the right common femoral vein on 035 Bentson wire threaded up into the abdominal portion of the vena cava.  The venous sheath for the TCAR system was then placed over the guidewire in the right femoral vein.  This was noted to flush and draw easily.  Attention was then turned to the patient's left neck.  Medial and lateral heads of the sternocleidomastoid muscle were identified.  The patient had a fairly stiff neck so it was difficult to rotate or extend the patient's neck.  This made dissection a little more tedious.  A transverse incision was made just above the clavicle muscle carried on through subcutaneous tissues down through the platysma.  Incision was deepened through the heads of the sternocleidomastoid muscle and the internal jugular vein identified and reflected laterally.  Several small side branches were ligated divided to silk ties.  Vagus nerve was identified and protected.  Common carotid artery was dissected free circumferentially at the base of the incision.  A vessel loop was placed around this.  It was thick on palpation but overall no calcium.  A 6-0  Prolene suture was used to place a pursestring stitch in the common carotid artery.  A micropuncture needle was then introduced into the common carotid artery and a contrast angiogram obtained in a 45 degree LAO projection to open up the carotid bifurcation.  The patient was given 8000 units of heparin.  He was given an additional 3000 units of heparin to achieve an ACT greater than 250.  This showed a greater than 80% stenosis with moderate calcification at the origin of the left internal carotid artery.  At this point the carotid Amplatz wire was advanced through the sheath.  This was fairly difficult due to a steep angle because of the patient's neck position.  I was able to gradually push this into the artery with no significant resistance.  While I advanced the sheath forward we held traction on the common carotid artery with a vessel loop.  Contrast angiogram was obtained in 2 views to make sure we had no area of dissection.  We then made sure the patient's mean arterial pressure was maintained between 95 and 100.  His heart rate was a little bit low briefly in the 30s but we were able to get this back up into the low 50s high 40s.  The filtration system was connected from the carotid sheath to the venous sheath and we confirmed reversal of flow on high flow.  The common carotid artery was then controlled with the vessel loop.  At this point the 014 wire and balloon were placed through the sheath and I tried to direct the guidewire across the lesion but was unsuccessful with this.  We then brought up a 45 cm  KMP catheter to direct the wire towards the origin of the internal carotid artery.  Even with magnified views I was unable to get the wire to advance across the lesion although I was able to get it to engage at the origin.  We then switched the wire out for a 014 [place] core wire.  Using this wire I was able to advance this into the origin of the internal carotid artery and while leaving this in place a  buddy wire an additional 014 [place] core wire and cross the lesion fairly smoothly.  Distal wire was confirmed to be in the petrous portion of the carotid artery.  A 5 x 20 angioplasty balloon was then brought up and centered on the lesion and inflated to 6 atm nominal pressure for approximately 1 minute.  There was minimal heart rate change.  The balloon was then removed over the wire and replaced with a 7 x 40 self-expanding stent.  This was advanced and centered on the lesion and deployed wheeze and a pinch pull technique.  After a few minutes contrast angiogram was performed which showed full deployment of the stent.  This was confirmed in 2 views.  Also confirmed again that there was no sheath dissection area.  Guidewire was then removed.  The filtration system was removed and the blood return to the venous sheath.  This was then disconnected.  Common carotid again was controlled with a vessel loop and the sheath removed and the pursestring secured down to achieve hemostasis.  Patient's ACT was still greater than 250 so we gave 50 mg of protamine.  The venous sheath was removed and hemostasis obtained with direct pressure.  Left neck common carotid was inspected with Doppler and found to have good flow above and below the area of the arteriotomy.  The platysma muscle was reapproximated using a running 3-0 Vicryl suture.  The skin was closed with a 4-0 Vicryl subcuticular stitch.  Patient tolerated procedure well and there were no complications.  Instrument sponge and needle counts correct in the case.  Patient was taken the recovery room in stable condition.  He was moving upper extremity lower extremity symmetrically and following commands on arrival in the recovery room.  Ruta Hinds, MD [facility] [place] Office: [phone]

## 2020-08-01 NOTE — Interval H&P Note (Signed)
History and Physical Interval Note:  08/01/2020 7:22 AM  Dalton Cardenas  has presented today for surgery, with the diagnosis of LEFT CAROTID ARTERY STENOSIS.  The various methods of treatment have been discussed with the patient and family. After consideration of risks, benefits and other options for treatment, the patient has consented to  Procedure(s): LEFT TRANSCAROTID ARTERY REVASCULARIZATION (Left) as a surgical intervention.  The patient's history has been reviewed, patient examined, no change in status, stable for surgery.  I have reviewed the patient's chart and labs.  Questions were answered to the patient's satisfaction.     Ruta Hinds

## 2020-08-01 NOTE — Progress Notes (Signed)
   Speech clear, no tongue deviation, no facial droop Neck incision with minimal edema, soft without frank hematoma Moving all 4 ext.  S/P TCAR Stable disposition  Roxy Horseman PA-C

## 2020-08-01 NOTE — Transfer of Care (Signed)
Immediate Anesthesia Transfer of Care Note  Patient: Azazel D Bienvenue  Procedure(s) Performed: LEFT TRANSCAROTID ARTERY REVASCULARIZATION (Left Neck) ULTRASOUND GUIDANCE FOR VASCULAR ACCESS (Right Groin)  Patient Location: PACU  Anesthesia Type:General  Level of Consciousness: drowsy and patient cooperative  Airway & Oxygen Therapy: Patient Spontanous Breathing  Post-op Assessment: Report given to RN and Post -op Vital signs reviewed and stable  Post vital signs: Reviewed and stable  Last Vitals:  Vitals Value Taken Time  BP 107/47   Temp    Pulse 52 08/01/20 1101  Resp 19 08/01/20 1101  SpO2 98 % 08/01/20 1101  Vitals shown include unvalidated device data.  Last Pain:  Vitals:   08/01/20 0636  TempSrc:   PainSc: 0-No pain         Complications: No complications documented.

## 2020-08-01 NOTE — Anesthesia Procedure Notes (Signed)
Procedure Name: Intubation Date/Time: 08/01/2020 8:18 AM Performed by: Kathryne Hitch, CRNA Pre-anesthesia Checklist: Patient identified, Emergency Drugs available, Suction available and Patient being monitored Patient Re-evaluated:Patient Re-evaluated prior to induction Oxygen Delivery Method: Circle system utilized Preoxygenation: Pre-oxygenation with 100% oxygen Induction Type: IV induction Ventilation: Mask ventilation without difficulty Laryngoscope Size: Glidescope and 4 Grade View: Grade I Tube type: Oral Tube size: 7.5 mm Number of attempts: 2 Airway Equipment and Method: Stylet and Oral airway Placement Confirmation: ETT inserted through vocal cords under direct vision,  positive ETCO2 and breath sounds checked- equal and bilateral Secured at: 23 cm Tube secured with: Tape Dental Injury: Teeth and Oropharynx as per pre-operative assessment

## 2020-08-01 NOTE — Anesthesia Procedure Notes (Addendum)
Arterial Line Insertion Start/End11/15/2021 8:18 AM, 08/01/2020 8:18 AM Performed by: Nolon Nations, MD, Kathryne Hitch, CRNA, anesthesiologist  Patient location: Pre-op. Preanesthetic checklist: patient identified, IV checked, site marked, risks and benefits discussed, surgical consent, monitors and equipment checked, pre-op evaluation, timeout performed and anesthesia consent Lidocaine 1% used for infiltration Left, radial was placed Catheter size: 20 Fr Hand hygiene performed  and maximum sterile barriers used   Attempts: 4 Procedure performed using ultrasound guided technique. Ultrasound Notes:anatomy identified, needle tip was noted to be adjacent to the nerve/plexus identified and no ultrasound evidence of intravascular and/or intraneural injection Following insertion, dressing applied and Biopatch. Post procedure assessment: normal and unchanged  Patient tolerated the procedure well with no immediate complications.

## 2020-08-02 ENCOUNTER — Encounter (HOSPITAL_COMMUNITY): Payer: Self-pay | Admitting: Vascular Surgery

## 2020-08-02 LAB — BASIC METABOLIC PANEL
Anion gap: 6 (ref 5–15)
BUN: 13 mg/dL (ref 8–23)
CO2: 22 mmol/L (ref 22–32)
Calcium: 7.7 mg/dL — ABNORMAL LOW (ref 8.9–10.3)
Chloride: 104 mmol/L (ref 98–111)
Creatinine, Ser: 1.02 mg/dL (ref 0.61–1.24)
GFR, Estimated: >60 mL/min (ref >60)
Glucose, Bld: 98 mg/dL (ref 70–99)
Potassium: 4.3 mmol/L (ref 3.5–5.1)
Sodium: 132 mmol/L — ABNORMAL LOW (ref 135–145)

## 2020-08-02 LAB — CBC
HCT: 30.1 % — ABNORMAL LOW (ref 39.0–52.0)
Hemoglobin: 10.2 g/dL — ABNORMAL LOW (ref 13.0–17.0)
MCH: 30.8 pg (ref 26.0–34.0)
MCHC: 33.9 g/dL (ref 30.0–36.0)
MCV: 90.9 fL (ref 80.0–100.0)
Platelets: 220 10*3/uL (ref 150–400)
RBC: 3.31 MIL/uL — ABNORMAL LOW (ref 4.22–5.81)
RDW: 13 % (ref 11.5–15.5)
WBC: 9 10*3/uL (ref 4.0–10.5)
nRBC: 0 % (ref 0.0–0.2)

## 2020-08-02 LAB — ABO/RH: ABO/RH(D): AB POS

## 2020-08-02 MED ORDER — OXYCODONE HCL 5 MG PO TABS: 5.0000 mg | ORAL_TABLET | Freq: Four times a day (QID) | ORAL | 0 refills | Status: DC | PRN

## 2020-08-02 NOTE — Discharge Instructions (Signed)
[facility]  Discharge Instructions   Carotid Endarterectomy (CEA)  Please refer to the following instructions for your post-procedure care. Your surgeon or physician assistant will discuss any changes with you.  Activity  You are encouraged to walk as much as you can. You can slowly return to normal activities but must avoid strenuous activity and heavy lifting until your doctor tell you it's OK. Avoid activities such as vacuuming or swinging a golf club. You can drive after one week if you are comfortable and you are no longer taking prescription pain medications. It is normal to feel tired for serval weeks after your surgery. It is also normal to have difficulty with sleep habits, eating, and bowel movements after surgery. These will go away with time.  Bathing/Showering  You may shower after you come home. Do not soak in a bathtub, hot tub, or swim until the incision heals completely.  Incision Care  Shower every day. Clean your incision with mild soap and water. Pat the area dry with a clean towel. You do not need a bandage unless otherwise instructed. Do not apply any ointments or creams to your incision. You may have skin glue on your incision. Do not peel it off. It will come off on its own in about one week. Your incision may feel thickened and raised for several weeks after your surgery. This is normal and the skin will soften over time. For Men Only: It's OK to shave around the incision but do not shave the incision itself for 2 weeks. It is common to have numbness under your chin that could last for several months.  Diet  Resume your normal diet. There are no special food restrictions following this procedure. A low fat/low cholesterol diet is recommended for all patients with vascular disease. In order to heal from your surgery, it is CRITICAL to get adequate nutrition. Your body requires vitamins, minerals, and protein. Vegetables are the best  source of vitamins and minerals. Vegetables also provide the perfect balance of protein. Processed food has little nutritional value, so try to avoid this.        Medications  Resume taking all of your medications unless your doctor or physician assistant tells you not to. If your incision is causing pain, you may take over-the- counter pain relievers such as acetaminophen (Tylenol). If you were prescribed a stronger pain medication, please be aware these medications can cause nausea and constipation. Prevent nausea by taking the medication with a snack or meal. Avoid constipation by drinking plenty of fluids and eating foods with a high amount of fiber, such as fruits, vegetables, and grains. Do not take Tylenol if you are taking prescription pain medications.  Follow Up  Our office will schedule a follow up appointment 2-3 weeks following discharge.  Please call us immediately for any of the following conditions  Increased pain, redness, drainage (pus) from your incision site. Fever of 101 degrees or higher. If you should develop stroke (slurred speech, difficulty swallowing, weakness on one side of your body, loss of vision) you should call 911 and go to the nearest emergency room.  Reduce your risk of vascular disease:  Stop smoking. If you would like help call [facility] at 1-800-QUIT-NOW 321-656-5797) or [place] at (201)854-8673. Manage your cholesterol Maintain a desired weight Control your diabetes Keep your blood pressure down  If you have any questions, please call the office at [phone].  Information on my medicine - ELIQUIS (apixaban)  This medication education was reviewed with me or my healthcare representative as part of my discharge preparation.    Why was Eliquis prescribed for you? Eliquis was prescribed for you to reduce the risk of a blood clot forming that can cause a stroke if you have a medical condition called atrial fibrillation (a type of  irregular heartbeat).  What do You need to know about Eliquis ? Take your Eliquis TWICE DAILY - one tablet in the morning and one tablet in the evening with or without food. If you have difficulty swallowing the tablet whole please discuss with your pharmacist how to take the medication safely.  Take Eliquis exactly as prescribed by your doctor and DO NOT stop taking Eliquis without talking to the doctor who prescribed the medication.  Stopping may increase your risk of developing a stroke.  Refill your prescription before you run out.  After discharge, you should have regular check-up appointments with your healthcare provider that is prescribing your Eliquis.  In the future your dose may need to be changed if your kidney function or weight changes by a significant amount or as you get older.  What do you do if you miss a dose? If you miss a dose, take it as soon as you remember on the same day and resume taking twice daily.  Do not take more than one dose of ELIQUIS at the same time to make up a missed dose.  Important Safety Information A possible side effect of Eliquis is bleeding. You should call your healthcare provider right away if you experience any of the following: ? Bleeding from an injury or your nose that does not stop. ? Unusual colored urine (red or dark brown) or unusual colored stools (red or black). ? Unusual bruising for unknown reasons. ? A serious fall or if you hit your head (even if there is no bleeding).  Some medicines may interact with Eliquis and might increase your risk of bleeding or clotting while on Eliquis. To help avoid this, consult your healthcare provider or pharmacist prior to using any new prescription or non-prescription medications, including herbals, vitamins, non-steroidal anti-inflammatory drugs (NSAIDs) and supplements.  This website has more information on Eliquis (apixaban): http://www.eliquis.com/eliquis/home

## 2020-08-02 NOTE — Progress Notes (Addendum)
  Progress Note    08/02/2020 7:30 AM 1 Day Post-Op  Subjective:  Had a mild headache overnight but this was relieved with tylenol.  Denies stroke like symptoms including slurring speech, changes in vision, or one sided weakness.   Vitals:   08/01/20 2357 08/02/20 0325  BP: (!) 139/58 118/69  Pulse: 60 (!) 56  Resp: 16 16  Temp: 97.7 F (36.5 C) 97.9 F (36.6 C)  SpO2: 99% 99%   Physical Exam: Lungs:  Non labored Incisions:  L neck some local edema but soft; R groin venous cath site without hematoma  Extremities:  Moving all extremities well Neurologic: CN grossly intact  CBC    Component Value Date/Time   WBC 9.0 08/02/2020 0500   RBC 3.31 (L) 08/02/2020 0500   HGB 10.2 (L) 08/02/2020 0500   HCT 30.1 (L) 08/02/2020 0500   PLT 220 08/02/2020 0500   MCV 90.9 08/02/2020 0500   MCH 30.8 08/02/2020 0500   MCHC 33.9 08/02/2020 0500   RDW 13.0 08/02/2020 0500   LYMPHSABS 2.4 10/07/2014 0947   MONOABS 0.5 10/07/2014 0947   EOSABS 0.4 10/07/2014 0947   BASOSABS 0.1 10/07/2014 0947    BMET    Component Value Date/Time   NA 132 (L) 08/02/2020 0500   NA 133 (L) 06/10/2020 1637   K 4.3 08/02/2020 0500   CL 104 08/02/2020 0500   CO2 22 08/02/2020 0500   GLUCOSE 98 08/02/2020 0500   BUN 13 08/02/2020 0500   BUN 18 06/10/2020 1637   CREATININE 1.02 08/02/2020 0500   CALCIUM 7.7 (L) 08/02/2020 0500   GFRNONAA >60 08/02/2020 0500   GFRAA 73 06/10/2020 1637    INR    Component Value Date/Time   INR 1.1 07/26/2020 1513     Intake/Output Summary (Last 24 hours) at 08/02/2020 0730 Last data filed at 08/02/2020 0530 Gross per 24 hour  Intake 3654.86 ml  Output 1800 ml  Net 1854.86 ml     Assessment/Plan:  86 y.o. male is s/p L TCAR 1 Day Post-Op   Neuro exam remains at baseline [facility] for discharge home this morning Carotid duplex in 4 weeks   Dagoberto Ligas, PA-C [facility] [phone] 08/02/2020 7:30 AM  Agree with above.  Neck  incision clean mild ecchymosis Neuro symmetric UE LE motor 5/5 Plavix ASA statin Eliquis D/c home  Ruta Hinds, MD [facility] [place] Office: [phone]

## 2020-08-02 NOTE — Progress Notes (Signed)
PHARMACIST - PHYSICIAN ORDER COMMUNICATION  CONCERNING: P&T Medication Policy on Herbal Medications  DESCRIPTION:  This patient's order for:  L-arginine  has been noted.  This product(s) is classified as an "herbal" or natural product. Due to a lack of definitive safety studies or FDA approval, nonstandard manufacturing practices, plus the potential risk of unknown drug-drug interactions while on inpatient medications, the Pharmacy and Therapeutics Committee does not permit the use of "herbal" or natural products of this type within [facility].   ACTION TAKEN: The pharmacy department is unable to verify this order at this time and your patient has been informed of this safety policy. Please reevaluate patient's clinical condition at discharge and address if the herbal or natural product(s) should be resumed at that time.   Gwendoline Judy A. Levada Dy, PharmD, BCPS, FNKF Clinical Pharmacist [facility] Please utilize Amion for appropriate phone number to reach the unit pharmacist ([place])

## 2020-08-02 NOTE — Progress Notes (Signed)
Discharge instructions given to patient. IV removed, clean and intact. Medications and wound care reviewed. All questions answered. Pt escorted home by wife.  Arletta Bale, RN

## 2020-08-02 NOTE — Anesthesia Postprocedure Evaluation (Signed)
Anesthesia Post Note  Patient: Dalton Cardenas  Procedure(s) Performed: LEFT TRANSCAROTID ARTERY REVASCULARIZATION (Left Neck) ULTRASOUND GUIDANCE FOR VASCULAR ACCESS (Right Groin)     Patient location during evaluation: PACU Anesthesia Type: General Level of consciousness: sedated and patient cooperative Pain management: pain level controlled Vital Signs Assessment: post-procedure vital signs reviewed and stable Respiratory status: spontaneous breathing Cardiovascular status: stable Anesthetic complications: no   No complications documented.  Last Vitals:  Vitals:   08/02/20 0325 08/02/20 0831  BP: 118/69 (!) 129/48  Pulse: (!) 56 (!) 54  Resp: 16 18  Temp: 36.6 C (!) 36.4 C  SpO2: 99% 97%    Last Pain:  Vitals:   08/02/20 0900  TempSrc:   PainSc: 0-No pain                 Nolon Nations

## 2020-08-03 NOTE — Discharge Summary (Signed)
Discharge Summary     Dalton Cardenas [birth year] 86 y.o. male  [id number]  Admission Date: 08/01/2020  Discharge Date: 08/02/20  Physician: Dr. Oneida Alar  Admission Diagnosis: Carotid stenosis [I65.29]  Discharge Day services:   See progress note 08/02/2020  Hospital Course:  The patient was admitted to the hospital and taken to the operating room on 08/01/2020 and underwent right TCAR by Dr. Oneida Alar.  He tolerated the procedure well and was admitted to the hospital postoperatively.  POD 1, the pt neuro status remained at baseline.  He was prescribed 1 to 2 days of narcotic pain medication for continued postoperative pain control.  He will follow-up in office in 4 weeks with a carotid duplex.  He was discharged home in stable condition.   Recent Labs    08/02/20 0500  NA 132*  K 4.3  CL 104  CO2 22  GLUCOSE 98  BUN 13  CALCIUM 7.7*   Recent Labs    08/01/20 1419 08/02/20 0500  WBC 11.5* 9.0  HGB 13.2 10.2*  HCT 39.4 30.1*  PLT 255 220   No results for input(s): INR in the last 72 hours.     Discharge Diagnosis:  Carotid stenosis [I65.29]  Secondary Diagnosis: Patient Active Problem List   Diagnosis Date Noted  . Carotid stenosis 08/01/2020  . Hyponatremia 06/30/2020  . AKI (acute kidney injury) ([place]) 06/30/2020  . Atrial flutter ([place]) 06/30/2020  . Chronic cough 11/26/2019  . SVT (supraventricular tachycardia) ([place]) 10/13/2014  . Syncope 07/22/2012  . Coronary artery disease   . CHEST PAIN UNSPECIFIED 04/13/2010  . ADENOCARCINOMA, PROSTATE 12/12/2009  . HYPERCHOLESTEROLEMIA 12/12/2009  . Obstructive sleep apnea 12/12/2009  . Essential hypertension, benign 12/12/2009  . Supraventricular tachycardia 12/12/2009   Past Medical History:  Diagnosis Date  . Coronary artery disease    60-70% circumflex (cath in [place] 08/2010).  Marland Kitchen Dysrhythmia    A history of A flutter and tachycardia (SVT)  . GERD (gastroesophageal reflux disease)   .  Hypercholesterolemia   . Hypertension   . Prostate cancer [facility])    status post prostatectomy  . Sleep apnea   . 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 SVT (supraventricular tachycardia) ([place])    a. Holter monitoring 2011 - AV node reentry, multifocal atrial tachycardia, and resting bradycardia. b. previously tx with [facility] in [place].    Allergies as of 08/02/2020      Reactions   Amoxicillin Diarrhea   GI upset: Bleeding diarrhea  Other reaction(s): GI Bleed   Ibuprofen Diarrhea   Bloody diarrhea   Penicillins Diarrhea   Bloody diarrhea   Ace Inhibitors Swelling   Face swelling   Amlodipine Swelling   Spironolactone    Hyponatremia    Atorvastatin    Weakness   Azithromycin    Stomach ache   Diltiazem Nausea Only   Upset stomach per patient   Hydrochlorothiazide Other (See Comments)   Photosensitivity      Medication List    TAKE these medications   acebutolol 200 MG capsule Commonly known as: SECTRAL Take 1 capsule (200 mg total) by mouth 2 (two) times daily.   ALIGN PO Take 1 capsule by mouth daily. Notes to patient: Take as you were at home.   ALPRAZolam 0.25 MG tablet Commonly known as: Xanax Take 1 tablet (0.25 mg total) by mouth at bedtime as needed for sleep.   aspirin EC 81 MG  tablet Take 1 tablet (81 mg total) by mouth daily.   b complex vitamins tablet Take 1 tablet by mouth daily. Notes to patient: Take as you were at home.   B-12 5000 MCG Caps Take 5,000 mcg by mouth daily. Notes to patient: Take as you were at home.   Chromium 1000 MCG Tabs Take 1,000 mcg by mouth daily. Notes to patient: Take as you were at home.   clopidogrel 75 MG tablet Commonly known as: PLAVIX Take 1 tablet (75 mg total) by mouth daily.   Co Q-10 200 MG Caps Take 200 mg by mouth daily. Notes to patient: Take as you were at home.   Eliquis 5 MG Tabs tablet Generic drug: apixaban Take 1  tablet by mouth twice daily What changed: how much to take   Ferrous Sulfate 27 MG Tabs Take 27 mg by mouth daily.   folic acid 621 MCG tablet Commonly known as: FOLVITE Take 800 mcg by mouth daily.   hydrALAZINE 25 MG tablet Commonly known as: APRESOLINE Take 1 tablet (25 mg total) by mouth 3 (three) times daily. Take one if BP >140/70 What changed:   how much to take  additional instructions   L-Arginine 1000 MG Tabs Take 1,000 mg by mouth daily. Notes to patient: Take as you were at home.   LUTEIN PO Take 1 tablet by mouth daily. Notes to patient: Take as you were at home.   Magnesium 250 MG Tabs Take 500 mg by mouth daily.   omega-3 acid ethyl esters 1 g capsule Commonly known as: LOVAZA Take 1 g by mouth daily. Notes to patient: Take as you were at home.   oxyCODONE 5 MG immediate release tablet Commonly known as: Oxy IR/ROXICODONE Take 1 tablet (5 mg total) by mouth every 6 (six) hours as needed for moderate pain.   PAPAYA ENZYME PO Take 1 tablet by mouth daily. Notes to patient: Take as you were at home.   PSYLLIUM PO Take 1 tablet by mouth daily. Notes to patient: Take as you were at home.   rosuvastatin 10 MG tablet Commonly known as: CRESTOR Take 10 mg by mouth daily.   valsartan 160 MG tablet Commonly known as: Diovan Take 1 tablet (160 mg total) by mouth daily. Notes to patient: Take as you were at home.   Vitamin A 2400 MCG (8000 UT) Caps Take 8,000 Units by mouth daily. Notes to patient: Take as you were at home.   vitamin C 500 MG tablet Commonly known as: ASCORBIC ACID Take 500 mg by mouth daily. Notes to patient: Take as you were at home.   Vitamin D 125 MCG (5000 UT) Caps Take 5,000 Units by mouth daily. Notes to patient: Take as you were at home.   zinc gluconate 50 MG tablet Take 50 mg by mouth daily. Notes to patient: Take as you were at home.        Discharge Instructions:   [facility] Discharge Instructions Carotid Endarterectomy (CEA)  Please refer to the following instructions for your post-procedure care. Your surgeon or physician assistant will discuss any changes with you.  Activity  You are encouraged to walk as much as you can. You can slowly return to normal activities but must avoid strenuous activity and heavy lifting until your doctor tell you it's OK. Avoid activities such as vacuuming or swinging a golf club. You can drive after one week if you are comfortable and you are no  longer taking prescription pain medications. It is normal to feel tired for serval weeks after your surgery. It is also normal to have difficulty with sleep habits, eating, and bowel movements after surgery. These will go away with time.  Bathing/Showering  You may shower after you come home. Do not soak in a bathtub, hot tub, or swim until the incision heals completely.  Incision Care  Shower every day. Clean your incision with mild soap and water. Pat the area dry with a clean towel. You do not need a bandage unless otherwise instructed. Do not apply any ointments or creams to your incision. You may have skin glue on your incision. Do not peel it off. It will come off on its own in about one week. Your incision may feel thickened and raised for several weeks after your surgery. This is normal and the skin will soften over time. For Men Only: It's OK to shave around the incision but do not shave the incision itself for 2 weeks. It is common to have numbness under your chin that could last for several months.  Diet  Resume your normal diet. There are no special food restrictions following this procedure. A low fat/low cholesterol diet is recommended for all patients with vascular disease. In order to heal from your surgery, it is CRITICAL to get adequate nutrition. Your body requires vitamins, minerals, and protein. Vegetables are the best source of vitamins and minerals. Vegetables  also provide the perfect balance of protein. Processed food has little nutritional value, so try to avoid this.  Medications  Resume taking all of your medications unless your doctor or physician assistant tells you not to.  If your incision is causing pain, you may take over-the- counter pain relievers such as acetaminophen (Tylenol). If you were prescribed a stronger pain medication, please be aware these medications can cause nausea and constipation.  Prevent nausea by taking the medication with a snack or meal. Avoid constipation by drinking plenty of fluids and eating foods with a high amount of fiber, such as fruits, vegetables, and grains. Do not take Tylenol if you are taking prescription pain medications.  Follow Up  Our office will schedule a follow up appointment 2-3 weeks following discharge.  Please call us immediately for any of the following conditions  . Increased pain, redness, drainage (pus) from your incision site. . Fever of 101 degrees or higher. . If you should develop stroke (slurred speech, difficulty swallowing, weakness on one side of your body, loss of vision) you should call 911 and go to the nearest emergency room. .  Reduce your risk of vascular disease:  . Stop smoking. If you would like help call [facility] at 1-800-QUIT-NOW (438)118-2052) or [place] at [phone]. . Manage your cholesterol . Maintain a desired weight . Control your diabetes . Keep your blood pressure down .  If you have any questions, please call the office at [phone].  Disposition: Home  Patient's condition: is Good  Follow up: 1. Dr. Oneida Alar in 4 weeks.   Dagoberto Ligas, PA-C [facility] [phone]   --- For [facility] Registry use ---   Modified Rankin score at D/C (0-6): 0  IV medication needed for:  1. Hypertension: No 2. Hypotension: No  Post-op Complications: No  1. Post-op CVA or TIA: No  If yes: Event classification (right eye, left  eye, right cortical, left cortical, verterobasilar, other):   If yes: Timing of event (intra-op, <6 hrs post-op, >=6 hrs post-op, unknown):  2. CN injury: No  If yes: CN  injuried   3. Myocardial infarction: No  If yes: Dx by (EKG or clinical, Troponin):   4.  CHF: No  5.  Dysrhythmia (new): No  6. Wound infection: No  7. Reperfusion symptoms: No  8. Return to OR: No  If yes: return to OR for (bleeding, neurologic, other CEA incision, other):   Discharge medications: Statin use:  Yes ASA use:  Yes   Beta blocker use:  No ACE-Inhibitor use:  No  ARB use:  Yes CCB use: No P2Y12 Antagonist use: Yes, [x ] Plavix, [ ]  Plasugrel, [ ]  Ticlopinine, [ ]  Ticagrelor, [ ]  Other, [ ]  No for medical reason, [ ]  Non-compliant, [ ]  Not-indicated Anti-coagulant use:  Yes, [ ]  Warfarin, [ ]  Rivaroxaban, [ ]  Dabigatran, [x]  Eliquis

## 2020-08-04 ENCOUNTER — Telehealth: Payer: Self-pay

## 2020-08-04 ENCOUNTER — Encounter: Payer: Medicare Other | Admitting: Vascular Surgery

## 2020-08-04 NOTE — Telephone Encounter (Signed)
Patient called to question transfusion of blood during TCAR. Explained about diversion of blood from femoral stick to carotid incision during the surgery. Patient verbalized understanding.

## 2020-08-08 ENCOUNTER — Ambulatory Visit: Payer: Medicare Other | Admitting: Cardiology

## 2020-08-10 ENCOUNTER — Telehealth: Payer: Self-pay

## 2020-08-10 NOTE — Telephone Encounter (Signed)
Patient left VM about some pressure/pain below the navel. Says it has resolved, thinks it was gas.Denies any problems with incisions. Instructed patient to call back with further issues. He verbalizes understanding.

## 2020-08-18 ENCOUNTER — Other Ambulatory Visit: Payer: Self-pay

## 2020-08-18 DIAGNOSIS — I6522 Occlusion and stenosis of left carotid artery: Secondary | ICD-10-CM

## 2020-08-22 ENCOUNTER — Ambulatory Visit: Payer: Medicare Other | Admitting: Cardiology

## 2020-08-22 ENCOUNTER — Encounter: Payer: Self-pay | Admitting: Cardiology

## 2020-08-22 ENCOUNTER — Other Ambulatory Visit: Payer: Self-pay

## 2020-08-22 VITALS — BP 172/82 | HR 54 | Resp 16 | Ht 67.0 in | Wt 163.0 lb

## 2020-08-22 DIAGNOSIS — I6523 Occlusion and stenosis of bilateral carotid arteries: Secondary | ICD-10-CM

## 2020-08-22 DIAGNOSIS — Z95828 Presence of other vascular implants and grafts: Secondary | ICD-10-CM

## 2020-08-22 DIAGNOSIS — I10 Essential (primary) hypertension: Secondary | ICD-10-CM

## 2020-08-22 NOTE — Progress Notes (Signed)
Primary Physician/Referring:  Leighton Ruff, MD  Patient ID: Dalton Cardenas, male    DOB: 04/23/1934, 86 y.o.   MRN: [id number]  Chief Complaint  Patient presents with  . Hypertension  . Carotid    Stenting   HPI:    Dalton Cardenas  is a 86 y.o. 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 Cristopher Peru and recommended either ablation or continued medical therapy.  Patient with recurrence of atypical atrial flutter 06/30/2020. Patient underwent left transcarotid revascularization (TCAR), stent left internal carotid artery by Dr. Ruta Hinds 08/01/2020.  He has not had any post procedural complications.  He now presents for follow-up.  States that his blood pressure is well controlled and brings home blood pressure recordings.  No other specific symptoms, his wife is present at the bedside.  Presently tolerating all his medications well.  On his last office visit I prescribed him Xanax due to extreme anxiety due to carotid stenosis.  Since TCAR, he has used it once or twice only.  His wife is present at the bedside.  No bleeding diathesis on Eliquis and Plavix.  He is seeing Dr. Oneida Alar in 2 days.  Past Medical History:  Diagnosis Date  . Coronary artery disease    60-70% circumflex (cath in [place] 08/2010).  Marland Kitchen Dysrhythmia    A history of A flutter and tachycardia (SVT)  . GERD (gastroesophageal reflux disease)   . Hypercholesterolemia   . Hypertension   . Prostate cancer [facility])    status post prostatectomy  . Sleep apnea   . 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 SVT (supraventricular tachycardia) ([place])    a. Holter monitoring 2011 - AV node reentry, multifocal atrial tachycardia, and resting bradycardia. b. previously tx with [facility] in [place].   Past Surgical History:  Procedure  Laterality Date  . ABLATION OF DYSRHYTHMIC FOCUS  10/13/2014   SVT         by Dr Lovena Le  . ANAL FISSURE REPAIR    . CARDIOVERSION N/A 07/01/2020   Procedure: CARDIOVERSION;  Surgeon: Adrian Prows, MD;  Location: [facility];  Service: Cardiovascular;  Laterality: N/A;  . ELECTROPHYSIOLOGY STUDY N/A 10/13/2014   Procedure: ELECTROPHYSIOLOGY STUDY;  Surgeon: Evans Lance, MD;  Location: [facility] CATH LAB;  Service: Cardiovascular;  Laterality: N/A;  . EYE SURGERY Bilateral 2011   cataract   . PROSTATECTOMY    . SUPRAVENTRICULAR TACHYCARDIA ABLATION N/A 10/13/2014   Procedure: SUPRAVENTRICULAR TACHYCARDIA ABLATION;  Surgeon: Evans Lance, MD;  Location: [facility] CATH LAB;  Service: Cardiovascular;  Laterality: N/A;  . TRANSCAROTID ARTERY REVASCULARIZATION (TCAR) Left 08/01/2020  . TRANSCAROTID ARTERY REVASCULARIZATION Left 08/01/2020   Procedure: LEFT TRANSCAROTID ARTERY REVASCULARIZATION;  Surgeon: Elam Dutch, MD;  Location: [place] [facility];  Service: Vascular;  Laterality: Left;  . ULTRASOUND GUIDANCE FOR VASCULAR ACCESS Right 08/01/2020   Procedure: ULTRASOUND GUIDANCE FOR VASCULAR ACCESS;  Surgeon: Elam Dutch, MD;  Location: [facility] OR;  Service: Vascular;  Laterality: Right;   Social History   Tobacco Use  . Smoking status: Never Smoker  . Smokeless tobacco: Never Used  Substance Use Topics  . Alcohol use: No   ROS  Review of Systems  Cardiovascular: Positive for leg swelling. Negative for chest pain and dyspnea on exertion. Irregular heartbeat: bilateral legs.  Gastrointestinal: Negative  for melena.   Objective  Blood pressure (!) 172/82, pulse (!) 54, resp. rate 16, height 5\' 7"  (1.702 m), weight 163 lb (73.9 kg), SpO2 98 %.  Vitals with BMI 08/22/2020 08/22/2020 08/02/2020  Height - 5\' 7"  -  Weight - 163 lbs -  BMI - 20.23 -  Systolic 343 568 616  Diastolic 82 81 48  Pulse 54 56 54     Physical Exam Neck:     Thyroid: No thyromegaly.  Cardiovascular:     Rate and Rhythm: Normal  rate and regular rhythm.     Pulses: Intact distal pulses.          Carotid pulses are on the right side with bruit and on the left side with bruit.      Femoral pulses are 2+ on the right side and 2+ on the left side.      Popliteal pulses are 2+ on the right side and 2+ on the left side.       Dorsalis pedis pulses are 2+ on the right side and 2+ on the left side.       Posterior tibial pulses are 2+ on the right side and 2+ on the left side.     Heart sounds: Normal heart sounds. No murmur heard.  No gallop.      Comments: 2 + bilateral pitting below knee leg edema, no JVD. Left carotid endarterectomy.  Pulmonary:     Effort: Pulmonary effort is normal.     Breath sounds: Normal breath sounds.  Abdominal:     General: Bowel sounds are normal.     Palpations: Abdomen is soft.  Musculoskeletal:     Cervical back: Neck supple.  Skin:    General: Skin is warm and dry.   Vitals reviewed. Exam unchanged/stable from previous.  Laboratory examination:   Recent Labs    06/10/20 1637 06/30/20 0532 07/02/20 0955 07/07/20 1026 07/26/20 1513 08/01/20 1419 08/02/20 0500  NA 133*   < >  --  131* 135  --  132*  K 5.1   < >  --  4.7 4.4  --  4.3  CL 99   < >  --  97 103  --  104  CO2 23   < >  --  27 23  --  22  GLUCOSE 87   < >  --  112* 103*  --  98  BUN 18   < >  --  22 16  --  13  CREATININE 1.07   < >   < > 1.13 1.00 0.96 1.02  CALCIUM 9.5   < >  --  9.6 9.6  --  7.7*  GFRNONAA 63   < >   < >  --  >60 >60 >60  GFRAA 73  --   --   --   --   --   --    < > = values in this interval not displayed.   estimated creatinine clearance is 50.4 mL/min (by C-G formula based on SCr of 1.02 mg/dL).  CMP Latest Ref Rng & Units 08/02/2020 08/01/2020 07/26/2020  Glucose 70 - 99 mg/dL 98 - 103(H)  BUN 8 - 23 mg/dL 13 - 16  Creatinine 0.61 - 1.24 mg/dL 1.02 0.96 1.00  Sodium 135 - 145 mmol/L 132(L) - 135  Potassium 3.5 - 5.1 mmol/L 4.3 - 4.4  Chloride 98 - 111 mmol/L 104 - 103  CO2 22 - 32  mmol/L  22 - 23  Calcium 8.9 - 10.3 mg/dL 7.7(L) - 9.6  Total Protein 6.5 - 8.1 g/dL - - 7.4  Total Bilirubin 0.3 - 1.2 mg/dL - - 1.1  Alkaline Phos 38 - 126 U/L - - 63  AST 15 - 41 U/L - - 26  ALT 0 - 44 U/L - - 23   CBC Latest Ref Rng & Units 08/02/2020 08/01/2020 07/26/2020  WBC 4.0 - 10.5 K/uL 9.0 11.5(H) 8.6  Hemoglobin 13.0 - 17.0 g/dL 10.2(L) 13.2 12.8(L)  Hematocrit 39 - 52 % 30.1(L) 39.4 39.8  Platelets 150 - 400 K/uL 220 255 296   Lipid Panel  No results found for: CHOL, TRIG, HDL, CHOLHDL, VLDL, LDLCALC, LDLDIRECT HEMOGLOBIN A1C No results found for: HGBA1C, MPG TSH Recent Labs    06/30/20 1406  TSH 1.774    External labs 07/06/2019:   Cholesterol, total 189.000 01/25/2020 HDL 46.000 01/25/2020 LDL-C 123.000 01/25/2020 Triglycerides 108.000 01/25/2020  A1C 6.300 01/25/2020 TSH 4.640 01/25/2020   Hemoglobin 12.800 01/25/2020  Creatinine, Serum 1.150 01/25/2020 Potassium 4.100 01/25/2020 Magnesium N/D ALT (SGPT) 20.000 01/25/2020   Cholesterol, total 281.000 07/06/2019 HDL 42.000 07/06/2019 LDL 206.000 07/06/2019 Triglycerides 168.000 07/06/2019 A1C 5.800 07/06/2019 Hemoglobin 12.800 07/06/2019 Creatinine, Serum 1.140 07/06/2019 Potassium 4.200 07/06/2019 Magnesium N/D ALT (SGPT) 12.000 07/06/2019  Medications and allergies   Allergies  Allergen Reactions  . Amoxicillin Diarrhea    GI upset: Bleeding diarrhea  Other reaction(s): GI Bleed  . Ibuprofen Diarrhea    Bloody diarrhea   . Penicillins Diarrhea    Bloody diarrhea   . Ace Inhibitors Swelling    Face swelling   . Amlodipine Swelling  . Spironolactone     Hyponatremia   . Atorvastatin     Weakness   . Azithromycin     Stomach ache   . Diltiazem Nausea Only    Upset stomach per patient  . Hydrochlorothiazide Other (See Comments)    Photosensitivity    Current Outpatient Medications on File Prior to Visit  Medication Sig Dispense Refill  . acebutolol (SECTRAL) 200 MG capsule Take 1  capsule (200 mg total) by mouth 2 (two) times daily. 60 capsule 3  . ALPRAZolam (XANAX) 0.25 MG tablet Take 1 tablet (0.25 mg total) by mouth at bedtime as needed for sleep. 30 tablet 0  . aspirin EC 81 MG tablet Take 1 tablet (81 mg total) by mouth daily. 70 tablet 1  . b complex vitamins tablet Take 1 tablet by mouth daily.     . Cholecalciferol (VITAMIN D) 125 MCG (5000 UT) CAPS Take 5,000 Units by mouth daily.     . Chromium 1000 MCG TABS Take 1,000 mcg by mouth daily.    . clopidogrel (PLAVIX) 75 MG tablet Take 1 tablet (75 mg total) by mouth daily. 70 tablet 1  . Coenzyme Q10 (CO Q-10) 200 MG CAPS Take 200 mg by mouth daily.     . Cyanocobalamin (B-12) 5000 MCG CAPS Take 5,000 mcg by mouth daily.    Marland Kitchen ELIQUIS 5 MG TABS tablet Take 1 tablet by mouth twice daily (Patient taking differently: Take 5 mg by mouth 2 (two) times daily. ) 180 tablet 0  . Ferrous Sulfate 27 MG TABS Take 27 mg by mouth daily.    . folic acid (FOLVITE) 324 MCG tablet Take 800 mcg by mouth daily.      . hydrALAZINE (APRESOLINE) 25 MG tablet Take 1 tablet (25 mg total) by mouth 3 (three) times daily. Take one  if BP >140/70 (Patient taking differently: Take 25 mg by mouth 3 (three) times daily. Take one extra if BP >140/70)    . L-Arginine 1000 MG TABS Take 1,000 mg by mouth daily.     . LUTEIN PO Take 1 tablet by mouth daily.     . Magnesium 250 MG TABS Take 500 mg by mouth daily.     Marland Kitchen omega-3 acid ethyl esters (LOVAZA) 1 g capsule Take 1 g by mouth daily.     Marland Kitchen PAPAYA ENZYME PO Take 1 tablet by mouth daily.    . Probiotic Product (ALIGN PO) Take 1 capsule by mouth daily.     . PSYLLIUM PO Take 1 tablet by mouth daily.     . rosuvastatin (CRESTOR) 10 MG tablet Take 10 mg by mouth daily.      . valsartan (DIOVAN) 160 MG tablet Take 1 tablet (160 mg total) by mouth daily. 90 tablet 3  . Vitamin A 2400 MCG (8000 UT) CAPS Take 8,000 Units by mouth daily.    . vitamin C (ASCORBIC ACID) 500 MG tablet Take 500 mg by mouth  daily.    Marland Kitchen zinc gluconate 50 MG tablet Take 50 mg by mouth daily.      No current facility-administered medications on file prior to visit.    Radiology:  No results found.   Chest x-ray 06/30/2020: Lungs are clear.  No pleural effusion or pneumothorax. The heart is normal in size.  Thoracic aortic atherosclerosis. Degenerative changes of the visualized thoracolumbar spine.  Cardiac Studies:  Coronary Angiogram  [2011]: Performed in [place], 70% stenosis in the circumflex coronary artery. Was performed as a part of workup for EP study for the SVT.  Nuclear stress test  [04/26/2010]: Myocardial perfusion scan 04/26/2010: Normal perfusion, EF 77%.  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  Echocardiogram 06/03/2020: Left ventricle cavity is normal in size. Moderate asymmetric hypertrophy of the left ventricle. Basal septum measures at 2.0 cm. Mild LVOT peak gradient 5 mmHg without SAM or LVOT obstruction. Normal wall motion. LVEF 55-60%. Indeterminate diastolic dysfunction. Left atrial cavity is mildly dilated. Trileaflet aortic valve. Trace aortic stenosis. Moderate (Grade II) aortic regurgitation. Mild calcification, No significant stenosis. Moderate (Grade II) mitral regurgitation. Mild tricuspid regurgitation. Estimated pulmonary artery systolic pressure 30 mmHg.  No significant change compared to previous study in 2018.  Carotid artery duplex  06/03/2020: Stenosis in the right internal carotid artery (16-49%). Stenosis in the left internal carotid artery (>=70%). The left PSV internal/common carotid artery ratio is consistent with a stenosis of >70%. The duplex suggest near subtotal occlusion with soft plaque at the level of the proximal left ICA.  Recommend different modality imaging to confirm severity of the disease.  Follow up study in six  months is appropriate if clinically indicated.  CT angio neck 06/28/2020: 1.  Noncalcified atherosclerotic disease in the bifurcation of the right ICA <50% stenosis. 2. 80% stenosis of the proximal left internal carotid artery secondary to noncalcified plaque. 3. 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 4. Aortic Atherosclerosis (ICD10-I70.0).  CT angio head W or WO Contrast 07/12/2020:  No acute intracranial abnormality. Mild chronic microvascular ischemic changes. Chronic right caudate infarct. Short segment marked stenosis of the distal intracranial right vertebral artery.  Transcarotid  artery revascularization 08/01/2020: Left transcarotid revascularization (TCAR), stent left internal carotid artery  EKG:    EKG 07/11/2020: Sinus rhythm at a rate of 70 bpm with first-degree AV block, left atrial enlargement.  Left axis deviation, left anterior fascicular block.  Poor R wave progression, cannot exclude anteroseptal infarct old.  Compared to EKG/16/2021, no significant change.  Assessment     ICD-10-CM   1. Essential hypertension, benign  I10   2. Asymptomatic bilateral carotid artery stenosis  I65.23   3. Presence of internal carotid stent: Left TCAR  Z95.828     CHA2DS2-VASc Score is 4.  Yearly risk of stroke: 4.8% (A, HTN, Vasc Dz).  Score of 1=0.6; 2=2.2; 3=3.2; 4=4.8; 5=7.2; 6=9.8; 7=>9.8) -(CHF; HTN; vasc disease DM,  Male = 1; Age <65 =0; 65-74 = 1,  >75 =2; stroke/embolism= 2).    No orders of the defined types were placed in this encounter.  Medications Discontinued During This Encounter  Medication Reason  . oxyCODONE (OXY IR/ROXICODONE) 5 MG immediate release tablet Patient Preference    Recommendations:   Dalton Cardenas  is a 86 y.o.  [place] male with hypertension, hyperlipidemia, OSA on CPAP, history of of AVNRT ablation On  10/10/2014, and atrial flutter, typical diagnosed in 2020, was evaluated by Dr. Cristopher Peru and recommended either ablation or continued medical therapy.  Patient with recurrence of atypical atrial flutter 06/30/2020. Patient underwent left transcarotid revascularization (TCAR), stent left internal carotid artery by Dr. Ruta Hinds 08/01/2020.  He has not had any post procedural complications.  He now presents for follow-up.    His blood pressure is markedly elevated, home BP recordings are excellent. I will  enroll the patient in Remote Patient Monitoring and Principal Care Management as patient is high risk for hospitalization and complications from underlying medical conditions especially CVA.  Advised him to take hydralazine extra dose up to 100 mg if necessary if SBP >140 mmHg.  Home blood pressure recordings show less than 622 mmHg systolic.  He is presently tolerating Plavix and also Eliquis without bleeding diathesis.  His surgical site has healed well.  He has an appointment to see Dr. Oneida Alar in 2 to 3 days.  After his next visit, I could potentially take over his care including carotid artery surveillance.  Otherwise on appropriate medical therapy including statins.  I will see him back in 6 months.   Adrian Prows, MD, [facility] 08/22/2020, 3:57 PM Office: [phone] Pager: [phone]   CC: Ruta Hinds, MD

## 2020-08-25 ENCOUNTER — Encounter: Payer: Self-pay | Admitting: Vascular Surgery

## 2020-08-25 ENCOUNTER — Ambulatory Visit (HOSPITAL_COMMUNITY)
Admission: RE | Admit: 2020-08-25 | Discharge: 2020-08-25 | Disposition: A | Discharge status: Home / Self Care | Payer: Medicare Other | Source: Ambulatory Visit | Attending: Vascular Surgery | Admitting: Vascular Surgery

## 2020-08-25 ENCOUNTER — Ambulatory Visit (INDEPENDENT_AMBULATORY_CARE_PROVIDER_SITE_OTHER): Payer: Self-pay | Admitting: Vascular Surgery

## 2020-08-25 ENCOUNTER — Other Ambulatory Visit: Payer: Self-pay

## 2020-08-25 VITALS — BP 189/82 | HR 71 | Temp 97.9°F | Resp 20 | Ht 67.0 in | Wt 163.0 lb

## 2020-08-25 DIAGNOSIS — I6522 Occlusion and stenosis of left carotid artery: Secondary | ICD-10-CM | POA: Insufficient documentation

## 2020-08-25 NOTE — Progress Notes (Signed)
Patient is an 86 year old male who returns for postoperative follow-up today.  He recently underwent left TCAR carotid stenting.  He has had no symptoms of TIA amaurosis or stroke.  He is currently on Plavix aspirin Eliquis and a statin.  The plan is to stop his Plavix after 6 weeks of total therapy.  This will be on December 31.  He is having some gum bleeding but otherwise no other bleeding complications.  Current Outpatient Medications on File Prior to Visit  Medication Sig Dispense Refill  . acebutolol (SECTRAL) 200 MG capsule Take 1 capsule (200 mg total) by mouth 2 (two) times daily. 60 capsule 3  . ALPRAZolam (XANAX) 0.25 MG tablet Take 1 tablet (0.25 mg total) by mouth at bedtime as needed for sleep. 30 tablet 0  . aspirin EC 81 MG tablet Take 1 tablet (81 mg total) by mouth daily. 70 tablet 1  . b complex vitamins tablet Take 1 tablet by mouth daily.     . Cholecalciferol (VITAMIN D) 125 MCG (5000 UT) CAPS Take 5,000 Units by mouth daily.     . Chromium 1000 MCG TABS Take 1,000 mcg by mouth daily.    . clopidogrel (PLAVIX) 75 MG tablet Take 1 tablet (75 mg total) by mouth daily. 70 tablet 1  . Coenzyme Q10 (CO Q-10) 200 MG CAPS Take 200 mg by mouth daily.     . Cyanocobalamin (B-12) 5000 MCG CAPS Take 5,000 mcg by mouth daily.    Marland Kitchen ELIQUIS 5 MG TABS tablet Take 1 tablet by mouth twice daily (Patient taking differently: Take 5 mg by mouth 2 (two) times daily.) 180 tablet 0  . Ferrous Sulfate 27 MG TABS Take 27 mg by mouth daily.    . folic acid (FOLVITE) 811 MCG tablet Take 800 mcg by mouth daily.    . hydrALAZINE (APRESOLINE) 25 MG tablet Take 1 tablet (25 mg total) by mouth 3 (three) times daily. Take one if BP >140/70 (Patient taking differently: Take 25 mg by mouth 3 (three) times daily. Take one extra if BP >140/70)    . L-Arginine 1000 MG TABS Take 1,000 mg by mouth daily.     . LUTEIN PO Take 1 tablet by mouth daily.     . Magnesium 250 MG TABS Take 500 mg by mouth daily.     Marland Kitchen  omega-3 acid ethyl esters (LOVAZA) 1 g capsule Take 1 g by mouth daily.     Marland Kitchen PAPAYA ENZYME PO Take 1 tablet by mouth daily.    . Probiotic Product (ALIGN PO) Take 1 capsule by mouth daily.     . PSYLLIUM PO Take 1 tablet by mouth daily.     . rosuvastatin (CRESTOR) 10 MG tablet Take 10 mg by mouth daily.      . valsartan (DIOVAN) 160 MG tablet Take 1 tablet (160 mg total) by mouth daily. 90 tablet 3  . Vitamin A 2400 MCG (8000 UT) CAPS Take 8,000 Units by mouth daily.    . vitamin C (ASCORBIC ACID) 500 MG tablet Take 500 mg by mouth daily.    Marland Kitchen zinc gluconate 50 MG tablet Take 50 mg by mouth daily.     No current facility-administered medications on file prior to visit.   Physical exam:  Vitals:   08/25/20 1552 08/25/20 1555  BP: (!) 189/83 (!) 189/82  Pulse: 71   Resp: 20   Temp: 97.9 F (36.6 C)   SpO2: 97%   Weight: 163 lb (73.9  kg)   Height: 5\' 7"  (1.702 m)     Neck: Well-healed left neck incision  Neuro: Symmetric upper extremity lower extremity motor strength 5/5 motor no facial asymmetry  Data: Patient had a carotid duplex exam today which showed no significant right-sided stenosis widely patent stent on the left side.  Assessment: Patient doing well status post left transcarotid stenting for asymptomatic carotid stenosis.  Plan: Patient will follow up in our APP clinic in 9 months with repeat carotid duplex exam.  He will call sooner if he has any problems.  He will plan to stop his Plavix on September 16, 2020.  He will continue Eliquis and aspirin.  Ruta Hinds, MD [facility] Office: [phone]

## 2020-08-26 ENCOUNTER — Other Ambulatory Visit: Payer: Self-pay

## 2020-08-26 DIAGNOSIS — I6522 Occlusion and stenosis of left carotid artery: Secondary | ICD-10-CM

## 2020-09-23 ENCOUNTER — Telehealth: Payer: Self-pay | Admitting: Pharmacist

## 2020-09-23 NOTE — Telephone Encounter (Signed)
Called regarding recent elevated BP readings. Average home BP readings over the past week of 163/77 mmHg. Pt denies any complains of CP, HA, SOB, edema. Med list reviewed and updated. Currently on acebutolol 200 mg BID hydralazine 50 mg TID, and valsartan 160 mg. Reviewed pt is able to take extra hydralazine if SBP>140 mmHg. Will continue monitoring, if BP continues to remain elevated, may consider increasing valsartan dose to 320 mg daily.

## 2020-09-26 ENCOUNTER — Other Ambulatory Visit: Payer: Self-pay | Admitting: Cardiology

## 2020-09-26 DIAGNOSIS — I10 Essential (primary) hypertension: Secondary | ICD-10-CM

## 2020-09-26 DIAGNOSIS — I483 Typical atrial flutter: Secondary | ICD-10-CM

## 2020-09-26 MED ORDER — HYDRALAZINE HCL 50 MG PO TABS: 50.0000 mg | ORAL_TABLET | Freq: Three times a day (TID) | ORAL | 3 refills | Status: DC

## 2020-10-13 ENCOUNTER — Other Ambulatory Visit: Payer: Self-pay | Admitting: Pharmacist

## 2020-10-13 DIAGNOSIS — I10 Essential (primary) hypertension: Secondary | ICD-10-CM

## 2020-10-15 MED ORDER — VALSARTAN 160 MG PO TABS: 160.0000 mg | ORAL_TABLET | Freq: Every day | ORAL | 3 refills | Status: DC

## 2020-10-20 DIAGNOSIS — M25562 Pain in left knee: Secondary | ICD-10-CM | POA: Diagnosis not present

## 2020-10-24 ENCOUNTER — Other Ambulatory Visit: Payer: Self-pay

## 2020-10-24 ENCOUNTER — Emergency Department (HOSPITAL_COMMUNITY)
Admission: EM | Admit: 2020-10-24 | Discharge: 2020-10-24 | Discharge status: Against Medical Advice | Payer: Medicare Other | Attending: Family Medicine | Admitting: Family Medicine

## 2020-10-24 ENCOUNTER — Ambulatory Visit (HOSPITAL_COMMUNITY)
Admission: RE | Admit: 2020-10-24 | Discharge: 2020-10-24 | Disposition: A | Discharge status: Home / Self Care | Payer: Medicare Other | Source: Ambulatory Visit | Attending: Family Medicine | Admitting: Family Medicine

## 2020-10-24 ENCOUNTER — Other Ambulatory Visit (HOSPITAL_COMMUNITY): Payer: Self-pay | Admitting: Family Medicine

## 2020-10-24 DIAGNOSIS — M79605 Pain in left leg: Secondary | ICD-10-CM | POA: Insufficient documentation

## 2020-10-24 DIAGNOSIS — I82812 Embolism and thrombosis of superficial veins of left lower extremities: Secondary | ICD-10-CM | POA: Insufficient documentation

## 2020-10-24 DIAGNOSIS — M7989 Other specified soft tissue disorders: Secondary | ICD-10-CM | POA: Insufficient documentation

## 2020-10-24 DIAGNOSIS — M1712 Unilateral primary osteoarthritis, left knee: Secondary | ICD-10-CM | POA: Diagnosis not present

## 2020-10-24 DIAGNOSIS — Z5321 Procedure and treatment not carried out due to patient leaving prior to being seen by health care provider: Secondary | ICD-10-CM | POA: Insufficient documentation

## 2020-10-24 DIAGNOSIS — I82412 Acute embolism and thrombosis of left femoral vein: Secondary | ICD-10-CM | POA: Insufficient documentation

## 2020-10-24 DIAGNOSIS — M25462 Effusion, left knee: Secondary | ICD-10-CM | POA: Diagnosis not present

## 2020-10-24 NOTE — ED Notes (Signed)
Pt spoke with his cardiologist and was told he can leave at this time. Pt following up with cards and left at this time.

## 2020-10-26 ENCOUNTER — Telehealth: Payer: Self-pay | Admitting: Pulmonary Disease

## 2020-10-26 DIAGNOSIS — I82412 Acute embolism and thrombosis of left femoral vein: Secondary | ICD-10-CM

## 2020-10-26 NOTE — Telephone Encounter (Signed)
Developed LT knee swelling Ortho - unable to tap 2/7 Duplex showed age indeterminate DVT LT femoral vein & LT small saphenous  Already on eliquis 5 bid D/w cards Dr Einar Gip , we doubt that this represents eliquis failure Advised  - increase eliquis 10 mg bid x 1 week then back down to 5mg  bid Will obtain hematology consult & repeat duplex LLE in 1-2 weeks - ordered

## 2020-10-27 ENCOUNTER — Telehealth: Payer: Self-pay | Admitting: Hematology

## 2020-10-27 NOTE — Telephone Encounter (Signed)
Received a new hem referral from Dr. Elsworth Soho for dvt. Ms. Dalton Cardenas returned my call and has been scheduled to see Dr. Irene Limbo on 2/22 at 1pm. Pt aware to arrive 20 minutes early.

## 2020-10-31 ENCOUNTER — Encounter: Payer: Self-pay | Admitting: Student

## 2020-10-31 ENCOUNTER — Ambulatory Visit: Payer: Medicare Other | Admitting: Student

## 2020-10-31 ENCOUNTER — Other Ambulatory Visit: Payer: Self-pay

## 2020-10-31 VITALS — BP 162/67 | HR 54 | Temp 98.0°F | Ht 67.0 in | Wt 159.0 lb

## 2020-10-31 DIAGNOSIS — I10 Essential (primary) hypertension: Secondary | ICD-10-CM | POA: Diagnosis not present

## 2020-10-31 DIAGNOSIS — I82402 Acute embolism and thrombosis of unspecified deep veins of left lower extremity: Secondary | ICD-10-CM | POA: Diagnosis not present

## 2020-10-31 DIAGNOSIS — R0602 Shortness of breath: Secondary | ICD-10-CM | POA: Diagnosis not present

## 2020-10-31 MED ORDER — HYDRALAZINE HCL 50 MG PO TABS: 50.0000 mg | ORAL_TABLET | Freq: Four times a day (QID) | ORAL | 3 refills | Status: DC

## 2020-10-31 NOTE — Progress Notes (Signed)
Primary Physician/Referring:  Aretta Nip, MD  Patient ID: Dalton Cardenas, male    DOB: 12-18-1933, 87 y.o.   MRN: [id number]  Chief Complaint  Patient presents with  . Shortness of Breath   HPI:    Dalton Cardenas  is a 87 y.o. 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 Cristopher Peru and recommended either ablation or continued medical therapy.  Patient with recurrence of atypical atrial flutter 06/30/2020. Patient underwent left transcarotid revascularization (TCAR), stent left internal carotid artery by Dr. Ruta Hinds 08/01/2020.   Patient presents for urgent visit with complaints of shortness of breath, his wife is present at bedside. Recent lower extremity duplex 10/24/2020 showed age-indeterminate DVT of the left femoral vein and left small saphenous, therefore Eliquis was increased to 10 mg twice daily for a week, then back to 5 mg twice daily as advised by Dr. Einar Gip.  Patient has been referred to hematology by his PCP for further recommendations. He continues to follow with Dr. Oneida Alar' office. Notably patient fell and injured his left knee recently, he is following with ortho and his PCP.   Patient reports last night he had an episode of shortness of breath while lying in bed after moving his left leg which cause pain. The shortness of breath lasted several minutes and resolved spontaneously. He also reports and episode of epigastric discomfort, which he describes as pressure, yesterday last about 5 minutes. Both the epigastric discomfort and shortness of breath were at rest. Denies chest pain, palpitations, syncope, near-syncope, orthopnea, PND. Patient's blood pressure has also trended up over the last 2 weeks since his fall.   Past Medical History:  Diagnosis Date  . Coronary artery disease    60-70% circumflex (cath in [place] 08/2010).  Marland Kitchen Dysrhythmia     A history of A flutter and tachycardia (SVT)  . GERD (gastroesophageal reflux disease)   . Hypercholesterolemia   . Hypertension   . Prostate cancer [facility])    status post prostatectomy  . Sleep apnea   . 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 SVT (supraventricular tachycardia) ([place])    a. Holter monitoring 2011 - AV node reentry, multifocal atrial tachycardia, and resting bradycardia. b. previously tx with [facility] in [place].   Past Surgical History:  Procedure Laterality Date  . ABLATION OF DYSRHYTHMIC FOCUS  10/13/2014   SVT         by Dr Lovena Le  . ANAL FISSURE REPAIR    . CARDIOVERSION N/A 07/01/2020   Procedure: CARDIOVERSION;  Surgeon: Adrian Prows, MD;  Location: [facility];  Service: Cardiovascular;  Laterality: N/A;  . ELECTROPHYSIOLOGY STUDY N/A 10/13/2014   Procedure: ELECTROPHYSIOLOGY STUDY;  Surgeon: Evans Lance, MD;  Location: [facility] CATH LAB;  Service: Cardiovascular;  Laterality: N/A;  . EYE SURGERY Bilateral 2011   cataract   . PROSTATECTOMY    . SUPRAVENTRICULAR TACHYCARDIA ABLATION N/A 10/13/2014   Procedure: SUPRAVENTRICULAR TACHYCARDIA ABLATION;  Surgeon: Evans Lance, MD;  Location: [facility] CATH LAB;  Service: Cardiovascular;  Laterality: N/A;  . TRANSCAROTID ARTERY REVASCULARIZATION (TCAR) Left 08/01/2020  . TRANSCAROTID ARTERY REVASCULARIZATION Left 08/01/2020   Procedure: LEFT TRANSCAROTID ARTERY REVASCULARIZATION;  Surgeon: Elam Dutch, MD;  Location: [facility];  Service: Vascular;  Laterality: Left;  . ULTRASOUND GUIDANCE FOR VASCULAR ACCESS Right 08/01/2020  Procedure: ULTRASOUND GUIDANCE FOR VASCULAR ACCESS;  Surgeon: Elam Dutch, MD;  Location: [facility] OR;  Service: Vascular;  Laterality: Right;   Social History   Tobacco Use  . Smoking status: Never Smoker  . Smokeless tobacco: Never Used  Substance Use Topics  . Alcohol use: No   ROS  Review of Systems  Constitutional:  Negative for malaise/fatigue and weight gain.  Cardiovascular: Negative for chest pain, claudication, near-syncope, orthopnea, palpitations, paroxysmal nocturnal dyspnea and syncope.  Respiratory: Positive for shortness of breath.   Hematologic/Lymphatic: Does not bruise/bleed easily.  Gastrointestinal: Negative for melena.  Neurological: Negative for dizziness and weakness.   Objective  Blood pressure (!) 162/67, pulse (!) 54, temperature 98 F (36.7 C), height 5\' 7"  (1.702 m), weight 159 lb (72.1 kg), SpO2 99 %.  Vitals with BMI 10/31/2020 10/31/2020 08/25/2020  Height - 5\' 7"  -  Weight - 159 lbs -  BMI - 36.6 -  Systolic 294 765 465  Diastolic 67 78 82  Pulse 54 60 -     Physical Exam Neck:     Thyroid: No thyromegaly.  Cardiovascular:     Rate and Rhythm: Normal rate and regular rhythm.     Pulses: Intact distal pulses.     Heart sounds: Normal heart sounds. No murmur heard. No gallop.      Comments: No JVD. Left carotid endarterectomy.  1+ pitting edema left leg. Trace edema right leg.  Pulmonary:     Effort: Pulmonary effort is normal.     Breath sounds: Normal breath sounds. No wheezing, rhonchi or rales.  Musculoskeletal:     Cervical back: Neck supple.     Right lower leg: Edema (trace) present.     Left lower leg: Edema (1+ pitting ) present.  Skin:    General: Skin is warm and dry.    Laboratory examination:   Recent Labs    06/10/20 1637 06/30/20 0532 07/07/20 1026 07/26/20 1513 08/01/20 1419 08/02/20 0500  NA 133*   < > 131* 135  --  132*  K 5.1   < > 4.7 4.4  --  4.3  CL 99   < > 97 103  --  104  CO2 23   < > 27 23  --  22  GLUCOSE 87   < > 112* 103*  --  98  BUN 18   < > 22 16  --  13  CREATININE 1.07   < > 1.13 1.00 0.96 1.02  CALCIUM 9.5   < > 9.6 9.6  --  7.7*  GFRNONAA 63   < >  --  >60 >60 >60  GFRAA 73  --   --   --   --   --    < > = values in this interval not displayed.   CrCl cannot be calculated (Patient's most recent lab result is  older than the maximum 21 days allowed.).  CMP Latest Ref Rng & Units 08/02/2020 08/01/2020 07/26/2020  Glucose 70 - 99 mg/dL 98 - 103(H)  BUN 8 - 23 mg/dL 13 - 16  Creatinine 0.61 - 1.24 mg/dL 1.02 0.96 1.00  Sodium 135 - 145 mmol/L 132(L) - 135  Potassium 3.5 - 5.1 mmol/L 4.3 - 4.4  Chloride 98 - 111 mmol/L 104 - 103  CO2 22 - 32 mmol/L 22 - 23  Calcium 8.9 - 10.3 mg/dL 7.7(L) - 9.6  Total Protein 6.5 - 8.1 g/dL - - 7.4  Total Bilirubin 0.3 -  1.2 mg/dL - - 1.1  Alkaline Phos 38 - 126 U/L - - 63  AST 15 - 41 U/L - - 26  ALT 0 - 44 U/L - - 23   CBC Latest Ref Rng & Units 08/02/2020 08/01/2020 07/26/2020  WBC 4.0 - 10.5 K/uL 9.0 11.5(H) 8.6  Hemoglobin 13.0 - 17.0 g/dL 10.2(L) 13.2 12.8(L)  Hematocrit 39.0 - 52.0 % 30.1(L) 39.4 39.8  Platelets 150 - 400 K/uL 220 255 296   Lipid Panel  No results found for: CHOL, TRIG, HDL, CHOLHDL, VLDL, LDLCALC, LDLDIRECT HEMOGLOBIN A1C No results found for: HGBA1C, MPG TSH Recent Labs    06/30/20 1406  TSH 1.774    External labs 07/06/2019:   Cholesterol, total 189.000 01/25/2020 HDL 46.000 01/25/2020 LDL-C 123.000 01/25/2020 Triglycerides 108.000 01/25/2020  A1C 6.300 01/25/2020 TSH 4.640 01/25/2020   Hemoglobin 12.800 01/25/2020  Creatinine, Serum 1.150 01/25/2020 Potassium 4.100 01/25/2020 Magnesium N/D ALT (SGPT) 20.000 01/25/2020   Cholesterol, total 281.000 07/06/2019 HDL 42.000 07/06/2019 LDL 206.000 07/06/2019 Triglycerides 168.000 07/06/2019 A1C 5.800 07/06/2019 Hemoglobin 12.800 07/06/2019 Creatinine, Serum 1.140 07/06/2019 Potassium 4.200 07/06/2019 Magnesium N/D ALT (SGPT) 12.000 07/06/2019  Medications and allergies   Allergies  Allergen Reactions  . Amoxicillin Diarrhea    GI upset: Bleeding diarrhea  Other reaction(s): GI Bleed  . Ibuprofen Diarrhea    Bloody diarrhea   . Penicillins Diarrhea    Bloody diarrhea   . Ace Inhibitors Swelling    Face swelling   . Amlodipine Swelling  . Spironolactone      Hyponatremia   . Atorvastatin     Weakness   . Azithromycin     Stomach ache   . Diltiazem Nausea Only    Upset stomach per patient  . Hydrochlorothiazide Other (See Comments)    Photosensitivity    Current Outpatient Medications on File Prior to Visit  Medication Sig Dispense Refill  . acebutolol (SECTRAL) 200 MG capsule Take 1 capsule (200 mg total) by mouth 2 (two) times daily. 60 capsule 3  . ALPRAZolam (XANAX) 0.25 MG tablet Take 1 tablet (0.25 mg total) by mouth at bedtime as needed for sleep. 30 tablet 0  . apixaban (ELIQUIS) 5 MG TABS tablet Take 1 tablet (5 mg total) by mouth 2 (two) times daily. 180 tablet 2  . aspirin EC 81 MG tablet Take 1 tablet (81 mg total) by mouth daily. 70 tablet 1  . b complex vitamins tablet Take 1 tablet by mouth daily.     . Cholecalciferol (VITAMIN D) 125 MCG (5000 UT) CAPS Take 5,000 Units by mouth daily.     . Coenzyme Q10 (CO Q-10) 200 MG CAPS Take 200 mg by mouth daily.     . Cyanocobalamin (B-12) 5000 MCG CAPS Take 5,000 mcg by mouth daily.    . Ferrous Sulfate 27 MG TABS Take 27 mg by mouth daily.    . folic acid (FOLVITE) 742 MCG tablet Take 800 mcg by mouth daily.    Marland Kitchen L-Arginine 1000 MG TABS Take 1,000 mg by mouth daily.     . LUTEIN PO Take 1 tablet by mouth daily.     . Magnesium 250 MG TABS Take 500 mg by mouth daily.     . melatonin 5 MG TABS Take 1 tablet by mouth daily as needed.    . Probiotic Product (ALIGN PO) Take 1 capsule by mouth daily.     . PSYLLIUM PO Take 1 tablet by mouth daily.     . rosuvastatin (  CRESTOR) 10 MG tablet Take 10 mg by mouth daily.      . valsartan (DIOVAN) 160 MG tablet Take 1 tablet (160 mg total) by mouth daily. 90 tablet 3  . Vitamin A 2400 MCG (8000 UT) CAPS Take 8,000 Units by mouth daily.    . vitamin C (ASCORBIC ACID) 500 MG tablet Take 500 mg by mouth daily.    Marland Kitchen zinc gluconate 50 MG tablet Take 50 mg by mouth daily.    Marland Kitchen omega-3 acid ethyl esters (LOVAZA) 1 g capsule Take 1 g by mouth daily.   (Patient not taking: Reported on 10/31/2020)    . PAPAYA ENZYME PO Take 1 tablet by mouth daily. (Patient not taking: Reported on 10/31/2020)     No current facility-administered medications on file prior to visit.    Radiology:  No results found.   Chest x-ray 06/30/2020: Lungs are clear.  No pleural effusion or pneumothorax. The heart is normal in size.  Thoracic aortic atherosclerosis. Degenerative changes of the visualized thoracolumbar spine.  Cardiac Studies:  Coronary Angiogram  [2011]: Performed in [place], 70% stenosis in the circumflex coronary artery. Was performed as a part of workup for EP study for the SVT.  Nuclear stress test  [04/26/2010]: Myocardial perfusion scan 04/26/2010: Normal perfusion, EF 77%.  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  Echocardiogram 06/03/2020: Left ventricle cavity is normal in size. Moderate asymmetric hypertrophy of the left ventricle. Basal septum measures at 2.0 cm. Mild LVOT peak gradient 5 mmHg without SAM or LVOT obstruction. Normal wall motion. LVEF 55-60%. Indeterminate diastolic dysfunction. Left atrial cavity is mildly dilated. Trileaflet aortic valve. Trace aortic stenosis. Moderate (Grade II) aortic regurgitation. Mild calcification, No significant stenosis. Moderate (Grade II) mitral regurgitation. Mild tricuspid regurgitation. Estimated pulmonary artery systolic pressure 30 mmHg.  No significant change compared to previous study in 2018.  Carotid artery duplex  06/03/2020: Stenosis in the right internal carotid artery (16-49%). Stenosis in the left internal carotid artery (>=70%). The left PSV internal/common carotid artery ratio is consistent with a stenosis of >70%. The duplex suggest near subtotal occlusion with soft plaque at the level of the proximal left ICA.  Recommend different modality  imaging to confirm severity of the disease.  Follow up study in six months is appropriate if clinically indicated.  CT angio neck 06/28/2020: 1.  Noncalcified atherosclerotic disease in the bifurcation of the right ICA <50% stenosis. 2. 80% stenosis of the proximal left internal carotid artery secondary to noncalcified plaque. 3. 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 4. Aortic Atherosclerosis (ICD10-I70.0).  CT angio head W or WO Contrast 07/12/2020:  No acute intracranial abnormality. Mild chronic microvascular ischemic changes. Chronic right caudate infarct. Short segment marked stenosis of the distal intracranial right vertebral artery.  Transcarotid artery revascularization 08/01/2020: Left transcarotid revascularization (TCAR), stent left internal carotid artery  Carotid Duplex 08/25/2020:  Right Carotid: Velocities in the right ICA are consistent with a 1-39% stenosis. Left Carotid: The ECA appears >50% stenosed. Patent stent with no stenosis noted. Vertebrals: Bilateral vertebral arteries demonstrate antegrade flow. Subclavians: Normal flow hemodynamics were seen in bilateral subclavian arteries.  Lower Venous DVT Study 10/24/2020:  RIGHT:  - No evidence of common femoral vein obstruction.  LEFT:  - Findings consistent with age indeterminate  deep vein thrombosis involving the left femoral vein.  - Findings consistent with age indeterminate superficial vein thrombosis involving the left small sahenous vein.  - No cystic structure found in the popliteal fossa.  EKG:    EKG 10/31/20: Sinus bradycardia a 53 bpm with first-degree AV block, left atrial enlargement.  Left axis deviation, left anterior fascicular block.  Poor R wave progression, cannot exclude anteroseptal infarct old. LVH with early repolarization.   EKG 07/11/2020: Sinus rhythm at a  rate of 70 bpm with first-degree AV block, left atrial enlargement.  Left axis deviation, left anterior fascicular block.  Poor R wave progression, cannot exclude anteroseptal infarct old.  Compared to EKG/16/2021, no significant change.  Assessment     ICD-10-CM   1. Shortness of breath  R06.02 EKG 12-Lead    Brain natriuretic peptide    Basic metabolic panel    CBC    CT ANGIO CHEST PE W OR WO CONTRAST  2. Deep vein thrombosis (DVT) of left lower extremity, unspecified chronicity, unspecified vein (HCC)  I82.402 CT ANGIO CHEST PE W OR WO CONTRAST  3. Essential hypertension, benign  I10 hydrALAZINE (APRESOLINE) 50 MG tablet    CHA2DS2-VASc Score is 4.  Yearly risk of stroke: 4.8% (A, HTN, Vasc Dz).  Score of 1=0.6; 2=2.2; 3=3.2; 4=4.8; 5=7.2; 6=9.8; 7=>9.8) -(CHF; HTN; vasc disease DM,  Male = 1; Age <65 =0; 65-74 = 1,  >75 =2; stroke/embolism= 2).    Meds ordered this encounter  Medications  . hydrALAZINE (APRESOLINE) 50 MG tablet    Sig: Take 1 tablet (50 mg total) by mouth in the morning, at noon, in the evening, and at bedtime. Take extra one if BP >140/70    Dispense:  270 tablet    Refill:  3   Medications Discontinued During This Encounter  Medication Reason  . Chromium 1000 MCG TABS Completed Course  . hydrALAZINE (APRESOLINE) 50 MG tablet Reorder    Recommendations:   Dalton Cardenas  is a 87 y.o. 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 Cristopher Peru and recommended either ablation or continued medical therapy.  Patient with recurrence of atypical atrial flutter 06/30/2020. Patient underwent left transcarotid revascularization (TCAR), stent left internal carotid artery by Dr. Ruta Hinds 08/01/2020.   Patient presents for urgent visit with complaints of shortness of breath, his wife is present at bedside. Patient's blood pressure remains  uncontrolled. Will increase hydralazine to 50 mg 4 times daily. Patient has been sensitive to medications in the past, he will notify our office if he experiences issues with increased hydralazine dosing. I suspect his left leg pain is contributing to elevated blood pressure. Patient's EKG is without signs of ischemia or injury. In regard to shortness of breath suspect is it multifactorial, including secondary to pain as the episode occurred when he moved his leg. Will obtain chest CTA to rule our PE in view of current DVT. Will also obtain CBC, BNP, BMP to further evaluate etiology. Of note patient remains notably anxious about his health, I reassured him he is already on appropriate medical therapy with [facility] if he were to have PE. If BP continues to remain elevated, may consider increasing valsartan dose to 320 mg daily.   Will follow closely, office visit in 4 weeks.    Alethia Berthold, PA-C 10/31/2020, 4:48 PM Office: [phone]

## 2020-11-02 ENCOUNTER — Other Ambulatory Visit: Payer: Self-pay | Admitting: Cardiology

## 2020-11-02 DIAGNOSIS — I10 Essential (primary) hypertension: Secondary | ICD-10-CM

## 2020-11-02 DIAGNOSIS — E7800 Pure hypercholesterolemia, unspecified: Secondary | ICD-10-CM | POA: Diagnosis not present

## 2020-11-02 DIAGNOSIS — K219 Gastro-esophageal reflux disease without esophagitis: Secondary | ICD-10-CM | POA: Diagnosis not present

## 2020-11-03 ENCOUNTER — Other Ambulatory Visit: Payer: Self-pay

## 2020-11-03 ENCOUNTER — Ambulatory Visit (HOSPITAL_COMMUNITY)
Admission: RE | Admit: 2020-11-03 | Discharge: 2020-11-03 | Disposition: A | Discharge status: Home / Self Care | Payer: Medicare Other | Source: Ambulatory Visit | Attending: Pulmonary Disease | Admitting: Pulmonary Disease

## 2020-11-03 DIAGNOSIS — I82412 Acute embolism and thrombosis of left femoral vein: Secondary | ICD-10-CM | POA: Diagnosis not present

## 2020-11-05 DIAGNOSIS — G4733 Obstructive sleep apnea (adult) (pediatric): Secondary | ICD-10-CM | POA: Diagnosis not present

## 2020-11-06 NOTE — Progress Notes (Signed)
HEMATOLOGY/ONCOLOGY CONSULTATION NOTE  Date of Service: 11/06/2020  Patient Care Team: Aretta Nip, MD as PCP - General (Family Medicine)  CHIEF COMPLAINTS/PURPOSE OF CONSULTATION:  DVT  HISTORY OF PRESENTING ILLNESS:   Dalton Cardenas is a wonderful 87 y.o. male who has been referred to [place] by Dr. Elsworth Soho for evaluation and management of DVT occurring while on ELiquis.   Patient has a history of hypertension, dyslipidemia, coronary artery disease status post cath and 2011, remote history of prostate cancer status post prostatectomy, sleep apnea on CPAP, atrial flutter on chronic Eliquis, previous history of SVT/multifocal atrial tachycardia.  Patient was noted to have greater than 80% left internal carotid artery stenosis after follow-up for an asymptomatic carotid bruit.  No previous history of TIA or strokes.  He underwent a left transcarotid artery revascularization on 08/01/2020 by Dr. Oneida Alar with stenting and was on aspirin plus Plavix plus Eliquis postprocedure.  The Plavix was discontinued 2 months after the procedure.  Patient presented on 10/24/2020 with left lower extremity swelling and had a venous ultrasound which showed an age-indeterminate DVT involving the left femoral vein and age indeterminate superficial venous thrombosis involving the left small saphenous vein.  His Eliquis was increased to 10 mg p.o. twice daily for a week.  He had a repeat venous ultrasound on 11/03/2020 which showed chronic superficial venous thrombosis involving the left small saphenous vein and no evidence of DVT in his left lower extremity.  Mid femoral vein thrombus was noted to have resolved.  Patient notes that a few days prior to the leg swelling he also had a minor fall and did bump his left knee which has been swollen and that he has follow-up with orthopedics to evaluate this further.  Patient reports no previous history of venous thromboembolism or unusual arterial thrombosis.  No  family history of VTE.  On review of systems, pt reports some shortness of breath and did see his cardiologist on 10/31/2020 and this was thought to be related to elevated blood pressures hypertensives were adjusted.  There was also an thought that there was a significant element of anxiety involved .with cardiology for further evaluation management of shortness of breath . Patient was seen by Dr. Elsworth Soho from pulmonary and no concern at that time was noted for PE. Patient did subsequently have a CTA of the chest after our visit ordered by cardiology on 2/25 which showed no evidence of PE.  Patient denies any weight loss or other new focal symptoms suggestive of malignancy.  No new urinary symptoms.  No bowel changes. Has been compliant with his CPAP machine for his sleep apnea.   MEDICAL HISTORY:  Past Medical History:  Diagnosis Date  . Coronary artery disease    60-70% circumflex (cath in [place] 08/2010).  Marland Kitchen Dysrhythmia    A history of A flutter and tachycardia (SVT)  . GERD (gastroesophageal reflux disease)   . Hypercholesterolemia   . Hypertension   . Prostate cancer [facility])    status post prostatectomy  . Sleep apnea   . 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 SVT (supraventricular tachycardia) ([place])    a. Holter monitoring 2011 - AV node reentry, multifocal atrial tachycardia, and resting bradycardia. b. previously tx with [place] in [place].    SURGICAL HISTORY: Past Surgical History:  Procedure Laterality Date  . ABLATION OF DYSRHYTHMIC FOCUS  10/13/2014   SVT  by Dr Lovena Le  . ANAL FISSURE REPAIR    . CARDIOVERSION N/A 07/01/2020   Procedure: CARDIOVERSION;  Surgeon: Adrian Prows, MD;  Location: [place];  Service: Cardiovascular;  Laterality: N/A;  . ELECTROPHYSIOLOGY STUDY N/A 10/13/2014   Procedure: ELECTROPHYSIOLOGY STUDY;  Surgeon: Evans Lance, MD;  Location: [facility] CATH LAB;  Service:  Cardiovascular;  Laterality: N/A;  . EYE SURGERY Bilateral 2011   cataract   . PROSTATECTOMY    . SUPRAVENTRICULAR TACHYCARDIA ABLATION N/A 10/13/2014   Procedure: SUPRAVENTRICULAR TACHYCARDIA ABLATION;  Surgeon: Evans Lance, MD;  Location: [facility] CATH LAB;  Service: Cardiovascular;  Laterality: N/A;  . TRANSCAROTID ARTERY REVASCULARIZATION (TCAR) Left 08/01/2020  . TRANSCAROTID ARTERY REVASCULARIZATION Left 08/01/2020   Procedure: LEFT TRANSCAROTID ARTERY REVASCULARIZATION;  Surgeon: Elam Dutch, MD;  Location: [place];  Service: Vascular;  Laterality: Left;  . ULTRASOUND GUIDANCE FOR VASCULAR ACCESS Right 08/01/2020   Procedure: ULTRASOUND GUIDANCE FOR VASCULAR ACCESS;  Surgeon: Elam Dutch, MD;  [facility] OR;  Service: Vascular;  Laterality: Right;    SOCIAL HISTORY: Social History   Socioeconomic History  . Marital status: Married    Spouse name: Not on file  . Number of children: 4  . Years of education: Not on file  . Highest education level: Not on file  Occupational History  . Occupation: Retired  Tobacco Use  . Smoking status: Never Smoker  . Smokeless tobacco: Never Used  Vaping Use  . Vaping Use: Never used  Substance and Sexual Activity  . Alcohol use: No  . Drug use: No  . Sexual activity: Not Currently  Other Topics Concern  . Not on file  Social History Narrative   Regular exercise: Yes   Social Determinants of Health   Financial Resource Strain: Not on file  Food Insecurity: Not on file  Transportation Needs: Not on file  Physical Activity: Not on file  Stress: Not on file  Social Connections: Not on file  Intimate Partner Violence: Not on file    FAMILY HISTORY: Family History  Problem Relation Age of Onset  . Hypertension Mother   . Heart disease Father   . Hypertension Sister   . Hypertension Brother   . Ulcers Sister   . Hypertension Sister   . Diabetes Neg Hx   . Coronary artery disease Neg Hx     ALLERGIES:  is allergic  to amoxicillin, ibuprofen, penicillins, ace inhibitors, amlodipine, spironolactone, atorvastatin, azithromycin, diltiazem, and hydrochlorothiazide.  MEDICATIONS:  Current Outpatient Medications  Medication Sig Dispense Refill  . acebutolol (SECTRAL) 200 MG capsule Take 1 capsule (200 mg total) by mouth 2 (two) times daily. 60 capsule 3  . ALPRAZolam (XANAX) 0.25 MG tablet Take 1 tablet (0.25 mg total) by mouth at bedtime as needed for sleep. 30 tablet 0  . apixaban (ELIQUIS) 5 MG TABS tablet Take 1 tablet (5 mg total) by mouth 2 (two) times daily. 180 tablet 2  . aspirin EC 81 MG tablet Take 1 tablet (81 mg total) by mouth daily. 70 tablet 1  . b complex vitamins tablet Take 1 tablet by mouth daily.     . Cholecalciferol (VITAMIN D) 125 MCG (5000 UT) CAPS Take 5,000 Units by mouth daily.     . Coenzyme Q10 (CO Q-10) 200 MG CAPS Take 200 mg by mouth daily.     . Cyanocobalamin (B-12) 5000 MCG CAPS Take 5,000 mcg by mouth daily.    . Ferrous Sulfate 27 MG TABS Take 27 mg  by mouth daily.    . folic acid (FOLVITE) 793 MCG tablet Take 800 mcg by mouth daily.    . hydrALAZINE (APRESOLINE) 25 MG tablet TAKE 1 TABLET BY MOUTH THREE TIMES DAILY AS NEEDED. TAKE 1 TAB IF BP >140/70, 2 TAB IF SBP >160 MM HG 90 tablet 2  . hydrALAZINE (APRESOLINE) 50 MG tablet Take 1 tablet (50 mg total) by mouth in the morning, at noon, in the evening, and at bedtime. Take extra one if BP >140/70 270 tablet 3  . L-Arginine 1000 MG TABS Take 1,000 mg by mouth daily.     . LUTEIN PO Take 1 tablet by mouth daily.     . Magnesium 250 MG TABS Take 500 mg by mouth daily.     . melatonin 5 MG TABS Take 1 tablet by mouth daily as needed.    Marland Kitchen omega-3 acid ethyl esters (LOVAZA) 1 g capsule Take 1 g by mouth daily.  (Patient not taking: Reported on 10/31/2020)    . PAPAYA ENZYME PO Take 1 tablet by mouth daily. (Patient not taking: Reported on 10/31/2020)    . Probiotic Product (ALIGN PO) Take 1 capsule by mouth daily.     .  PSYLLIUM PO Take 1 tablet by mouth daily.     . rosuvastatin (CRESTOR) 10 MG tablet Take 10 mg by mouth daily.      . valsartan (DIOVAN) 160 MG tablet Take 1 tablet (160 mg total) by mouth daily. 90 tablet 3  . Vitamin A 2400 MCG (8000 UT) CAPS Take 8,000 Units by mouth daily.    . vitamin C (ASCORBIC ACID) 500 MG tablet Take 500 mg by mouth daily.    Marland Kitchen zinc gluconate 50 MG tablet Take 50 mg by mouth daily.     No current facility-administered medications for this visit.    REVIEW OF SYSTEMS:   10 Point review of Systems was done is negative except as noted above.  PHYSICAL EXAMINATION: ECOG PERFORMANCE STATUS: 1 - Symptomatic but completely ambulatory  . Vitals:   11/08/20 1312 11/08/20 1321  BP: (!) 188/61 (!) 155/61  Pulse: 61   Resp: 17   Temp: 98.1 F (36.7 C)   SpO2: 100%    Filed Weights   11/08/20 1312  Weight: 158 lb 1.6 oz (71.7 kg)   .Body mass index is 24.76 kg/m.  NAD GENERAL:alert, in no acute distress and comfortable SKIN: no acute rashes, no significant lesions EYES: conjunctiva are pink and non-injected, sclera anicteric OROPHARYNX: MMM, no exudates, no oropharyngeal erythema or ulceration NECK: supple, no JVD LYMPH:  no palpable lymphadenopathy in the cervical, axillary or inguinal regions LUNGS: clear to auscultation b/l with normal respiratory effort HEART: regular rate & rhythm ABDOMEN:  normoactive bowel sounds , non tender, not distended. Extremity: 1+ left lower extremity swelling.  Some bruising noted over the left medial thigh.  Significant tenderness to palpation at this time. PSYCH: alert & oriented x 3 with fluent speech NEURO: no focal motor/sensory deficits  LABORATORY DATA:  I have reviewed the data as listed  . CBC Latest Ref Rng & Units 11/08/2020 08/02/2020 08/01/2020  WBC 4.0 - 10.5 K/uL 8.2 9.0 11.5(H)  Hemoglobin 13.0 - 17.0 g/dL 11.8(L) 10.2(L) 13.2  Hematocrit 39.0 - 52.0 % 36.1(L) 30.1(L) 39.4  Platelets 150 - 400 K/uL 267  220 255    . CMP Latest Ref Rng & Units 11/08/2020 08/02/2020 08/01/2020  Glucose 70 - 99 mg/dL 102(H) 98 -  BUN 8 -  23 mg/dL 20 13 -  Creatinine 0.61 - 1.24 mg/dL 1.04 1.02 0.96  Sodium 135 - 145 mmol/L 136 132(L) -  Potassium 3.5 - 5.1 mmol/L 4.8 4.3 -  Chloride 98 - 111 mmol/L 104 104 -  CO2 22 - 32 mmol/L 24 22 -  Calcium 8.9 - 10.3 mg/dL 9.4 7.7(L) -  Total Protein 6.5 - 8.1 g/dL 7.5 - -  Total Bilirubin 0.3 - 1.2 mg/dL 0.9 - -  Alkaline Phos 38 - 126 U/L 71 - -  AST 15 - 41 U/L 23 - -  ALT 0 - 44 U/L 21 - -     RADIOGRAPHIC STUDIES: I have personally reviewed the radiological images as listed and agreed with the findings in the report. VAS [place] LOWER EXTREMITY VENOUS (DVT)  Result Date: 11/03/2020  Lower Venous DVT Study Indications: Follow up.  Risk Factors: DVT Mid FV and SSV thrombus 10/24/20. Performing Technologist: Ronal Fear RVS, RCS  Examination Guidelines: A complete evaluation includes B-mode imaging, spectral Doppler, color Doppler, and power Doppler as needed of all accessible portions of each vessel. Bilateral testing is considered an integral part of a complete examination. Limited examinations for reoccurring indications may be performed as noted. The reflux portion of the exam is performed with the patient in reverse Trendelenburg.  +---------+---------------+---------+-----------+----------+--------------+ LEFT     CompressibilityPhasicitySpontaneityPropertiesThrombus Aging +---------+---------------+---------+-----------+----------+--------------+ CFV      Full           Yes      Yes                                 +---------+---------------+---------+-----------+----------+--------------+ FV Prox  Full           Yes      Yes                                 +---------+---------------+---------+-----------+----------+--------------+ FV Mid   Full           Yes      Yes                                  +---------+---------------+---------+-----------+----------+--------------+ FV DistalFull           Yes      Yes                                 +---------+---------------+---------+-----------+----------+--------------+ POP      Full           Yes      Yes                                 +---------+---------------+---------+-----------+----------+--------------+ PTV      Full                    Yes                                 +---------+---------------+---------+-----------+----------+--------------+ PERO     Full                    Yes                                 +---------+---------------+---------+-----------+----------+--------------+  GSV      Full           Yes      Yes                                 +---------+---------------+---------+-----------+----------+--------------+ SSV      Partial                                      Chronic        +---------+---------------+---------+-----------+----------+--------------+     Summary: LEFT: - Findings consistent with chronic superficial vein thrombosis involving the left small saphenous vein. - There is no evidence of deep vein thrombosis in the lower extremity.  - Previous mid FV thrombus appears resolved. - Chronic changes noted in the SSV.  *See table(s) above for measurements and observations. Electronically signed by Ruta Hinds MD on 11/03/2020 at 3:42:41 PM.    Final    VAS [place] LOWER EXTREMITY VENOUS (DVT)  Result Date: 10/25/2020  Lower Venous DVT Study Indications: Swelling.  Comparison Study: None Performing Technologist: Ivan Croft  Examination Guidelines: A complete evaluation includes B-mode imaging, spectral Doppler, color Doppler, and power Doppler as needed of all accessible portions of each vessel. Bilateral testing is considered an integral part of a complete examination. Limited examinations for reoccurring indications may be performed as noted. The reflux portion of the exam is performed  with the patient in reverse Trendelenburg.  +-----+---------------+---------+-----------+----------+--------------+ RIGHTCompressibilityPhasicitySpontaneityPropertiesThrombus Aging +-----+---------------+---------+-----------+----------+--------------+ CFV  Full           Yes      Yes                                 +-----+---------------+---------+-----------+----------+--------------+   +---------+---------------+---------+-----------+----------+--------------+ LEFT     CompressibilityPhasicitySpontaneityPropertiesThrombus Aging +---------+---------------+---------+-----------+----------+--------------+ CFV      Full           Yes      Yes                                 +---------+---------------+---------+-----------+----------+--------------+ SFJ      Full                                                        +---------+---------------+---------+-----------+----------+--------------+ FV Prox  Full                                                        +---------+---------------+---------+-----------+----------+--------------+ FV Mid   Partial        Yes      Yes                                 +---------+---------------+---------+-----------+----------+--------------+ FV DistalPartial                                                     +---------+---------------+---------+-----------+----------+--------------+  PFV      Full                                                        +---------+---------------+---------+-----------+----------+--------------+ POP      Full           Yes      Yes                                 +---------+---------------+---------+-----------+----------+--------------+ PTV      Full                                                        +---------+---------------+---------+-----------+----------+--------------+ PERO     Full                                                         +---------+---------------+---------+-----------+----------+--------------+ GSV      Full                                                        +---------+---------------+---------+-----------+----------+--------------+ SSV      None                                                        +---------+---------------+---------+-----------+----------+--------------+     Summary: RIGHT: - No evidence of common femoral vein obstruction.  LEFT: - Findings consistent with age indeterminate deep vein thrombosis involving the left femoral vein. - Findings consistent with age indeterminate superficial vein thrombosis involving the left small sahenous vein. - No cystic structure found in the popliteal fossa.  - Results called to Dr. Rip Harbour (10/24/2020)  *See table(s) above for measurements and observations. Electronically signed by Monica Martinez MD on 10/25/2020 at 1:59:14 PM.    Final     ASSESSMENT & PLAN:   Mr. Hangartner is a very pleasant 87 year old gentleman with multiple medical issues including hypertension, dyslipidemia, sleep apnea on CPAP, dysrhythmias, coronary artery disease status post stenting internal carotid artery stenting on 08/01/2020 with  #1 Age-indeterminate left mid femoral vein DVT on 10/28/2020  this DVT occurred while on aspirin plus Eliquis. In the context of some knee injury and left knee swelling of unclear etiology which might serve as a local risk factor for inflammation. Patient was also noted to have superficial venous thrombosis phlebitis which was read age-indeterminate possibly chronic.  This might suggest the presence of possible varicosities and local venous risk factors causing venous blood pooling.  Patient did have a repeat venous ultrasound of the left lower extremity after about 10 days and after having had his Eliquis dose  increased to 10 mg p.o. twice daily and there has been resolution of his left mid femoral vein DVT and improvement in his  symptoms.  PLAN: -We discussed the circumstances of his left lower extremity femoral vein DVT in details. -He will be seeing orthopedics to evaluate his left knee swelling and rule out gout or other potential infectious/inflammatory etiologies which could serve as a local source of inflammation. -Unlikely represent Eliquis failure.  His DVT has resolved with continued Eliquis including a repeat loading dose.  Patient is also already on aspirin.  Reasonable to continue his Eliquis milligrams p.o. twice daily at this time. -Hypercoagulable work-up labs were sent today. -His CBC shows no polycythemia or thrombocytosis. -Antiphospholipid antibody panel including anticardiolipin antibody, beta-2 glycoprotein antibody lupus anticoagulant were all unremarkable -D-dimer mildly elevated at 1.3 might be useful to track in the future with variable symptomatology. 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 -Would get ultrasound IVC and iliacs and ultrasound for venous reflux studies to rule out may Thurner syndrome and significant varicose veins/venous reflux as local risk factors for DVT. -We discussed risk mitigation strategies including maintaining adequate hydration, staying physically active, optimizing sleep apnea management, using knee-high compression socks.   FOLLOW UP: Labs today [place] iliac veins and US venous reflux studies in 4 weeks RTC with Dr Irene Limbo in 6 weeks  All of the patients questions were answered with apparent satisfaction. The patient knows to call the clinic with any problems, questions or concerns.  I spent 40 minutes counseling the patient face to face. The total time spent in the appointment was 60 minutes and more than 50% was on counseling and direct patient cares.    Dalton Lone MD [place] AAHIVMS [facility] Hematology/Oncology Physician [facility]  (Office):        [phone] (Work cell):  [phone] (Fax):           [phone]  11/06/2020 10:18 AM  I, Dalton Cardenas, am acting as scribe for Dr. [place] Lone, MD.   .I have reviewed the above documentation for accuracy and completeness, and I agree with the above.  Dalton Genera MD

## 2020-11-07 ENCOUNTER — Telehealth: Payer: Self-pay

## 2020-11-07 NOTE — Progress Notes (Signed)
Called and scheduled office visit with Dr Elsworth Soho for Thursday 11/24/2020 at [place] at the [facility] office. Patient agreeable to time, date and location. Patient verified he is aware of [place] results per Dr Elsworth Soho. Nothing further needed at this time.

## 2020-11-07 NOTE — Telephone Encounter (Signed)
Called and scheduled office visit with Dr Elsworth Soho for Thursday 11/24/2020 at [place] at the [facility] office. Patient agreeable to time, date and location. Patient verified he is aware of [place] results per Dr Elsworth Soho. Nothing further needed at this time.

## 2020-11-07 NOTE — Telephone Encounter (Signed)
-----   Message from Rigoberto Noel, MD sent at 11/07/2020  8:38 AM EST ----- Pt aware of results Please arrange routine OV FU in next few weeks

## 2020-11-08 ENCOUNTER — Other Ambulatory Visit: Payer: Self-pay

## 2020-11-08 ENCOUNTER — Telehealth: Payer: Self-pay | Admitting: Hematology

## 2020-11-08 ENCOUNTER — Inpatient Hospital Stay: Payer: Medicare Other

## 2020-11-08 ENCOUNTER — Inpatient Hospital Stay: Payer: Medicare Other | Attending: Hematology | Admitting: Hematology

## 2020-11-08 VITALS — BP 155/61 | HR 61 | Temp 98.1°F | Resp 17 | Ht 67.0 in | Wt 158.1 lb

## 2020-11-08 DIAGNOSIS — G4730 Sleep apnea, unspecified: Secondary | ICD-10-CM | POA: Insufficient documentation

## 2020-11-08 DIAGNOSIS — E7800 Pure hypercholesterolemia, unspecified: Secondary | ICD-10-CM | POA: Diagnosis not present

## 2020-11-08 DIAGNOSIS — Z8546 Personal history of malignant neoplasm of prostate: Secondary | ICD-10-CM | POA: Insufficient documentation

## 2020-11-08 DIAGNOSIS — K219 Gastro-esophageal reflux disease without esophagitis: Secondary | ICD-10-CM | POA: Diagnosis not present

## 2020-11-08 DIAGNOSIS — I82409 Acute embolism and thrombosis of unspecified deep veins of unspecified lower extremity: Secondary | ICD-10-CM

## 2020-11-08 DIAGNOSIS — M25562 Pain in left knee: Secondary | ICD-10-CM | POA: Diagnosis not present

## 2020-11-08 DIAGNOSIS — D6859 Other primary thrombophilia: Secondary | ICD-10-CM

## 2020-11-08 DIAGNOSIS — I2510 Atherosclerotic heart disease of native coronary artery without angina pectoris: Secondary | ICD-10-CM | POA: Insufficient documentation

## 2020-11-08 DIAGNOSIS — I10 Essential (primary) hypertension: Secondary | ICD-10-CM | POA: Insufficient documentation

## 2020-11-08 DIAGNOSIS — E785 Hyperlipidemia, unspecified: Secondary | ICD-10-CM | POA: Diagnosis not present

## 2020-11-08 DIAGNOSIS — Z79899 Other long term (current) drug therapy: Secondary | ICD-10-CM | POA: Insufficient documentation

## 2020-11-08 DIAGNOSIS — I82412 Acute embolism and thrombosis of left femoral vein: Secondary | ICD-10-CM

## 2020-11-08 DIAGNOSIS — Z7982 Long term (current) use of aspirin: Secondary | ICD-10-CM | POA: Diagnosis not present

## 2020-11-08 DIAGNOSIS — M25462 Effusion, left knee: Secondary | ICD-10-CM | POA: Diagnosis not present

## 2020-11-08 DIAGNOSIS — Z7901 Long term (current) use of anticoagulants: Secondary | ICD-10-CM | POA: Diagnosis not present

## 2020-11-08 LAB — CMP (CANCER CENTER ONLY)
ALT: 21 U/L (ref 0–44)
AST: 23 U/L (ref 15–41)
Albumin: 3.7 g/dL (ref 3.5–5.0)
Alkaline Phosphatase: 71 U/L (ref 38–126)
Anion gap: 8 (ref 5–15)
BUN: 20 mg/dL (ref 8–23)
CO2: 24 mmol/L (ref 22–32)
Calcium: 9.4 mg/dL (ref 8.9–10.3)
Chloride: 104 mmol/L (ref 98–111)
Creatinine: 1.04 mg/dL (ref 0.61–1.24)
GFR, Estimated: >60 mL/min (ref >60)
Glucose, Bld: 102 mg/dL — ABNORMAL HIGH (ref 70–99)
Potassium: 4.8 mmol/L (ref 3.5–5.1)
Sodium: 136 mmol/L (ref 135–145)
Total Bilirubin: 0.9 mg/dL (ref 0.3–1.2)
Total Protein: 7.5 g/dL (ref 6.5–8.1)

## 2020-11-08 LAB — CBC WITH DIFFERENTIAL/PLATELET
Abs Immature Granulocytes: 0.02 10*3/uL (ref 0.00–0.07)
Basophils Absolute: 0.1 10*3/uL (ref 0.0–0.1)
Basophils Relative: 1 %
Eosinophils Absolute: 0.2 10*3/uL (ref 0.0–0.5)
Eosinophils Relative: 2 %
HCT: 36.1 % — ABNORMAL LOW (ref 39.0–52.0)
Hemoglobin: 11.8 g/dL — ABNORMAL LOW (ref 13.0–17.0)
Immature Granulocytes: 0 %
Lymphocytes Relative: 20 %
Lymphs Abs: 1.6 10*3/uL (ref 0.7–4.0)
MCH: 30 pg (ref 26.0–34.0)
MCHC: 32.7 g/dL (ref 30.0–36.0)
MCV: 91.9 fL (ref 80.0–100.0)
Monocytes Absolute: 0.8 10*3/uL (ref 0.1–1.0)
Monocytes Relative: 10 %
Neutro Abs: 5.4 10*3/uL (ref 1.7–7.7)
Neutrophils Relative %: 67 %
Platelets: 267 10*3/uL (ref 150–400)
RBC: 3.93 MIL/uL — ABNORMAL LOW (ref 4.22–5.81)
RDW: 13.6 % (ref 11.5–15.5)
WBC: 8.2 10*3/uL (ref 4.0–10.5)
nRBC: 0 % (ref 0.0–0.2)

## 2020-11-08 LAB — URIC ACID: Uric Acid, Serum: 7.1 mg/dL (ref 3.7–8.6)

## 2020-11-08 LAB — SEDIMENTATION RATE: Sed Rate: 57 mm/hr — ABNORMAL HIGH (ref 0–16)

## 2020-11-08 LAB — D-DIMER, QUANTITATIVE: D-Dimer, Quant: 1.3 ug/mL-FEU — ABNORMAL HIGH (ref 0.00–0.50)

## 2020-11-08 NOTE — Patient Instructions (Signed)
Thank you for choosing [facility] to provide your oncology and hematology care.   Should you have questions after your visit to the [facility] ([facility]), please contact this office at [phone] between 8:30 AM and 4:30 PM.  Voice mails left after 4:00 PM may not be returned until the following business day.  Calls received after 4:30 PM will be answered by an off-site Nurse Triage Line.    Prescription Refills:  Please have your pharmacy contact us directly for most prescription requests.  Contact the office directly for refills of narcotics (pain medications). Allow 48-72 hours for refills.  Appointments: Please contact the [facility] scheduling department [phone] for questions regarding [facility] appointment scheduling.  Contact the schedulers with any scheduling changes so that your appointment can be rescheduled in a timely manner.   Central Scheduling for [place] [phone] - Call to schedule procedures such as PET scans, CT scans, MRI, Ultrasound, etc.  To afford each patient quality time with our providers, please arrive 30 minutes before your scheduled appointment time.  If you arrive late for your appointment, you may be asked to reschedule.  We strive to give you quality time with our providers, and arriving late affects you and other patients whose appointments are after yours. If you are a no show for multiple scheduled visits, you may be dismissed from the clinic at the providers discretion.     Resources: [facility] Social Workers [phone] for additional information on assistance programs or assistance connecting with community support programs   [place] DSS  [phone]: Information regarding food stamps, Medicaid, and utility assistance [place] Access [place] [phone]   [place] [employer]'s shared-ride transportation service for eligible riders who have a disability that prevents them from riding the fixed route bus.   Medicare  Rights Center 800-333-4114 Helps people with Medicare understand their rights and benefits, navigate the Medicare system, and secure the quality healthcare they deserve [employer] 800-227-2345 Assists patients locate various types of support and financial assistance Cancer Care: 1-800-813-HOPE (4673) Provides financial assistance, online support groups, medication/co-pay assistance.   Transportation Assistance for appointments at [facility]: Transportation Coordinator [phone]  Again, thank you for choosing  Cancer Center for your care.       

## 2020-11-08 NOTE — Telephone Encounter (Signed)
Scheduled appointments per 2/22 los. Spoke to patient who is aware of appointments dates and times. Gave patient calendar print out.  

## 2020-11-09 LAB — BETA-2-GLYCOPROTEIN I ABS, IGG/M/A
Beta-2 Glyco I IgG: <9 GPI IgG units (ref 0–20)
Beta-2-Glycoprotein I IgA: 15 GPI IgA units (ref 0–25)
Beta-2-Glycoprotein I IgM: <9 GPI IgM units (ref 0–32)

## 2020-11-10 LAB — MULTIPLE MYELOMA PANEL, SERUM
Albumin SerPl Elph-Mcnc: 3.8 g/dL (ref 2.9–4.4)
Albumin/Glob SerPl: 1.3 (ref 0.7–1.7)
Alpha 1: 0.3 g/dL (ref 0.0–0.4)
Alpha2 Glob SerPl Elph-Mcnc: 0.7 g/dL (ref 0.4–1.0)
B-Globulin SerPl Elph-Mcnc: 1 g/dL (ref 0.7–1.3)
Gamma Glob SerPl Elph-Mcnc: 1.1 g/dL (ref 0.4–1.8)
Globulin, Total: 3.1 g/dL (ref 2.2–3.9)
IgA: 169 mg/dL (ref 61–437)
IgG (Immunoglobin G), Serum: 1068 mg/dL (ref 603–1613)
IgM (Immunoglobulin M), Srm: 197 mg/dL — ABNORMAL HIGH (ref 15–143)
M Protein SerPl Elph-Mcnc: 0.3 g/dL — ABNORMAL HIGH
Total Protein ELP: 6.9 g/dL (ref 6.0–8.5)

## 2020-11-10 LAB — LUPUS ANTICOAGULANT PANEL
DRVVT: 50.7 s — ABNORMAL HIGH (ref 0.0–47.0)
PTT Lupus Anticoagulant: 33.6 s (ref 0.0–51.9)

## 2020-11-10 LAB — CARDIOLIPIN ANTIBODIES, IGG, IGM, IGA
Anticardiolipin IgA: <9 APL U/mL (ref 0–11)
Anticardiolipin IgG: <9 GPL U/mL (ref 0–14)
Anticardiolipin IgM: <9 MPL U/mL (ref 0–12)

## 2020-11-10 LAB — DRVVT CONFIRM: dRVVT Confirm: 1 ratio (ref 0.8–1.2)

## 2020-11-10 LAB — DRVVT MIX: dRVVT Mix: 44.5 s — ABNORMAL HIGH (ref 0.0–40.4)

## 2020-11-11 ENCOUNTER — Ambulatory Visit
Admission: RE | Admit: 2020-11-11 | Discharge: 2020-11-11 | Disposition: A | Discharge status: Home / Self Care | Payer: Medicare Other | Source: Ambulatory Visit | Attending: Student | Admitting: Student

## 2020-11-11 ENCOUNTER — Telehealth: Payer: Self-pay

## 2020-11-11 ENCOUNTER — Other Ambulatory Visit: Payer: Self-pay

## 2020-11-11 DIAGNOSIS — I82402 Acute embolism and thrombosis of unspecified deep veins of left lower extremity: Secondary | ICD-10-CM

## 2020-11-11 DIAGNOSIS — R0602 Shortness of breath: Secondary | ICD-10-CM

## 2020-11-11 DIAGNOSIS — M47814 Spondylosis without myelopathy or radiculopathy, thoracic region: Secondary | ICD-10-CM | POA: Diagnosis not present

## 2020-11-11 DIAGNOSIS — Z86718 Personal history of other venous thrombosis and embolism: Secondary | ICD-10-CM | POA: Diagnosis not present

## 2020-11-11 DIAGNOSIS — I2510 Atherosclerotic heart disease of native coronary artery without angina pectoris: Secondary | ICD-10-CM | POA: Diagnosis not present

## 2020-11-11 DIAGNOSIS — J9811 Atelectasis: Secondary | ICD-10-CM | POA: Diagnosis not present

## 2020-11-11 MED ORDER — IOPAMIDOL (ISOVUE-370) INJECTION 76%
75.0000 mL | Freq: Once | INTRAVENOUS | Status: AC | PRN
  Administered 2020-11-11: 75 mL via INTRAVENOUS

## 2020-11-11 NOTE — Progress Notes (Signed)
Please inform patient no evidence of pulmonary embolism on CT of the chest.

## 2020-11-11 NOTE — Telephone Encounter (Signed)
-----   Message from Alethia Berthold, [place] sent at 11/11/2020  4:30 PM EST ----- Please inform patient no evidence of pulmonary embolism on CT of the chest.

## 2020-11-12 ENCOUNTER — Other Ambulatory Visit: Payer: Self-pay | Admitting: Cardiology

## 2020-11-12 DIAGNOSIS — Z8679 Personal history of other diseases of the circulatory system: Secondary | ICD-10-CM

## 2020-11-12 DIAGNOSIS — I10 Essential (primary) hypertension: Secondary | ICD-10-CM

## 2020-11-12 MED ORDER — ACEBUTOLOL HCL 200 MG PO CAPS: 200.0000 mg | ORAL_CAPSULE | Freq: Two times a day (BID) | ORAL | 3 refills | Status: DC

## 2020-11-15 DIAGNOSIS — M25562 Pain in left knee: Secondary | ICD-10-CM | POA: Diagnosis not present

## 2020-11-16 DIAGNOSIS — K219 Gastro-esophageal reflux disease without esophagitis: Secondary | ICD-10-CM | POA: Diagnosis not present

## 2020-11-16 DIAGNOSIS — E7800 Pure hypercholesterolemia, unspecified: Secondary | ICD-10-CM | POA: Diagnosis not present

## 2020-11-16 DIAGNOSIS — I10 Essential (primary) hypertension: Secondary | ICD-10-CM | POA: Diagnosis not present

## 2020-11-17 ENCOUNTER — Telehealth: Payer: Self-pay | Admitting: *Deleted

## 2020-11-17 DIAGNOSIS — I10 Essential (primary) hypertension: Secondary | ICD-10-CM | POA: Diagnosis not present

## 2020-11-17 NOTE — Telephone Encounter (Signed)
Contacted patient regarding test results per Dr. Grier Mitts message below. Patient verbalized understanding of all information. Encouraged patient to contact office if he has not received call from [facility] by 3/8 to scheduled ordered studies.

## 2020-11-17 NOTE — Telephone Encounter (Signed)
-----   Message from Brunetta Genera, MD sent at 11/16/2020 11:19 PM EST ----- Please let patient know his lab work-up did not show any obvious hypercoagulable state.  Have ordered ultrasound IVC and iliacs and ultrasound for venous reflux studies hopefully to be done in the next 2 weeks prior to his follow-up with [place] to rule out venous reflux and may Thurner syndrome as local vascular risk factors for his femoral vein DVT despite being on Eliquis.

## 2020-11-21 DIAGNOSIS — M25562 Pain in left knee: Secondary | ICD-10-CM | POA: Diagnosis not present

## 2020-11-21 DIAGNOSIS — M25462 Effusion, left knee: Secondary | ICD-10-CM | POA: Diagnosis not present

## 2020-11-23 DIAGNOSIS — M25462 Effusion, left knee: Secondary | ICD-10-CM | POA: Diagnosis not present

## 2020-11-23 DIAGNOSIS — M25562 Pain in left knee: Secondary | ICD-10-CM | POA: Diagnosis not present

## 2020-11-24 ENCOUNTER — Encounter: Payer: Self-pay | Admitting: Pulmonary Disease

## 2020-11-24 ENCOUNTER — Other Ambulatory Visit: Payer: Self-pay

## 2020-11-24 ENCOUNTER — Ambulatory Visit: Payer: Medicare Other | Admitting: Pulmonary Disease

## 2020-11-24 DIAGNOSIS — G4733 Obstructive sleep apnea (adult) (pediatric): Secondary | ICD-10-CM

## 2020-11-24 DIAGNOSIS — I82412 Acute embolism and thrombosis of left femoral vein: Secondary | ICD-10-CM | POA: Diagnosis not present

## 2020-11-24 DIAGNOSIS — I82409 Acute embolism and thrombosis of unspecified deep veins of unspecified lower extremity: Secondary | ICD-10-CM | POA: Insufficient documentation

## 2020-11-24 NOTE — Patient Instructions (Signed)
CLot has improved -  Reassuring CPAP is working well

## 2020-11-24 NOTE — Progress Notes (Signed)
   Subjective:    Patient ID: MARIE CHOW, male    DOB: March 30, 1934, 87 y.o.   MRN: [id number]  HPI  87 yo never smoker follow-up of moderate OSA.  PMH-atrial fibrillation on Eliquis status post ablation  GERD Left carotid stent, subclavian stenosis 06/2020 Hyponatremia, AKI - related to diuretic and contrast  11/2019 episode of hemoptysis  10/26/20 Developed LT knee swelling Ortho - unable to tap initially, removed 10 cc on subsequent visit, not sent for testing 2/7 Duplex showed age indeterminate DVT LT femoral vein & LT small saphenous  Already on eliquis 5 bid D/w cards Dr Einar Gip ,doubt that this represents eliquis failure Advised  to increase eliquis 10 mg bid x 1 week then back down to 5mg  bid we obtained hematology consult, reviewed & repeat duplex LLE 2/17 chronic superficial clot -small saphenous, DVT resolved SPEP showed mild M protein spike  He is compliant with apixaban.  Wearing his compression stockings.  Still complains of occasional swelling and pain in his left knee, uses a cane to walk.  CT angiogram chest was obtained, reviewed did not show any evidence of pulmonary emboli, 1 to 2 mm nodules likely of no significance   Significant tests/ events reviewed  PSG 10/2008 >> AHI 16, predom supine corrected by CPAP 10 cm Later on, adjusted by autoCPAP to 12 cm  Review of Systems neg for any significant sore throat, dysphagia, itching, sneezing, nasal congestion or excess/ purulent secretions, fever, chills, sweats, unintended wt loss, pleuritic or exertional cp, hempoptysis, orthopnea pnd or change in chronic leg swelling. Also denies presyncope, palpitations, heartburn, abdominal pain, nausea, vomiting, diarrhea or change in bowel or urinary habits, dysuria,hematuria, rash, arthralgias, visual complaints, headache, numbness weakness or ataxia.     Objective:   Physical Exam  Gen. Pleasant, well-nourished, in no distress ENT - no thrush, no pallor/icterus,no post  nasal drip Neck: No JVD, no thyromegaly, no carotid bruits Lungs: no use of accessory muscles, no dullness to percussion, clear without rales or rhonchi  Cardiovascular: Rhythm regular, heart sounds  normal, no murmurs or gallops, no peripheral edema Musculoskeletal: No deformities, no cyanosis or clubbing , mild knee swelling, no fluctuance, nontender       Assessment & Plan:

## 2020-11-24 NOTE — Assessment & Plan Note (Signed)
CPAP download was reviewed which shows excellent compliance and good control of events on auto settings with average pressure of 11 cm. He is very compliant and CPAP is certainly helped improve his daytime somnolence and fatigue

## 2020-11-24 NOTE — Assessment & Plan Note (Signed)
Follow-up Doppler shows left DVT is resolved.  Superficial saphenous vein thrombosis persists but is age-indeterminate.  Not worried about this, he will continue on apixaban which he needs anyways for his atrial fibrillation. Hematology consult reviewed and plan is to check ultrasound of the iliacs to see if there is higher obstruction or may Thurner .  Based on this we may follow-up in 3 to 6 months to see if left superficial thrombus is resolved. I have asked him to continue compression stockings for another 4 to 6 weeks and then use it on a as needed basis

## 2020-11-28 ENCOUNTER — Encounter: Payer: Self-pay | Admitting: Student

## 2020-11-28 ENCOUNTER — Ambulatory Visit: Payer: Medicare Other | Admitting: Student

## 2020-11-28 ENCOUNTER — Other Ambulatory Visit: Payer: Self-pay

## 2020-11-28 VITALS — BP 182/81 | HR 59 | Temp 97.8°F | Ht 67.0 in | Wt 161.0 lb

## 2020-11-28 DIAGNOSIS — M25462 Effusion, left knee: Secondary | ICD-10-CM | POA: Diagnosis not present

## 2020-11-28 DIAGNOSIS — R0602 Shortness of breath: Secondary | ICD-10-CM

## 2020-11-28 DIAGNOSIS — I10 Essential (primary) hypertension: Secondary | ICD-10-CM | POA: Diagnosis not present

## 2020-11-28 DIAGNOSIS — M25562 Pain in left knee: Secondary | ICD-10-CM | POA: Diagnosis not present

## 2020-11-28 NOTE — Progress Notes (Signed)
Primary Physician/Referring:  Aretta Nip, MD  Patient ID: Dalton Cardenas, male    DOB: Aug 12, 1934, 87 y.o.   MRN: [id number]  Chief Complaint  Patient presents with  . Hypertension  . Follow-up  . Shortness of Breath   HPI:    Dalton Cardenas  is a 87 y.o. 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 Cristopher Peru and recommended either ablation or continued medical therapy.  Patient with recurrence of atypical atrial flutter 06/30/2020. Patient underwent left transcarotid revascularization (TCAR), stent left internal carotid artery by Dr. Ruta Hinds 08/01/2020.   Patient presents for 4-week follow-up of shortness of breath.  He has had no recurrence of shortness of breath.  He does continue to have left knee swelling, has undergone MRI which revealed arthritis.  He is presently undergoing physical therapy for arthritis.  He is tolerating physical therapy well without issue.  Denies chest pain, palpitations, syncope, near syncope.  Patient continues to take hydralazine 50 mg 2-3 times per day, despite recommended increased dose to 4 times daily at last visit.  He monitors his blood pressure regularly, and is enrolled in remote patient monitoring.  Average blood pressure is 149/71 mmHg.  Past Medical History:  Diagnosis Date  . Coronary artery disease    60-70% circumflex (cath in [place] 08/2010).  Marland Kitchen Dysrhythmia    A history of A flutter and tachycardia (SVT)  . GERD (gastroesophageal reflux disease)   . Hypercholesterolemia   . Hypertension   . Prostate cancer [facility])    status post prostatectomy  . Sleep apnea   . 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 SVT (supraventricular tachycardia) ([place])    a. Holter monitoring 2011 - AV node reentry, multifocal atrial tachycardia, and resting  bradycardia. b. previously tx with [facility] in [place].   Past Surgical History:  Procedure Laterality Date  . ABLATION OF DYSRHYTHMIC FOCUS  10/13/2014   SVT         by Dr Lovena Le  . ANAL FISSURE REPAIR    . CARDIOVERSION N/A 07/01/2020   Procedure: CARDIOVERSION;  Surgeon: Adrian Prows, MD;  Location: [facility];  Service: Cardiovascular;  Laterality: N/A;  . ELECTROPHYSIOLOGY STUDY N/A 10/13/2014   Procedure: ELECTROPHYSIOLOGY STUDY;  Surgeon: Evans Lance, MD;  Location: [facility] CATH LAB;  Service: Cardiovascular;  Laterality: N/A;  . EYE SURGERY Bilateral 2011   cataract   . PROSTATECTOMY    . SUPRAVENTRICULAR TACHYCARDIA ABLATION N/A 10/13/2014   Procedure: SUPRAVENTRICULAR TACHYCARDIA ABLATION;  Surgeon: Evans Lance, MD;  Location[facility] C[facility] LAB;  Service: Cardiovascular;  Laterality: N/A;  . TRANSCAROTID ARTERY REVASCULARIZATION (TCAR) Left 08/01/2020  . TRANSCAROTID ARTERY REVASCULARIZATION Left 08/01/2020   Procedure: LEFT TRANSCAROTID ARTERY REVASCULARIZATION;  Surgeon: Elam Dutch, MD;  Location: [place] [facility];  Service: Vascular;  Laterality: Left;  . ULTRASOUND GUIDANCE FOR VASCULAR ACCESS Right 08/01/2020   Procedure: ULTRASOUND GUIDANCE FOR VASCULAR ACCESS;  Surgeon: Elam Dutch, MD;  Location[facility];  Service: Vascular;  Laterality: Right;   Social History   Tobacco Use  . Smoking status: Never Smoker  . Smokeless tobacco: Never Used  Substance Use Topics  . Alcohol use: No   ROS  Review of Systems  Constitutional: Negative for malaise/fatigue and weight gain.  Cardiovascular: Negative for chest pain, claudication, leg  swelling, near-syncope, orthopnea, palpitations, paroxysmal nocturnal dyspnea and syncope.  Respiratory: Negative for shortness of breath.   Hematologic/Lymphatic: Does not bruise/bleed easily.  Gastrointestinal: Negative for melena.  Neurological: Negative for dizziness and weakness.   Objective  Blood pressure (!) 182/81, pulse (!) 59, temperature  97.8 F (36.6 C), height 5\' 7"  (1.702 m), weight 161 lb (73 kg), SpO2 96 %.  Vitals with BMI 11/28/2020 11/28/2020 11/24/2020  Height - 5\' 7"  5\' 7"   Weight - 161 lbs 161 lbs  BMI - 21.30 86.57  Systolic 846 962 952  Diastolic 81 74 72  Pulse 59 60 56     Physical Exam Vitals reviewed.  Constitutional:      Appearance: Normal appearance.  Neck:     Thyroid: No thyromegaly.  Cardiovascular:     Rate and Rhythm: Normal rate and regular rhythm.     Pulses: Intact distal pulses.     Heart sounds: Normal heart sounds. No murmur heard. No gallop.      Comments: No JVD. Left carotid endarterectomy.  1+ pitting edema left leg. Trace edema right leg.  Pulmonary:     Effort: Pulmonary effort is normal.     Breath sounds: Normal breath sounds. No wheezing, rhonchi or rales.  Musculoskeletal:     Cervical back: Neck supple.     Right lower leg: Edema (trace) present.     Left lower leg: Edema (1+ pitting ) present.  Skin:    General: Skin is warm and dry.  Neurological:     Mental Status: He is alert.    Laboratory examination:   Recent Labs    06/10/20 1637 06/30/20 0532 07/26/20 1513 08/01/20 1419 08/02/20 0500 11/08/20 1417  NA 133*   < > 135  --  132* 136  K 5.1   < > 4.4  --  4.3 4.8  CL 99   < > 103  --  104 104  CO2 23   < > 23  --  22 24  GLUCOSE 87   < > 103*  --  98 102*  BUN 18   < > 16  --  13 20  CREATININE 1.07   < > 1.00 0.96 1.02 1.04  CALCIUM 9.5   < > 9.6  --  7.7* 9.4  GFRNONAA 63   < > >60 >60 >60 >60  GFRAA 73  --   --   --   --   --    < > = values in this interval not displayed.   estimated creatinine clearance is 49.4 mL/min (by C-G formula based on SCr of 1.04 mg/dL).  CMP Latest Ref Rng & Units 11/08/2020 08/02/2020 08/01/2020  Glucose 70 - 99 mg/dL 102(H) 98 -  BUN 8 - 23 mg/dL 20 13 -  Creatinine 0.61 - 1.24 mg/dL 1.04 1.02 0.96  Sodium 135 - 145 mmol/L 136 132(L) -  Potassium 3.5 - 5.1 mmol/L 4.8 4.3 -  Chloride 98 - 111 mmol/L 104 104 -   CO2 22 - 32 mmol/L 24 22 -  Calcium 8.9 - 10.3 mg/dL 9.4 7.7(L) -  Total Protein 6.5 - 8.1 g/dL 7.5 - -  Total Bilirubin 0.3 - 1.2 mg/dL 0.9 - -  Alkaline Phos 38 - 126 U/L 71 - -  AST 15 - 41 U/L 23 - -  ALT 0 - 44 U/L 21 - -   CBC Latest Ref Rng & Units 11/08/2020 08/02/2020 08/01/2020  WBC 4.0 - 10.5 K/uL  8.2 9.0 11.5(H)  Hemoglobin 13.0 - 17.0 g/dL 11.8(L) 10.2(L) 13.2  Hematocrit 39.0 - 52.0 % 36.1(L) 30.1(L) 39.4  Platelets 150 - 400 K/uL 267 220 255   Lipid Panel  No results found for: CHOL, TRIG, HDL, CHOLHDL, VLDL, LDLCALC, LDLDIRECT HEMOGLOBIN A1C No results found for: HGBA1C, MPG TSH Recent Labs    06/30/20 1406  TSH 1.774    External labs 07/06/2019:   Cholesterol, total 189.000 01/25/2020 HDL 46.000 01/25/2020 LDL-C 123.000 01/25/2020 Triglycerides 108.000 01/25/2020  A1C 6.300 01/25/2020 TSH 4.640 01/25/2020   Hemoglobin 12.800 01/25/2020  Creatinine, Serum 1.150 01/25/2020 Potassium 4.100 01/25/2020 Magnesium N/D ALT (SGPT) 20.000 01/25/2020   Cholesterol, total 281.000 07/06/2019 HDL 42.000 07/06/2019 LDL 206.000 07/06/2019 Triglycerides 168.000 07/06/2019 A1C 5.800 07/06/2019 Hemoglobin 12.800 07/06/2019 Creatinine, Serum 1.140 07/06/2019 Potassium 4.200 07/06/2019 Magnesium N/D ALT (SGPT) 12.000 07/06/2019  Medications and allergies   Allergies  Allergen Reactions  . Amoxicillin Diarrhea    GI upset: Bleeding diarrhea  Other reaction(s): GI Bleed  . Ibuprofen Diarrhea    Bloody diarrhea   . Penicillins Diarrhea    Bloody diarrhea   . Ace Inhibitors Swelling    Face swelling   . Amlodipine Swelling  . Spironolactone     Hyponatremia   . Atorvastatin     Weakness   . Azithromycin     Stomach ache   . Diltiazem Nausea Only    Upset stomach per patient  . Hydrochlorothiazide Other (See Comments)    Photosensitivity    Current Outpatient Medications on File Prior to Visit  Medication Sig Dispense Refill  . acebutolol (SECTRAL)  200 MG capsule Take 1 capsule (200 mg total) by mouth 2 (two) times daily. 180 capsule 3  . ALPRAZolam (XANAX) 0.25 MG tablet Take 1 tablet (0.25 mg total) by mouth at bedtime as needed for sleep. 30 tablet 0  . apixaban (ELIQUIS) 5 MG TABS tablet Take 1 tablet (5 mg total) by mouth 2 (two) times daily. 180 tablet 2  . aspirin EC 81 MG tablet Take 1 tablet (81 mg total) by mouth daily. 70 tablet 1  . b complex vitamins tablet Take 1 tablet by mouth daily.     . Cholecalciferol (VITAMIN D) 125 MCG (5000 UT) CAPS Take 5,000 Units by mouth daily.     . Coenzyme Q10 (CO Q-10) 200 MG CAPS Take 200 mg by mouth daily.     . Cyanocobalamin (B-12) 5000 MCG CAPS Take 5,000 mcg by mouth daily.    . Ferrous Sulfate 27 MG TABS Take 27 mg by mouth daily.    . folic acid (FOLVITE) 619 MCG tablet Take 800 mcg by mouth daily.    . hydrALAZINE (APRESOLINE) 50 MG tablet Take 1 tablet (50 mg total) by mouth in the morning, at noon, in the evening, and at bedtime. Take extra one if BP >140/70 270 tablet 3  . L-Arginine 1000 MG TABS Take 1,000 mg by mouth daily.     . LUTEIN PO Take 1 tablet by mouth daily.     . Magnesium 250 MG TABS Take 500 mg by mouth daily.     . melatonin 5 MG TABS Take 1 tablet by mouth daily as needed.    Marland Kitchen omega-3 acid ethyl esters (LOVAZA) 1 g capsule Take 1 g by mouth daily.    Marland Kitchen PAPAYA ENZYME PO Take 1 tablet by mouth daily.    . Probiotic Product (ALIGN PO) Take 1 capsule by mouth daily.     Marland Kitchen  PSYLLIUM PO Take 1 tablet by mouth daily.     . rosuvastatin (CRESTOR) 10 MG tablet Take 10 mg by mouth daily.      . valsartan (DIOVAN) 160 MG tablet Take 1 tablet (160 mg total) by mouth daily. 90 tablet 3  . Vitamin A 2400 MCG (8000 UT) CAPS Take 8,000 Units by mouth daily.    . vitamin C (ASCORBIC ACID) 500 MG tablet Take 500 mg by mouth daily.    Marland Kitchen zinc gluconate 50 MG tablet Take 50 mg by mouth daily.     No current facility-administered medications on file prior to visit.    Radiology:   No results found.   Chest x-ray 06/30/2020: Lungs are clear.  No pleural effusion or pneumothorax. The heart is normal in size.  Thoracic aortic atherosclerosis. Degenerative changes of the visualized thoracolumbar spine.  Cardiac Studies:  Coronary Angiogram  [2011]: Performed in [place], 70% stenosis in the circumflex coronary artery. Was performed as a part of workup for EP study for the SVT.  Nuclear stress test  [04/26/2010]: Myocardial perfusion scan 04/26/2010: Normal perfusion, EF 77%.  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  Echocardiogram 06/03/2020: Left ventricle cavity is normal in size. Moderate asymmetric hypertrophy of the left ventricle. Basal septum measures at 2.0 cm. Mild LVOT peak gradient 5 mmHg without SAM or LVOT obstruction. Normal wall motion. LVEF 55-60%. Indeterminate diastolic dysfunction. Left atrial cavity is mildly dilated. Trileaflet aortic valve. Trace aortic stenosis. Moderate (Grade II) aortic regurgitation. Mild calcification, No significant stenosis. Moderate (Grade II) mitral regurgitation. Mild tricuspid regurgitation. Estimated pulmonary artery systolic pressure 30 mmHg.  No significant change compared to previous study in 2018.  Carotid artery duplex  06/03/2020: Stenosis in the right internal carotid artery (16-49%). Stenosis in the left internal carotid artery (>=70%). The left PSV internal/common carotid artery ratio is consistent with a stenosis of >70%. The duplex suggest near subtotal occlusion with soft plaque at the level of the proximal left ICA.  Recommend different modality imaging to confirm severity of the disease.  Follow up study in six months is appropriate if clinically indicated.  CT angio neck 06/28/2020: 1.  Noncalcified atherosclerotic disease in the bifurcation of the right ICA <50%  stenosis. 2. 80% stenosis of the proximal left internal carotid artery secondary to noncalcified plaque. 3. 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 4. Aortic Atherosclerosis (ICD10-I70.0).  CT angio head W or WO Contrast 07/12/2020:  No acute intracranial abnormality. Mild chronic microvascular ischemic changes. Chronic right caudate infarct. Short segment marked stenosis of the distal intracranial right vertebral artery.  Transcarotid artery revascularization 08/01/2020: Left transcarotid revascularization (TCAR), stent left internal carotid artery  Carotid Duplex 08/25/2020:  Right Carotid: Velocities in the right ICA are consistent with a 1-39% stenosis. Left Carotid: The ECA appears >50% stenosed. Patent stent with no stenosis noted. Vertebrals: Bilateral vertebral arteries demonstrate antegrade flow. Subclavians: Normal flow hemodynamics were seen in bilateral subclavian arteries.  Lower Venous DVT Study 10/24/2020:  RIGHT:  - No evidence of common femoral vein obstruction.  LEFT:  - Findings consistent with age indeterminate deep vein thrombosis involving the left femoral vein.  - Findings consistent with age indeterminate superficial vein thrombosis involving the left small sahenous vein.  - No cystic structure found  in the popliteal fossa.  EKG:    EKG 10/31/20: Sinus bradycardia a 53 bpm with first-degree AV block, left atrial enlargement.  Left axis deviation, left anterior fascicular block.  Poor R wave progression, cannot exclude anteroseptal infarct old. LVH with early repolarization.   EKG 07/11/2020: Sinus rhythm at a rate of 70 bpm with first-degree AV block, left atrial enlargement.  Left axis deviation, left anterior fascicular block.  Poor R wave progression, cannot exclude anteroseptal infarct old.  Compared to EKG/16/2021, no  significant change.  Assessment     ICD-10-CM   1. Essential hypertension, benign  I10   2. Shortness of breath  R06.02     CHA2DS2-VASc Score is 4.  Yearly risk of stroke: 4.8% (A, HTN, Vasc Dz).  Score of 1=0.6; 2=2.2; 3=3.2; 4=4.8; 5=7.2; 6=9.8; 7=>9.8) -(CHF; HTN; vasc disease DM,  Male = 1; Age <65 =0; 65-74 = 1,  >75 =2; stroke/embolism= 2).    No orders of the defined types were placed in this encounter.  Medications Discontinued During This Encounter  Medication Reason  . hydrALAZINE (APRESOLINE) 50 MG tablet Duplicate  . hydrALAZINE (APRESOLINE) 25 MG tablet Change in therapy    Recommendations:   JAKHI DISHMAN  is a 87 y.o. 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 Cristopher Peru and recommended either ablation or continued medical therapy.  Patient with recurrence of atypical atrial flutter 06/30/2020. Patient underwent left transcarotid revascularization (TCAR), stent left internal carotid artery by Dr. Ruta Hinds 08/01/2020.   Patient presents for 4-week follow-up.  He has had no recurrence of shortness of breath and is overall doing well.  Although his blood pressure remains elevated above goal.  Advised patient to increase hydralazine to 50 mg 4 times daily, as discussed at previous visit.  Discussed with patient regarding option of adding isosorbide dinitrate, however he is resistant to try new medications and would rather first increase hydralazine as he has been tolerating it well.  Again provided reassurance to patient from a cardiovascular standpoint.  Patient does have some episodes of low blood pressure on review of mobile monitoring, however he remains asymptomatic.  Advised patient to take at least 3 doses of hydralazine daily, with the fourth dose for blood pressure >130/90 mmHg.,  Verbalized understanding agreement.  Patient has a  history of hyponatremia with higher doses of ACE/ARB, therefore will not increase Will start at this time.  Patient prefers to keep follow-up appointment in 3 months with Dr. Einar Gip for further management of hypertension, hyperlipidemia, and CAD.   Alethia Berthold, PA-C 11/28/2020, 12:48 PM Office: [phone]

## 2020-12-01 DIAGNOSIS — M25562 Pain in left knee: Secondary | ICD-10-CM | POA: Diagnosis not present

## 2020-12-01 DIAGNOSIS — M25462 Effusion, left knee: Secondary | ICD-10-CM | POA: Diagnosis not present

## 2020-12-03 DIAGNOSIS — G4733 Obstructive sleep apnea (adult) (pediatric): Secondary | ICD-10-CM | POA: Diagnosis not present

## 2020-12-05 DIAGNOSIS — M25562 Pain in left knee: Secondary | ICD-10-CM | POA: Diagnosis not present

## 2020-12-05 DIAGNOSIS — M25462 Effusion, left knee: Secondary | ICD-10-CM | POA: Diagnosis not present

## 2020-12-08 DIAGNOSIS — M25562 Pain in left knee: Secondary | ICD-10-CM | POA: Diagnosis not present

## 2020-12-08 DIAGNOSIS — M25462 Effusion, left knee: Secondary | ICD-10-CM | POA: Diagnosis not present

## 2020-12-14 DIAGNOSIS — M25562 Pain in left knee: Secondary | ICD-10-CM | POA: Diagnosis not present

## 2020-12-14 DIAGNOSIS — M25462 Effusion, left knee: Secondary | ICD-10-CM | POA: Diagnosis not present

## 2020-12-17 DIAGNOSIS — I10 Essential (primary) hypertension: Secondary | ICD-10-CM | POA: Diagnosis not present

## 2020-12-19 DIAGNOSIS — M25562 Pain in left knee: Secondary | ICD-10-CM | POA: Diagnosis not present

## 2020-12-19 DIAGNOSIS — M25462 Effusion, left knee: Secondary | ICD-10-CM | POA: Diagnosis not present

## 2020-12-19 NOTE — Progress Notes (Signed)
HEMATOLOGY/ONCOLOGY CONSULTATION NOTE  Date of Service: 12/19/2020  Patient Care Team: Rankins, Bill Salinas, MD as PCP - General (Family Medicine)  CHIEF COMPLAINTS/PURPOSE OF CONSULTATION:  DVT  HISTORY OF PRESENTING ILLNESS:   Dalton Cardenas is a wonderful 87 y.o. male who has been referred to [place] by Dr. Elsworth Soho for evaluation and management of DVT occurring while on ELiquis.   Patient has a history of hypertension, dyslipidemia, coronary artery disease status post cath and 2011, remote history of prostate cancer status post prostatectomy, sleep apnea on CPAP, atrial flutter on chronic Eliquis, previous history of SVT/multifocal atrial tachycardia.  Patient was noted to have greater than 80% left internal carotid artery stenosis after follow-up for an asymptomatic carotid bruit.  No previous history of TIA or strokes.  He underwent a left transcarotid artery revascularization on 08/01/2020 by Dr. Oneida Alar with stenting and was on aspirin plus Plavix plus Eliquis postprocedure.  The Plavix was discontinued 2 months after the procedure.  Patient presented on 10/24/2020 with left lower extremity swelling and had a venous ultrasound which showed an age-indeterminate DVT involving the left femoral vein and age indeterminate superficial venous thrombosis involving the left small saphenous vein.  His Eliquis was increased to 10 mg p.o. twice daily for a week.  He had a repeat venous ultrasound on 11/03/2020 which showed chronic superficial venous thrombosis involving the left small saphenous vein and no evidence of DVT in his left lower extremity.  Mid femoral vein thrombus was noted to have resolved.  Patient notes that a few days prior to the leg swelling he also had a minor fall and did bump his left knee which has been swollen and that he has follow-up with orthopedics to evaluate this further.  Patient reports no previous history of venous thromboembolism or unusual arterial thrombosis.  No  family history of VTE.  On review of systems, pt reports some shortness of breath and did see his cardiologist on 10/31/2020 and this was thought to be related to elevated blood pressures hypertensives were adjusted.  There was also an thought that there was a significant element of anxiety involved .with cardiology for further evaluation management of shortness of breath . Patient was seen by Dr. Elsworth Soho from pulmonary and no concern at that time was noted for PE. Patient did subsequently have a CTA of the chest after our visit ordered by cardiology on 2/25 which showed no evidence of PE.  Patient denies any weight loss or other new focal symptoms suggestive of malignancy.  No new urinary symptoms.  No bowel changes. Has been compliant with his CPAP machine for his sleep apnea.  INTERVAL   Dalton Cardenas is a wonderful 87 y.o. male who is here today for f/u regarding evaluation and management of DVT occurring while on Eliquis. The patient's last visit with [place] was on 11/08/2020. The pt reports that he is doing well overall.  The pt reports resolved left thigh and calf pain. No new leg swelling. Still having left knee discomfort but its improving after orthopedic evaluation. Labs reviewed with patient -- no overt evidence of hypercoag state. Pending IVC [place] and [place] venous reflux studies  On review of systems, pt reports no chest pain or shortness of breath.  MEDICAL HISTORY:  Past Medical History:  Diagnosis Date  . Coronary artery disease    60-70% circumflex (cath in [place] 08/2010).  Marland Kitchen Dysrhythmia    A history of A flutter and tachycardia (SVT)  . GERD (gastroesophageal  reflux disease)   . Hypercholesterolemia   . Hypertension   . Prostate cancer [facility])    status post prostatectomy  . Sleep apnea   . 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 SVT (supraventricular tachycardia) ([place])    a. Holter monitoring 2011 - AV node  reentry, multifocal atrial tachycardia, and resting bradycardia. b. previously tx with [facility] in [place].    SURGICAL HISTORY: Past Surgical History:  Procedure Laterality Date  . ABLATION OF DYSRHYTHMIC FOCUS  10/13/2014   SVT         by Dr Lovena Le  . ANAL FISSURE REPAIR    . CARDIOVERSION N/A 07/01/2020   Procedure: CARDIOVERSION;  Surgeon: Adrian Prows, MD;  Location: [place];  Service: Cardiovascular;  Laterality: N/A;  . ELECTROPHYSIOLOGY STUDY N/A 10/13/2014   Procedure: ELECTROPHYSIOLOGY STUDY;  Surgeon: Evans Lance, MD;  Location: [facility] CATH LAB;  Service: Cardiovascular;  Laterality: N/A;  . EYE SURGERY Bilateral 2011   cataract   . PROSTATECTOMY    . SUPRAVENTRICULAR TACHYCARDIA ABLATION N/A 10/13/2014   Procedure: SUPRAVENTRICULAR TACHYCARDIA ABLATION;  Surgeon: Evans Lance, MD;  Location: [facility] CATH LAB;  Service: Cardiovascular;  Laterality: N/A;  . TRANSCAROTID ARTERY REVASCULARIZATION (TCAR) Left 08/01/2020  . TRANSCAROTID ARTERY REVASCULARIZATION Left 08/01/2020   Procedure: LEFT TRANSCAROTID ARTERY REVASCULARIZATION;  Surgeon: Elam Dutch, MD;  Location: [place];  Service: Vascular;  Laterality: Left;  . ULTRASOUND GUIDANCE FOR VASCULAR ACCESS Right 08/01/2020   Procedure: ULTRASOUND GUIDANCE FOR VASCULAR ACCESS;  Surgeon: Elam Dutch, MD;  Location: [facility] OR;  Service: Vascular;  Laterality: Right;    SOCIAL HISTORY: Social History   Socioeconomic History  . Marital status: Married    Spouse name: Not on file  . Number of children: 4  . Years of education: Not on file  . Highest education level: Not on file  Occupational History  . Occupation: Retired  Tobacco Use  . Smoking status: Never Smoker  . Smokeless tobacco: Never Used  Vaping Use  . Vaping Use: Never used  Substance and Sexual Activity  . Alcohol use: No  . Drug use: No  . Sexual activity: Not Currently  Other Topics Concern  . Not on file  Social History Narrative   Regular exercise:  Yes   Social Determinants of Health   Financial Resource Strain: Not on file  Food Insecurity: Not on file  Transportation Needs: Not on file  Physical Activity: Not on file  Stress: Not on file  Social Connections: Not on file  Intimate Partner Violence: Not on file    FAMILY HISTORY: Family History  Problem Relation Age of Onset  . Hypertension Mother   . Heart disease Father   . Hypertension Sister   . Hypertension Brother   . Ulcers Sister   . Hypertension Sister   . Diabetes Neg Hx   . Coronary artery disease Neg Hx     ALLERGIES:  is allergic to amoxicillin, ibuprofen, penicillins, ace inhibitors, amlodipine, spironolactone, atorvastatin, azithromycin, diltiazem, and hydrochlorothiazide.  MEDICATIONS:  Current Outpatient Medications  Medication Sig Dispense Refill  . acebutolol (SECTRAL) 200 MG capsule Take 1 capsule (200 mg total) by mouth 2 (two) times daily. 180 capsule 3  . ALPRAZolam (XANAX) 0.25 MG tablet Take 1 tablet (0.25 mg total) by mouth at bedtime as needed for sleep. 30 tablet 0  . apixaban (ELIQUIS) 5 MG TABS tablet Take 1 tablet (  5 mg total) by mouth 2 (two) times daily. 180 tablet 2  . aspirin EC 81 MG tablet Take 1 tablet (81 mg total) by mouth daily. 70 tablet 1  . b complex vitamins tablet Take 1 tablet by mouth daily.     . Cholecalciferol (VITAMIN D) 125 MCG (5000 UT) CAPS Take 5,000 Units by mouth daily.     . Coenzyme Q10 (CO Q-10) 200 MG CAPS Take 200 mg by mouth daily.     . Cyanocobalamin (B-12) 5000 MCG CAPS Take 5,000 mcg by mouth daily.    . Ferrous Sulfate 27 MG TABS Take 27 mg by mouth daily.    . folic acid (FOLVITE) 462 MCG tablet Take 800 mcg by mouth daily.    . hydrALAZINE (APRESOLINE) 50 MG tablet Take 1 tablet (50 mg total) by mouth in the morning, at noon, in the evening, and at bedtime. Take extra one if BP >140/70 270 tablet 3  . L-Arginine 1000 MG TABS Take 1,000 mg by mouth daily.     . LUTEIN PO Take 1 tablet by mouth  daily.     . Magnesium 250 MG TABS Take 500 mg by mouth daily.     . melatonin 5 MG TABS Take 1 tablet by mouth daily as needed.    Marland Kitchen omega-3 acid ethyl esters (LOVAZA) 1 g capsule Take 1 g by mouth daily.    Marland Kitchen PAPAYA ENZYME PO Take 1 tablet by mouth daily.    . Probiotic Product (ALIGN PO) Take 1 capsule by mouth daily.     . PSYLLIUM PO Take 1 tablet by mouth daily.     . rosuvastatin (CRESTOR) 10 MG tablet Take 10 mg by mouth daily.      . valsartan (DIOVAN) 160 MG tablet Take 1 tablet (160 mg total) by mouth daily. 90 tablet 3  . Vitamin A 2400 MCG (8000 UT) CAPS Take 8,000 Units by mouth daily.    . vitamin C (ASCORBIC ACID) 500 MG tablet Take 500 mg by mouth daily.    Marland Kitchen zinc gluconate 50 MG tablet Take 50 mg by mouth daily.     No current facility-administered medications for this visit.    REVIEW OF SYSTEMS:   10 Point review of Systems was done is negative except as noted above.  PHYSICAL EXAMINATION: ECOG PERFORMANCE STATUS: 1 - Symptomatic but completely ambulatory  . Vitals:   12/20/20 1510  BP: (!) 200/77  Pulse: 64  Resp: 18  Temp: (!) 97.1 F (36.2 C)  SpO2: 100%   Filed Weights   12/20/20 1510  Weight: 160 lb (72.6 kg)   .Body mass index is 25.06 kg/m.  NAD GENERAL:alert, in no acute distress and comfortable SKIN: no acute rashes, no significant lesions EYES: conjunctiva are pink and non-injected, sclera anicteric OROPHARYNX: MMM, no exudates, no oropharyngeal erythema or ulceration NECK: supple, no JVD LYMPH:  no palpable lymphadenopathy in the cervical, axillary or inguinal regions LUNGS: clear to auscultation b/l with normal respiratory effort HEART: regular rate & rhythm ABDOMEN:  normoactive bowel sounds , non tender, not distended. Extremity: no pedal edema PSYCH: alert & oriented x 3 with fluent speech NEURO: no focal motor/sensory deficits   LABORATORY DATA:  I have reviewed the data as listed  . CBC Latest Ref Rng & Units 11/08/2020  08/02/2020 08/01/2020  WBC 4.0 - 10.5 K/uL 8.2 9.0 11.5(H)  Hemoglobin 13.0 - 17.0 g/dL 11.8(L) 10.2(L) 13.2  Hematocrit 39.0 - 52.0 % 36.1(L) 30.1(L)  39.4  Platelets 150 - 400 K/uL 267 220 255    . CMP Latest Ref Rng & Units 11/08/2020 08/02/2020 08/01/2020  Glucose 70 - 99 mg/dL 102(H) 98 -  BUN 8 - 23 mg/dL 20 13 -  Creatinine 0.61 - 1.24 mg/dL 1.04 1.02 0.96  Sodium 135 - 145 mmol/L 136 132(L) -  Potassium 3.5 - 5.1 mmol/L 4.8 4.3 -  Chloride 98 - 111 mmol/L 104 104 -  CO2 22 - 32 mmol/L 24 22 -  Calcium 8.9 - 10.3 mg/dL 9.4 7.7(L) -  Total Protein 6.5 - 8.1 g/dL 7.5 - -  Total Bilirubin 0.3 - 1.2 mg/dL 0.9 - -  Alkaline Phos 38 - 126 U/L 71 - -  AST 15 - 41 U/L 23 - -  ALT 0 - 44 U/L 21 - -   Component     Latest Ref Rng & Units 11/08/2020  WBC     4.0 - 10.5 K/uL 8.2  RBC     4.22 - 5.81 MIL/uL 3.93 (L)  Hemoglobin     13.0 - 17.0 g/dL 11.8 (L)  HCT     39.0 - 52.0 % 36.1 (L)  MCV     80.0 - 100.0 fL 91.9  MCH     26.0 - 34.0 pg 30.0  MCHC     30.0 - 36.0 g/dL 32.7  RDW     11.5 - 15.5 % 13.6  Platelets     150 - 400 K/uL 267  nRBC     0.0 - 0.2 % 0.0  Neutrophils     % 67  NEUT#     1.7 - 7.7 K/uL 5.4  Lymphocytes     % 20  Lymphocyte #     0.7 - 4.0 K/uL 1.6  Monocytes Relative     % 10  Monocyte #     0.1 - 1.0 K/uL 0.8  Eosinophil     % 2  Eosinophils Absolute     0.0 - 0.5 K/uL 0.2  Basophil     % 1  Basophils Absolute     0.0 - 0.1 K/uL 0.1  Immature Granulocytes     % 0  Abs Immature Granulocytes     0.00 - 0.07 K/uL 0.02  Sodium     135 - 145 mmol/L 136  Potassium     3.5 - 5.1 mmol/L 4.8  Chloride     98 - 111 mmol/L 104  CO2     22 - 32 mmol/L 24  Glucose     70 - 99 mg/dL 102 (H)  BUN     8 - 23 mg/dL 20  Creatinine     0.61 - 1.24 mg/dL 1.04  Calcium     8.9 - 10.3 mg/dL 9.4  Total Protein     6.5 - 8.1 g/dL 7.5  Albumin     3.5 - 5.0 g/dL 3.7  AST     15 - 41 U/L 23  ALT     0 - 44 U/L 21  Alkaline  Phosphatase     38 - 126 U/L 71  Total Bilirubin     0.3 - 1.2 mg/dL 0.9  GFR, Est Non African American     >60 mL/min >60  Anion gap     5 - 15 8  IgG (Immunoglobin G), Serum     603 - 1,613 mg/dL 1,068  IgA     61 - 437 mg/dL 169  IgM (Immunoglobulin M), Srm     15 - 143 mg/dL 197 (H)  Total Protein ELP     6.0 - 8.5 g/dL 6.9  Albumin SerPl Elph-Mcnc     2.9 - 4.4 g/dL 3.8  Alpha 1     0.0 - 0.4 g/dL 0.3  Alpha2 Glob SerPl Elph-Mcnc     0.4 - 1.0 g/dL 0.7  B-Globulin SerPl Elph-Mcnc     0.7 - 1.3 g/dL 1.0  Gamma Glob SerPl Elph-Mcnc     0.4 - 1.8 g/dL 1.1  M Protein SerPl Elph-Mcnc     Not Observed g/dL 0.3 (H)  Globulin, Total     2.2 - 3.9 g/dL 3.1  Albumin/Glob SerPl     0.7 - 1.7 1.3  IFE 1      Comment (A)  Please Note (HCV):      Comment  PTT Lupus Anticoagulant     0.0 - 51.9 sec 33.6  DRVVT     0.0 - 47.0 sec 50.7 (H)  Lupus Anticoag Interp      Comment:  Beta-2 Glycoprotein I Ab, IgG     0 - 20 GPI IgG units <9  Beta-2-Glycoprotein I IgM     0 - 32 GPI IgM units <9  Beta-2-Glycoprotein I IgA     0 - 25 GPI IgA units 15  Anticardiolipin Ab,IgG,Qn     0 - 14 GPL U/mL <9  Anticardiolipin Ab,IgM,Qn     0 - 12 MPL U/mL <9  Anticardiolipin Ab,IgA,Qn     0 - 11 APL U/mL <9  Uric Acid, Serum     3.7 - 8.6 mg/dL 7.1  D-Dimer, Quant     0.00 - 0.50 ug/mL-FEU 1.30 (H)  Sed Rate     0 - 16 mm/hr 57 (H)  dRVVT Mix     0.0 - 40.4 sec 44.5 (H)  dRVVT Confirm     0.8 - 1.2 ratio 1.0    RADIOGRAPHIC STUDIES: I have personally reviewed the radiological images as listed and agreed with the findings in the report. No results found.  ASSESSMENT & PLAN:   Dalton Cardenas is a very pleasant 87 year old gentleman with multiple medical issues including hypertension, dyslipidemia, sleep apnea on CPAP, dysrhythmias, coronary artery disease status post stenting internal carotid artery stenting on 08/01/2020 with  #1 Age-indeterminate left mid femoral vein DVT on  10/28/2020  this DVT occurred while on aspirin plus Eliquis. In the context of some knee injury and left knee swelling of unclear etiology which might serve as a local risk factor for inflammation. Patient was also noted to have superficial venous thrombosis phlebitis which was read age-indeterminate possibly chronic.  This might suggest the presence of possible varicosities and local venous risk factors causing venous blood pooling.  Patient did have a repeat venous ultrasound of the left lower extremity after about 10 days and after having had his Eliquis dose increased to 10 mg p.o. twice daily and there has been resolution of his left mid femoral vein DVT and improvement in his symptoms.  PLAN: -Discussed pt labwork today, 12/20/2020; no overt hypercoag disorder noted on setting. -improving symptoms of left thigh pain and swelling -US venous IVC and venous reflux studies --pending ordered. -We discussed risk mitigation strategies including maintaining adequate hydration, staying physically active, optimizing sleep apnea management, using knee-high compression socks.   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  #3 Uncontrolled HTN --  no CP/SOB -continue to optimize with cardiology and PCP    FOLLOW UP: US venous reflux studies and [place] ilaics/IC in 5-7 days Phone visit in 10-12 days   All of the patients questions were answered with apparent satisfaction. The patient knows to call the clinic with any problems, questions or concerns.   The total time spent in the appointment was 20 minutes and more than 50% was on counseling and direct patient cares.     Sullivan Lone MD [place] AAHIVMS [facility] Hematology/Oncology Physician [facility]  (Office):       [phone] (Work cell):  [phone] (Fax):           [phone]  I, [name], am acting as scribe for Dr. Sullivan Lone, MD.   .I have reviewed the above documentation  for accuracy and completeness, and I agree with the above. Brunetta Genera MD

## 2020-12-20 ENCOUNTER — Inpatient Hospital Stay: Payer: Medicare Other | Attending: Hematology | Admitting: Hematology

## 2020-12-20 ENCOUNTER — Other Ambulatory Visit: Payer: Self-pay

## 2020-12-20 VITALS — BP 200/77 | HR 64 | Temp 97.1°F | Resp 18 | Ht 67.0 in | Wt 160.0 lb

## 2020-12-20 DIAGNOSIS — I471 Supraventricular tachycardia: Secondary | ICD-10-CM | POA: Diagnosis not present

## 2020-12-20 DIAGNOSIS — Z7901 Long term (current) use of anticoagulants: Secondary | ICD-10-CM | POA: Insufficient documentation

## 2020-12-20 DIAGNOSIS — I10 Essential (primary) hypertension: Secondary | ICD-10-CM | POA: Insufficient documentation

## 2020-12-20 DIAGNOSIS — E7800 Pure hypercholesterolemia, unspecified: Secondary | ICD-10-CM | POA: Insufficient documentation

## 2020-12-20 DIAGNOSIS — D6859 Other primary thrombophilia: Secondary | ICD-10-CM | POA: Diagnosis not present

## 2020-12-20 DIAGNOSIS — D472 Monoclonal gammopathy: Secondary | ICD-10-CM | POA: Diagnosis not present

## 2020-12-20 DIAGNOSIS — I82409 Acute embolism and thrombosis of unspecified deep veins of unspecified lower extremity: Secondary | ICD-10-CM | POA: Insufficient documentation

## 2020-12-20 DIAGNOSIS — Z8546 Personal history of malignant neoplasm of prostate: Secondary | ICD-10-CM | POA: Insufficient documentation

## 2020-12-20 DIAGNOSIS — I6522 Occlusion and stenosis of left carotid artery: Secondary | ICD-10-CM | POA: Diagnosis not present

## 2020-12-20 DIAGNOSIS — I2510 Atherosclerotic heart disease of native coronary artery without angina pectoris: Secondary | ICD-10-CM | POA: Diagnosis not present

## 2020-12-20 DIAGNOSIS — K219 Gastro-esophageal reflux disease without esophagitis: Secondary | ICD-10-CM | POA: Insufficient documentation

## 2020-12-20 DIAGNOSIS — E785 Hyperlipidemia, unspecified: Secondary | ICD-10-CM | POA: Diagnosis not present

## 2020-12-20 DIAGNOSIS — I82412 Acute embolism and thrombosis of left femoral vein: Secondary | ICD-10-CM | POA: Diagnosis not present

## 2020-12-20 DIAGNOSIS — Z79899 Other long term (current) drug therapy: Secondary | ICD-10-CM | POA: Insufficient documentation

## 2020-12-21 DIAGNOSIS — M25562 Pain in left knee: Secondary | ICD-10-CM | POA: Diagnosis not present

## 2020-12-21 DIAGNOSIS — M25462 Effusion, left knee: Secondary | ICD-10-CM | POA: Diagnosis not present

## 2020-12-23 ENCOUNTER — Ambulatory Visit (HOSPITAL_COMMUNITY)
Admission: RE | Admit: 2020-12-23 | Discharge: 2020-12-23 | Disposition: A | Discharge status: Home / Self Care | Payer: Medicare Other | Source: Ambulatory Visit | Attending: Hematology | Admitting: Hematology

## 2020-12-23 ENCOUNTER — Encounter: Payer: Self-pay | Admitting: Hematology

## 2020-12-23 ENCOUNTER — Other Ambulatory Visit: Payer: Self-pay

## 2020-12-23 DIAGNOSIS — D6859 Other primary thrombophilia: Secondary | ICD-10-CM | POA: Diagnosis not present

## 2020-12-23 DIAGNOSIS — I82412 Acute embolism and thrombosis of left femoral vein: Secondary | ICD-10-CM

## 2020-12-27 ENCOUNTER — Ambulatory Visit (HOSPITAL_COMMUNITY)
Admission: RE | Admit: 2020-12-27 | Discharge: 2020-12-27 | Disposition: A | Discharge status: Home / Self Care | Payer: Medicare Other | Source: Ambulatory Visit | Attending: Hematology | Admitting: Hematology

## 2020-12-27 ENCOUNTER — Other Ambulatory Visit: Payer: Self-pay

## 2020-12-27 DIAGNOSIS — I82412 Acute embolism and thrombosis of left femoral vein: Secondary | ICD-10-CM | POA: Diagnosis not present

## 2020-12-27 DIAGNOSIS — D6859 Other primary thrombophilia: Secondary | ICD-10-CM | POA: Insufficient documentation

## 2020-12-28 ENCOUNTER — Telehealth: Payer: Self-pay

## 2020-12-28 DIAGNOSIS — M25462 Effusion, left knee: Secondary | ICD-10-CM | POA: Diagnosis not present

## 2020-12-28 DIAGNOSIS — M25562 Pain in left knee: Secondary | ICD-10-CM | POA: Diagnosis not present

## 2020-12-28 NOTE — Telephone Encounter (Signed)
Contacted pt to let him know he did not have any DVTs but does have venous reflux. Encouraged pt to wear compression socks. Informed pt Dr Irene Limbo would also be contacting him. Pt verbalized understanding.

## 2021-01-04 DIAGNOSIS — M25562 Pain in left knee: Secondary | ICD-10-CM | POA: Diagnosis not present

## 2021-01-04 DIAGNOSIS — M25462 Effusion, left knee: Secondary | ICD-10-CM | POA: Diagnosis not present

## 2021-01-05 DIAGNOSIS — E7800 Pure hypercholesterolemia, unspecified: Secondary | ICD-10-CM | POA: Diagnosis not present

## 2021-01-05 DIAGNOSIS — K219 Gastro-esophageal reflux disease without esophagitis: Secondary | ICD-10-CM | POA: Diagnosis not present

## 2021-01-05 DIAGNOSIS — I10 Essential (primary) hypertension: Secondary | ICD-10-CM | POA: Diagnosis not present

## 2021-01-08 NOTE — Progress Notes (Signed)
HEMATOLOGY/ONCOLOGY CLINIC NOTE  Date of Service: 01/09/2021  Patient Care Team: Rankins, Bill Salinas, MD as PCP - General (Family Medicine)  CHIEF COMPLAINTS/PURPOSE OF CONSULTATION:  DVT  HISTORY OF PRESENTING ILLNESS:   Dalton Cardenas is a wonderful 87 y.o. male who has been referred to [place] by Dr. Elsworth Soho for evaluation and management of DVT occurring while on ELiquis.   Patient has a history of hypertension, dyslipidemia, coronary artery disease status post cath and 2011, remote history of prostate cancer status post prostatectomy, sleep apnea on CPAP, atrial flutter on chronic Eliquis, previous history of SVT/multifocal atrial tachycardia.  Patient was noted to have greater than 80% left internal carotid artery stenosis after follow-up for an asymptomatic carotid bruit.  No previous history of TIA or strokes.  He underwent a left transcarotid artery revascularization on 08/01/2020 by Dr. Oneida Alar with stenting and was on aspirin plus Plavix plus Eliquis postprocedure.  The Plavix was discontinued 2 months after the procedure.  Patient presented on 10/24/2020 with left lower extremity swelling and had a venous ultrasound which showed an age-indeterminate DVT involving the left femoral vein and age indeterminate superficial venous thrombosis involving the left small saphenous vein.  His Eliquis was increased to 10 mg p.o. twice daily for a week.  He had a repeat venous ultrasound on 11/03/2020 which showed chronic superficial venous thrombosis involving the left small saphenous vein and no evidence of DVT in his left lower extremity.  Mid femoral vein thrombus was noted to have resolved.  Patient notes that a few days prior to the leg swelling he also had a minor fall and did bump his left knee which has been swollen and that he has follow-up with orthopedics to evaluate this further.  Patient reports no previous history of venous thromboembolism or unusual arterial thrombosis.  No family  history of VTE.  On review of systems, pt reports some shortness of breath and did see his cardiologist on 10/31/2020 and this was thought to be related to elevated blood pressures hypertensives were adjusted.  There was also an thought that there was a significant element of anxiety involved .with cardiology for further evaluation management of shortness of breath . Patient was seen by Dr. Elsworth Soho from pulmonary and no concern at that time was noted for PE. Patient did subsequently have a CTA of the chest after our visit ordered by cardiology on 2/25 which showed no evidence of PE.  Patient denies any weight loss or other new focal symptoms suggestive of malignancy.  No new urinary symptoms.  No bowel changes. Has been compliant with his CPAP machine for his sleep apnea.  INTERVAL HISTORY  I connected with [name] on 01/09/2021 by telephone and verified that I am speaking with the correct person using two identifiers.   I discussed the limitations of evaluation and management by telemedicine. The patient expressed understanding and agreed to proceed.   Other persons participating in the visit and their role in the encounter:                                                         - [name], Medical Scribe            - Ms. Stoneberg, Pt's Wife     Patient's location: Home Provider's location:  [place] [facility] [place] is a wonderful 87 y.o. male who is here today for f/u regarding evaluation and management of DVT occurring while on Eliquis. The patient's last visit with [place] was on 12/20/2020. The pt reports that he is doing well overall.  The pt reports no new symptoms or concerns. The pt notes that he still has significant thigh swelling and leg swelling. The pt notes he currently has 15-20 mm thigh high compression socks that he uses when active. The pt notes that he is thinking about traveling back to [place] sometime in the near future. The pt notes that his knee hurts when he is  getting up and down, b/l. The pt notes that he commonly feels the reflux up in the thigh.  Of note since the patient's last visit, pt has had VAS [place] Lower extremities on 12/27/2020, which revealed " Right: - No evidence of superficial venous thrombosis in the right lower extremity. - Venous reflux is noted in the right sapheno-femoral junction. - Venous reflux is noted in the right greater saphenous vein in the thigh. - Venous reflux is noted in the right greater saphenous vein in the calf. - Venous reflux is noted in the right short saphenous vein. Left: - Color duplex evaluation of the left lower extremity shows there is chronic thrombus in the lesser saphenous vein. - Venous reflux is noted in the left sapheno-femoral junction. - Venous reflux is noted in the left greater saphenous vein in the thigh. - Venous reflux is noted in the left greater saphenous vein in the calf. - Venous reflux is noted in the left short saphenous vein."  On review of systems, pt reports thigh and leg swelling and denies any other symptoms.  MEDICAL HISTORY:  Past Medical History:  Diagnosis Date  . Coronary artery disease    60-70% circumflex (cath in [place] 08/2010).  Marland Kitchen Dysrhythmia    A history of A flutter and tachycardia (SVT)  . GERD (gastroesophageal reflux disease)   . Hypercholesterolemia   . Hypertension   . Prostate cancer [facility])    status post prostatectomy  . Sleep apnea   . 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 SVT (supraventricular tachycardia) ([place])    a. Holter monitoring 2011 - AV node reentry, multifocal atrial tachycardia, and resting bradycardia. b. previously tx with [facility] in [place].    SURGICAL HISTORY: Past Surgical History:  Procedure Laterality Date  . ABLATION OF DYSRHYTHMIC FOCUS  10/13/2014   SVT         by Dr Lovena Le  . ANAL FISSURE REPAIR    . CARDIOVERSION N/A 07/01/2020   Procedure:  CARDIOVERSION;  Surgeon: Adrian Prows, MD;  Location: [place];  Service: Cardiovascular;  Laterality: N/A;  . ELECTROPHYSIOLOGY STUDY N/A 10/13/2014   Procedure: ELECTROPHYSIOLOGY STUDY;  Surgeon: Evans Lance, MD;  Location: [facility] CATH LAB;  Service: Cardiovascular;  Laterality: N/A;  . EYE SURGERY Bilateral 2011   cataract   . PROSTATECTOMY    . SUPRAVENTRICULAR TACHYCARDIA ABLATION N/A 10/13/2014   Procedure: SUPRAVENTRICULAR TACHYCARDIA ABLATION;  Surgeon: Evans Lance, MD;  Location: [facility] CATH LAB;  Service: Cardiovascular;  Laterality: N/A;  . TRANSCAROTID ARTERY REVASCULARIZATION (TCAR) Left 08/01/2020  . TRANSCAROTID ARTERY REVASCULARIZATION Left 08/01/2020   Procedure: LEFT TRANSCAROTID ARTERY REVASCULARIZATION;  Surgeon: Elam Dutch, MD;  Location: [place];  Service: Vascular;  Laterality: Left;  . ULTRASOUND GUIDANCE  FOR VASCULAR ACCESS Right 08/01/2020   Procedure: ULTRASOUND GUIDANCE FOR VASCULAR ACCESS;  Surgeon: Elam Dutch, MD;  Location: [facility] OR;  Service: Vascular;  Laterality: Right;    SOCIAL HISTORY: Social History   Socioeconomic History  . Marital status: Married    Spouse name: Not on file  . Number of children: 4  . Years of education: Not on file  . Highest education level: Not on file  Occupational History  . Occupation: Retired  Tobacco Use  . Smoking status: Never Smoker  . Smokeless tobacco: Never Used  Vaping Use  . Vaping Use: Never used  Substance and Sexual Activity  . Alcohol use: No  . Drug use: No  . Sexual activity: Not Currently  Other Topics Concern  . Not on file  Social History Narrative   Regular exercise: Yes   Social Determinants of Health   Financial Resource Strain: Not on file  Food Insecurity: Not on file  Transportation Needs: Not on file  Physical Activity: Not on file  Stress: Not on file  Social Connections: Not on file  Intimate Partner Violence: Not on file    FAMILY HISTORY: Family History  Problem  Relation Age of Onset  . Hypertension Mother   . Heart disease Father   . Hypertension Sister   . Hypertension Brother   . Ulcers Sister   . Hypertension Sister   . Diabetes Neg Hx   . Coronary artery disease Neg Hx     ALLERGIES:  is allergic to amoxicillin, ibuprofen, penicillins, ace inhibitors, amlodipine, spironolactone, atorvastatin, azithromycin, diltiazem, and hydrochlorothiazide.  MEDICATIONS:  Current Outpatient Medications  Medication Sig Dispense Refill  . acebutolol (SECTRAL) 200 MG capsule Take 1 capsule (200 mg total) by mouth 2 (two) times daily. 180 capsule 3  . ALPRAZolam (XANAX) 0.25 MG tablet Take 1 tablet (0.25 mg total) by mouth at bedtime as needed for sleep. 30 tablet 0  . apixaban (ELIQUIS) 5 MG TABS tablet Take 1 tablet (5 mg total) by mouth 2 (two) times daily. 180 tablet 2  . aspirin EC 81 MG tablet Take 1 tablet (81 mg total) by mouth daily. 70 tablet 1  . b complex vitamins tablet Take 1 tablet by mouth daily.     . Cholecalciferol (VITAMIN D) 125 MCG (5000 UT) CAPS Take 5,000 Units by mouth daily.     . Coenzyme Q10 (CO Q-10) 200 MG CAPS Take 200 mg by mouth daily.     . Cyanocobalamin (B-12) 5000 MCG CAPS Take 5,000 mcg by mouth daily.    . Ferrous Sulfate 27 MG TABS Take 27 mg by mouth daily.    . folic acid (FOLVITE) Q000111Q MCG tablet Take 800 mcg by mouth daily.    . hydrALAZINE (APRESOLINE) 50 MG tablet Take 1 tablet (50 mg total) by mouth in the morning, at noon, in the evening, and at bedtime. Take extra one if BP >140/70 270 tablet 3  . L-Arginine 1000 MG TABS Take 1,000 mg by mouth daily.     . LUTEIN PO Take 1 tablet by mouth daily.     . Magnesium 250 MG TABS Take 500 mg by mouth daily.     . melatonin 5 MG TABS Take 1 tablet by mouth daily as needed.    Marland Kitchen omega-3 acid ethyl esters (LOVAZA) 1 g capsule Take 1 g by mouth daily.    Marland Kitchen PAPAYA ENZYME PO Take 1 tablet by mouth daily.    . Probiotic Product (  ALIGN PO) Take 1 capsule by mouth daily.      . PSYLLIUM PO Take 1 tablet by mouth daily.     . rosuvastatin (CRESTOR) 10 MG tablet Take 10 mg by mouth daily.      . valsartan (DIOVAN) 160 MG tablet Take 1 tablet (160 mg total) by mouth daily. 90 tablet 3  . Vitamin A 2400 MCG (8000 UT) CAPS Take 8,000 Units by mouth daily.    . vitamin C (ASCORBIC ACID) 500 MG tablet Take 500 mg by mouth daily.    Marland Kitchen zinc gluconate 50 MG tablet Take 50 mg by mouth daily.     No current facility-administered medications for this visit.    REVIEW OF SYSTEMS:   10 Point review of Systems was done is negative except as noted above.  PHYSICAL EXAMINATION: ECOG PERFORMANCE STATUS: 1 - Symptomatic but completely ambulatory  Telehealth Visit.   LABORATORY DATA:  I have reviewed the data as listed  . CBC Latest Ref Rng & Units 11/08/2020 08/02/2020 08/01/2020  WBC 4.0 - 10.5 K/uL 8.2 9.0 11.5(H)  Hemoglobin 13.0 - 17.0 g/dL 11.8(L) 10.2(L) 13.2  Hematocrit 39.0 - 52.0 % 36.1(L) 30.1(L) 39.4  Platelets 150 - 400 K/uL 267 220 255    . CMP Latest Ref Rng & Units 11/08/2020 08/02/2020 08/01/2020  Glucose 70 - 99 mg/dL 220(U) 98 -  BUN 8 - 23 mg/dL 20 13 -  Creatinine 5.42 - 1.24 mg/dL 7.06 2.37 6.28  Sodium 135 - 145 mmol/L 136 132(L) -  Potassium 3.5 - 5.1 mmol/L 4.8 4.3 -  Chloride 98 - 111 mmol/L 104 104 -  CO2 22 - 32 mmol/L 24 22 -  Calcium 8.9 - 10.3 mg/dL 9.4 7.7(L) -  Total Protein 6.5 - 8.1 g/dL 7.5 - -  Total Bilirubin 0.3 - 1.2 mg/dL 0.9 - -  Alkaline Phos 38 - 126 U/L 71 - -  AST 15 - 41 U/L 23 - -  ALT 0 - 44 U/L 21 - -   Component     Latest Ref Rng & Units 11/08/2020  WBC     4.0 - 10.5 K/uL 8.2  RBC     4.22 - 5.81 MIL/uL 3.93 (L)  Hemoglobin     13.0 - 17.0 g/dL 31.5 (L)  HCT     17.6 - 52.0 % 36.1 (L)  MCV     80.0 - 100.0 fL 91.9  MCH     26.0 - 34.0 pg 30.0  MCHC     30.0 - 36.0 g/dL 16.0  RDW     73.7 - 10.6 % 13.6  Platelets     150 - 400 K/uL 267  nRBC     0.0 - 0.2 % 0.0  Neutrophils     % 67   NEUT#     1.7 - 7.7 K/uL 5.4  Lymphocytes     % 20  Lymphocyte #     0.7 - 4.0 K/uL 1.6  Monocytes Relative     % 10  Monocyte #     0.1 - 1.0 K/uL 0.8  Eosinophil     % 2  Eosinophils Absolute     0.0 - 0.5 K/uL 0.2  Basophil     % 1  Basophils Absolute     0.0 - 0.1 K/uL 0.1  Immature Granulocytes     % 0  Abs Immature Granulocytes     0.00 - 0.07 K/uL 0.02  Sodium  135 - 145 mmol/L 136  Potassium     3.5 - 5.1 mmol/L 4.8  Chloride     98 - 111 mmol/L 104  CO2     22 - 32 mmol/L 24  Glucose     70 - 99 mg/dL 102 (H)  BUN     8 - 23 mg/dL 20  Creatinine     0.61 - 1.24 mg/dL 1.04  Calcium     8.9 - 10.3 mg/dL 9.4  Total Protein     6.5 - 8.1 g/dL 7.5  Albumin     3.5 - 5.0 g/dL 3.7  AST     15 - 41 U/L 23  ALT     0 - 44 U/L 21  Alkaline Phosphatase     38 - 126 U/L 71  Total Bilirubin     0.3 - 1.2 mg/dL 0.9  GFR, Est Non African American     >60 mL/min >60  Anion gap     5 - 15 8  IgG (Immunoglobin G), Serum     603 - 1,613 mg/dL 1,068  IgA     61 - 437 mg/dL 169  IgM (Immunoglobulin M), Srm     15 - 143 mg/dL 197 (H)  Total Protein ELP     6.0 - 8.5 g/dL 6.9  Albumin SerPl Elph-Mcnc     2.9 - 4.4 g/dL 3.8  Alpha 1     0.0 - 0.4 g/dL 0.3  Alpha2 Glob SerPl Elph-Mcnc     0.4 - 1.0 g/dL 0.7  B-Globulin SerPl Elph-Mcnc     0.7 - 1.3 g/dL 1.0  Gamma Glob SerPl Elph-Mcnc     0.4 - 1.8 g/dL 1.1  M Protein SerPl Elph-Mcnc     Not Observed g/dL 0.3 (H)  Globulin, Total     2.2 - 3.9 g/dL 3.1  Albumin/Glob SerPl     0.7 - 1.7 1.3  IFE 1      Comment (A)  Please Note (HCV):      Comment  PTT Lupus Anticoagulant     0.0 - 51.9 sec 33.6  DRVVT     0.0 - 47.0 sec 50.7 (H)  Lupus Anticoag Interp      Comment:  Beta-2 Glycoprotein I Ab, IgG     0 - 20 GPI IgG units <9  Beta-2-Glycoprotein I IgM     0 - 32 GPI IgM units <9  Beta-2-Glycoprotein I IgA     0 - 25 GPI IgA units 15  Anticardiolipin Ab,IgG,Qn     0 - 14 GPL U/mL <9   Anticardiolipin Ab,IgM,Qn     0 - 12 MPL U/mL <9  Anticardiolipin Ab,IgA,Qn     0 - 11 APL U/mL <9  Uric Acid, Serum     3.7 - 8.6 mg/dL 7.1  D-Dimer, Quant     0.00 - 0.50 ug/mL-FEU 1.30 (H)  Sed Rate     0 - 16 mm/hr 57 (H)  dRVVT Mix     0.0 - 40.4 sec 44.5 (H)  dRVVT Confirm     0.8 - 1.2 ratio 1.0    RADIOGRAPHIC STUDIES: I have personally reviewed the radiological images as listed and agreed with the findings in the report. VAS [place] IVC/ILIAC (VENOUS ONLY)  Result Date: 12/23/2020 IVC/ILIAC STUDY Indications: Patient with femoral vein DVT on the left side despite being on              active anticoagulation rule  out May Thurner syndrome Other Factors: 11/03/20 Chronic thrombus SSV. No DVT noted.                10/24/20 DVT mid left FV and SSV. Limitations: Air/bowel gas.  Performing Technologist: Jeb LeveringJill Parker RDMS, RVT  Examination Guidelines: A complete evaluation includes B-mode imaging, spectral Doppler, color Doppler, and power Doppler as needed of all accessible portions of each vessel. Bilateral testing is considered an integral part of a complete examination. Limited examinations for reoccurring indications may be performed as noted.  IVC/Iliac Findings: +----------+------+--------+--------+    IVC    PatentThrombusComments +----------+------+--------+--------+ IVC Prox  patent                 +----------+------+--------+--------+ IVC Mid   patent                 +----------+------+--------+--------+ IVC Distalpatent                 +----------+------+--------+--------+  +-------------------+---------+-----------+---------+-----------+--------+         CIV        RT-PatentRT-ThrombusLT-PatentLT-ThrombusComments +-------------------+---------+-----------+---------+-----------+--------+ Common Iliac Prox                       patent                      +-------------------+---------+-----------+---------+-----------+--------+ Common Iliac Mid                         patent                      +-------------------+---------+-----------+---------+-----------+--------+ Common Iliac Distal                     patent                      +-------------------+---------+-----------+---------+-----------+--------+  +-------------------------+---------+-----------+---------+-----------+--------+            EIV           RT-PatentRT-ThrombusLT-PatentLT-ThrombusComments +-------------------------+---------+-----------+---------+-----------+--------+ External Iliac Vein Prox                      patent                      +-------------------------+---------+-----------+---------+-----------+--------+ External Iliac Vein Mid                       patent                      +-------------------------+---------+-----------+---------+-----------+--------+ External Iliac Vein                           patent                      Distal                                                                    +-------------------------+---------+-----------+---------+-----------+--------+   Summary: IVC/Iliac: There is no evidence of thrombus involving the IVC. There is no evidence of thrombus involving the left common iliac vein. There is  no evidence of thrombus involving the left external iliac vein.  *See table(s) above for measurements and observations.  Electronically signed by Servando Snare MD on 12/23/2020 at 12:19:49 PM.    Final    VAS [place] LOWER EXTREMITY VENOUS REFLUX  Result Date: 12/27/2020  Lower Venous Reflux Study Indications: Swelling. Other Indications: History of deep and superficial venous thrombosis in the left                    lower extremity. Anticoagulation: Eliquis.  Comparison Study: 11/03/2020: Lt- No evidence of deep vein thrombosis. Chronic                   thrombosis in SSV. Performing Technologist: Ivan Croft  Examination Guidelines: A complete evaluation includes B-mode imaging, spectral Doppler, color  Doppler, and power Doppler as needed of all accessible portions of each vessel. Bilateral testing is considered an integral part of a complete examination. Limited examinations for reoccurring indications may be performed as noted. The reflux portion of the exam is performed with the patient in reverse Trendelenburg. Significant venous reflux is defined as >500 ms in the superficial venous system, and >1 second in the deep venous system.  Venous Reflux Times +--------------+---------+------+-----------+------------+--------+ RIGHT         Reflux NoRefluxReflux TimeDiameter cmsComments                         Yes                                  +--------------+---------+------+-----------+------------+--------+ CFV           no                                             +--------------+---------+------+-----------+------------+--------+ FV mid        no                                             +--------------+---------+------+-----------+------------+--------+ Popliteal     no                                             +--------------+---------+------+-----------+------------+--------+ GSV at SFJ              yes    >500 ms      0.72             +--------------+---------+------+-----------+------------+--------+ GSV prox thigh          yes    >500 ms      0.27             +--------------+---------+------+-----------+------------+--------+ GSV mid thigh no                            0.27             +--------------+---------+------+-----------+------------+--------+ GSV dist thighno  0.25             +--------------+---------+------+-----------+------------+--------+ GSV at knee   no                            0.18             +--------------+---------+------+-----------+------------+--------+ GSV prox calf           yes    >500 ms      0.28              +--------------+---------+------+-----------+------------+--------+ SSV Pop Fossa no                            0.17             +--------------+---------+------+-----------+------------+--------+ SSV prox calf no                            0.34             +--------------+---------+------+-----------+------------+--------+ SSV mid calf            yes    >500 ms      0.22             +--------------+---------+------+-----------+------------+--------+  +--------------+---------+------+-----------+------------+----------------+ LEFT          Reflux NoRefluxReflux TimeDiameter cmsComments                                 Yes                                          +--------------+---------+------+-----------+------------+----------------+ CFV           no                                                     +--------------+---------+------+-----------+------------+----------------+ FV mid        no                                                     +--------------+---------+------+-----------+------------+----------------+ Popliteal     no                                                     +--------------+---------+------+-----------+------------+----------------+ GSV at SFJ              yes    >500 ms      0.43                     +--------------+---------+------+-----------+------------+----------------+ GSV prox thighno                            0.44                     +--------------+---------+------+-----------+------------+----------------+  GSV mid thigh           yes    >500 ms      0.35                     +--------------+---------+------+-----------+------------+----------------+ GSV dist thighno                            0.32                     +--------------+---------+------+-----------+------------+----------------+ GSV at knee             yes    >500 ms      0.32                      +--------------+---------+------+-----------+------------+----------------+ GSV prox calf           yes    >500 ms      0.31                     +--------------+---------+------+-----------+------------+----------------+ SSV Pop Fossa           yes    >500 ms      0.17    chronic thrombus +--------------+---------+------+-----------+------------+----------------+ SSV prox calf           yes    >500 ms      0.42    chronic thrombus +--------------+---------+------+-----------+------------+----------------+ SSV mid calf            yes    >500 ms      0.23                     +--------------+---------+------+-----------+------------+----------------+   Summary: Bilateral: - No evidence of deep vein thrombosis seen in the lower extremities, bilaterally, from the common femoral through the popliteal veins.  Right: - No evidence of superficial venous thrombosis in the right lower extremity. - Venous reflux is noted in the right sapheno-femoral junction. - Venous reflux is noted in the right greater saphenous vein in the thigh. - Venous reflux is noted in the right greater saphenous vein in the calf. - Venous reflux is noted in the right short saphenous vein.  Left: - Color duplex evaluation of the left lower extremity shows there is chronic thrombus in the lesser saphenous vein. - Venous reflux is noted in the left sapheno-femoral junction. - Venous reflux is noted in the left greater saphenous vein in the thigh. - Venous reflux is noted in the left greater saphenous vein in the calf. - Venous reflux is noted in the left short saphenous vein.  *See table(s) above for measurements and observations. Electronically signed by Monica Martinez MD on 12/27/2020 at 5:13:45 PM.    Final     ASSESSMENT & PLAN:   Dalton Cardenas is a very pleasant 87 year old gentleman with multiple medical issues including hypertension, dyslipidemia, sleep apnea on CPAP, dysrhythmias, coronary artery disease status post  stenting internal carotid artery stenting on 08/01/2020 with  #1 Age-indeterminate left mid femoral vein DVT on 10/28/2020 - Likely related to severe chronic venous reflux.  this DVT occurred while on aspirin plus Eliquis. In the context of some knee injury and left knee swelling of unclear etiology which might serve as a local risk factor for inflammation. Patient was also noted to have superficial venous thrombosis phlebitis which was read age-indeterminate possibly chronic.  This might suggest the presence of  possible varicosities and local venous risk factors causing venous blood pooling.  Patient did have a repeat venous ultrasound of the left lower extremity after about 10 days and after having had his Eliquis dose increased to 10 mg p.o. twice daily and there has been resolution of his left mid femoral vein DVT and improvement in his symptoms.  PLAN: -Discussed pt VAS [place] Lower extremities on 12/27/2020; no narrowing of blood vessels or clots about the thigh. This was reassuring results. Significant venous reflux in both lower extremities. -Advised pt that venous reflux shows the valves in the veins are not working properly and blood is pooling. This serves as a risk factor for clots. -Recommended use of knee-high compression socks or socks that are thigh high if pt is able to tolerate these. Advised pt to keep legs elevated as much as possible. This should help to mitigate bruising. -Discussed venous stasis dermatitis that could present as what appears to be eczema. This should be improved by the elevation and compression socks. -We discussed risk mitigation strategies including maintaining adequate hydration, staying physically active, optimizing sleep apnea management, using knee-high compression socks. -Advised pt to stay mobile and performing heel raises while on a long flight and during extended travel time when he is immobile. -Recommended pt elevate legs slightly when laying down. Use of  a wedge pillows if bed is not adjustable. Does not have to wear socks when laying down. -Will see back as needed.   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 The patient knows to call the clinic with any problems, questions or concerns.  The total time spent in the appointment was 20 minutes and more than 50% was on counseling and direct patient cares.    Sullivan Lone MD [place] AAHIVMS [facility] Hematology/Oncology Physician [facility]  (Office):       [phone] (Work cell):  [phone] (Fax):           [phone]  I, [name], am acting as scribe for Dr. Sullivan Lone, MD.   .I have reviewed the above documentation for accuracy and completeness, and I agree with the above. Brunetta Genera MD

## 2021-01-09 ENCOUNTER — Inpatient Hospital Stay (HOSPITAL_BASED_OUTPATIENT_CLINIC_OR_DEPARTMENT_OTHER): Payer: Medicare Other | Admitting: Hematology

## 2021-01-09 DIAGNOSIS — D472 Monoclonal gammopathy: Secondary | ICD-10-CM | POA: Diagnosis not present

## 2021-01-09 DIAGNOSIS — I2510 Atherosclerotic heart disease of native coronary artery without angina pectoris: Secondary | ICD-10-CM | POA: Diagnosis not present

## 2021-01-09 DIAGNOSIS — E7800 Pure hypercholesterolemia, unspecified: Secondary | ICD-10-CM | POA: Diagnosis not present

## 2021-01-09 DIAGNOSIS — I10 Essential (primary) hypertension: Secondary | ICD-10-CM | POA: Diagnosis not present

## 2021-01-09 DIAGNOSIS — I82412 Acute embolism and thrombosis of left femoral vein: Secondary | ICD-10-CM

## 2021-01-09 DIAGNOSIS — I471 Supraventricular tachycardia: Secondary | ICD-10-CM | POA: Diagnosis not present

## 2021-01-09 DIAGNOSIS — Z79899 Other long term (current) drug therapy: Secondary | ICD-10-CM | POA: Diagnosis not present

## 2021-01-09 DIAGNOSIS — I6522 Occlusion and stenosis of left carotid artery: Secondary | ICD-10-CM | POA: Diagnosis not present

## 2021-01-09 DIAGNOSIS — I82409 Acute embolism and thrombosis of unspecified deep veins of unspecified lower extremity: Secondary | ICD-10-CM | POA: Diagnosis not present

## 2021-01-09 DIAGNOSIS — K219 Gastro-esophageal reflux disease without esophagitis: Secondary | ICD-10-CM | POA: Diagnosis not present

## 2021-01-09 DIAGNOSIS — E785 Hyperlipidemia, unspecified: Secondary | ICD-10-CM | POA: Diagnosis not present

## 2021-01-09 DIAGNOSIS — Z7901 Long term (current) use of anticoagulants: Secondary | ICD-10-CM | POA: Diagnosis not present

## 2021-01-10 DIAGNOSIS — M25462 Effusion, left knee: Secondary | ICD-10-CM | POA: Diagnosis not present

## 2021-01-10 DIAGNOSIS — M25562 Pain in left knee: Secondary | ICD-10-CM | POA: Diagnosis not present

## 2021-01-11 DIAGNOSIS — Z0000 Encounter for general adult medical examination without abnormal findings: Secondary | ICD-10-CM | POA: Diagnosis not present

## 2021-01-16 DIAGNOSIS — I10 Essential (primary) hypertension: Secondary | ICD-10-CM | POA: Diagnosis not present

## 2021-01-17 DIAGNOSIS — I10 Essential (primary) hypertension: Secondary | ICD-10-CM | POA: Diagnosis not present

## 2021-01-17 DIAGNOSIS — K219 Gastro-esophageal reflux disease without esophagitis: Secondary | ICD-10-CM | POA: Diagnosis not present

## 2021-01-17 DIAGNOSIS — E7800 Pure hypercholesterolemia, unspecified: Secondary | ICD-10-CM | POA: Diagnosis not present

## 2021-01-24 DIAGNOSIS — G4733 Obstructive sleep apnea (adult) (pediatric): Secondary | ICD-10-CM | POA: Diagnosis not present

## 2021-01-24 DIAGNOSIS — E7800 Pure hypercholesterolemia, unspecified: Secondary | ICD-10-CM | POA: Diagnosis not present

## 2021-01-24 DIAGNOSIS — Z7901 Long term (current) use of anticoagulants: Secondary | ICD-10-CM | POA: Diagnosis not present

## 2021-01-24 DIAGNOSIS — I779 Disorder of arteries and arterioles, unspecified: Secondary | ICD-10-CM | POA: Diagnosis not present

## 2021-01-24 DIAGNOSIS — I4892 Unspecified atrial flutter: Secondary | ICD-10-CM | POA: Diagnosis not present

## 2021-01-24 DIAGNOSIS — I10 Essential (primary) hypertension: Secondary | ICD-10-CM | POA: Diagnosis not present

## 2021-01-24 DIAGNOSIS — D472 Monoclonal gammopathy: Secondary | ICD-10-CM | POA: Diagnosis not present

## 2021-02-02 DIAGNOSIS — I10 Essential (primary) hypertension: Secondary | ICD-10-CM | POA: Diagnosis not present

## 2021-02-02 DIAGNOSIS — G4733 Obstructive sleep apnea (adult) (pediatric): Secondary | ICD-10-CM | POA: Diagnosis not present

## 2021-02-02 DIAGNOSIS — D472 Monoclonal gammopathy: Secondary | ICD-10-CM | POA: Diagnosis not present

## 2021-02-02 DIAGNOSIS — E7800 Pure hypercholesterolemia, unspecified: Secondary | ICD-10-CM | POA: Diagnosis not present

## 2021-02-02 DIAGNOSIS — Z7901 Long term (current) use of anticoagulants: Secondary | ICD-10-CM | POA: Diagnosis not present

## 2021-02-15 DIAGNOSIS — I10 Essential (primary) hypertension: Secondary | ICD-10-CM | POA: Diagnosis not present

## 2021-02-18 DIAGNOSIS — I10 Essential (primary) hypertension: Secondary | ICD-10-CM | POA: Diagnosis not present

## 2021-02-20 ENCOUNTER — Ambulatory Visit: Payer: Medicare Other | Admitting: Cardiology

## 2021-02-21 ENCOUNTER — Telehealth: Payer: Self-pay

## 2021-02-21 NOTE — Telephone Encounter (Signed)
Patient called stating that since starting Eliquis, he has been having dark stools and is concerned. He is asking if he needs to stop Eliquis or continue it. Please advise.

## 2021-02-21 NOTE — Telephone Encounter (Signed)
Please advise him to call PCP office let them know he is having dark stool and cardiology office is requesting that he been seen and tested.

## 2021-02-22 NOTE — Telephone Encounter (Signed)
He has contacted them and his PCP stated that the iron she started him for his low hemoglobin, causes dark stools. He said he will see them regardless. Thank you.

## 2021-03-06 DIAGNOSIS — K219 Gastro-esophageal reflux disease without esophagitis: Secondary | ICD-10-CM | POA: Diagnosis not present

## 2021-03-06 DIAGNOSIS — I10 Essential (primary) hypertension: Secondary | ICD-10-CM | POA: Diagnosis not present

## 2021-03-06 DIAGNOSIS — E7800 Pure hypercholesterolemia, unspecified: Secondary | ICD-10-CM | POA: Diagnosis not present

## 2021-03-14 ENCOUNTER — Encounter: Payer: Self-pay | Admitting: Cardiology

## 2021-03-14 ENCOUNTER — Ambulatory Visit: Payer: Medicare Other | Admitting: Cardiology

## 2021-03-14 ENCOUNTER — Emergency Department (HOSPITAL_COMMUNITY)
Admission: EM | Admit: 2021-03-14 | Discharge: 2021-03-15 | Disposition: A | Discharge status: Home / Self Care | Payer: Medicare Other | Attending: Emergency Medicine | Admitting: Emergency Medicine

## 2021-03-14 ENCOUNTER — Other Ambulatory Visit: Payer: Self-pay

## 2021-03-14 VITALS — BP 198/80 | HR 60 | Temp 97.9°F | Resp 17 | Ht 67.0 in | Wt 157.0 lb

## 2021-03-14 DIAGNOSIS — Z95828 Presence of other vascular implants and grafts: Secondary | ICD-10-CM

## 2021-03-14 DIAGNOSIS — I2510 Atherosclerotic heart disease of native coronary artery without angina pectoris: Secondary | ICD-10-CM | POA: Insufficient documentation

## 2021-03-14 DIAGNOSIS — Z8546 Personal history of malignant neoplasm of prostate: Secondary | ICD-10-CM | POA: Insufficient documentation

## 2021-03-14 DIAGNOSIS — Z7982 Long term (current) use of aspirin: Secondary | ICD-10-CM | POA: Insufficient documentation

## 2021-03-14 DIAGNOSIS — Z7901 Long term (current) use of anticoagulants: Secondary | ICD-10-CM | POA: Diagnosis not present

## 2021-03-14 DIAGNOSIS — I499 Cardiac arrhythmia, unspecified: Secondary | ICD-10-CM | POA: Diagnosis not present

## 2021-03-14 DIAGNOSIS — R2242 Localized swelling, mass and lump, left lower limb: Secondary | ICD-10-CM | POA: Diagnosis not present

## 2021-03-14 DIAGNOSIS — R112 Nausea with vomiting, unspecified: Secondary | ICD-10-CM | POA: Insufficient documentation

## 2021-03-14 DIAGNOSIS — I10 Essential (primary) hypertension: Secondary | ICD-10-CM | POA: Insufficient documentation

## 2021-03-14 DIAGNOSIS — Z743 Need for continuous supervision: Secondary | ICD-10-CM | POA: Diagnosis not present

## 2021-03-14 DIAGNOSIS — R6889 Other general symptoms and signs: Secondary | ICD-10-CM | POA: Diagnosis not present

## 2021-03-14 DIAGNOSIS — R404 Transient alteration of awareness: Secondary | ICD-10-CM | POA: Diagnosis not present

## 2021-03-14 DIAGNOSIS — I872 Venous insufficiency (chronic) (peripheral): Secondary | ICD-10-CM

## 2021-03-14 DIAGNOSIS — R55 Syncope and collapse: Secondary | ICD-10-CM | POA: Insufficient documentation

## 2021-03-14 DIAGNOSIS — R61 Generalized hyperhidrosis: Secondary | ICD-10-CM | POA: Insufficient documentation

## 2021-03-14 DIAGNOSIS — R0989 Other specified symptoms and signs involving the circulatory and respiratory systems: Secondary | ICD-10-CM

## 2021-03-14 DIAGNOSIS — M1712 Unilateral primary osteoarthritis, left knee: Secondary | ICD-10-CM | POA: Diagnosis not present

## 2021-03-14 DIAGNOSIS — R42 Dizziness and giddiness: Secondary | ICD-10-CM | POA: Diagnosis not present

## 2021-03-14 DIAGNOSIS — Z79899 Other long term (current) drug therapy: Secondary | ICD-10-CM | POA: Insufficient documentation

## 2021-03-14 MED ORDER — DICLOFENAC SODIUM 1 % EX GEL: 2.0000 g | Freq: Four times a day (QID) | CUTANEOUS | 2 refills | Status: DC

## 2021-03-14 NOTE — Progress Notes (Signed)
Primary Physician/Referring:  Aretta Nip, MD  Patient ID: Dalton Cardenas, male    DOB: [birth year], 87 y.o.   MRN: [id number]  Chief Complaint  Patient presents with   Follow-up   Hypertension   Atrial Fibrillation   Leg Swelling   HPI:    Dalton Cardenas  is a 87 y.o. 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 Cristopher Peru and recommended either ablation or continued medical therapy.  Patient with recurrence of atypical atrial flutter 06/30/2020. Patient underwent left transcarotid revascularization (TCAR), stent left internal carotid artery by Dr. Ruta Hinds 08/01/2020.   He has an appointment to see me in 3 months since I last saw him, however he had called me stating that he has severe pain and swelling in his left leg.   The family also paged me last evening stating that after the office visit, he was at home while having dinner had syncope and was rushed to the emergency room.  He was evaluated in the emergency room and discharged home to be advised to follow-up with [place].  He underwent lower extremity arterial duplex 1 day after I had seen him.  I saw him after the lower extremity venous duplex, and discussed the results.  Wife present at the bedside.  I reviewed their history again regarding syncope.   Past Medical History:  Diagnosis Date   Coronary artery disease    60-70% circumflex (cath in [place] 08/2010).   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 Holter monitoring 2011 - AV node reentry, multifocal atrial  tachycardia, and resting bradycardia. b. previously tx with [place] in [place].   Past Surgical History:  Procedure Laterality Date   ABLATION OF DYSRHYTHMIC FOCUS  10/13/2014   SVT         by Dr Jacquelyne Balint FISSURE REPAIR     CARDIOVERSION N/A 07/01/2020   Procedure: CARDIOVERSION;  Surgeon: Adrian Prows, MD;  Location: [place];  Service: Cardiovascular;  Laterality: N/A;   ELECTROPHYSIOLOGY STUDY N/A 10/13/2014   Procedure: ELECTROPHYSIOLOGY STUDY;  Surgeon: Evans Lance, MD;  Location: [facility] CATH LAB;  Service: Cardiovascular;  Laterality: N/A;   EYE SURGERY Bilateral 2011   cataract    PROSTATECTOMY     SUPRAVENTRICULAR TACHYCARDIA ABLATION N/A 10/13/2014   Procedure: SUPRAVENTRICULAR TACHYCARDIA ABLATION;  Surgeon: Evans Lance, MD;  Location: [facility] CATH LAB;  Service: Cardiovascular;  Laterality: N/A;   TRANSCAROTID ARTERY REVASCULARIZATION (TCAR) Left 08/01/2020   TRANSCAROTID ARTERY REVASCULARIZATION  Left 08/01/2020   Procedure: LEFT TRANSCAROTID ARTERY REVASCULARIZATION;  Surgeon: Elam Dutch, MD;  Location: [place];  Service: Vascular;  Laterality: Left;   ULTRASOUND GUIDANCE FOR VASCULAR ACCESS Right 08/01/2020   Procedure: ULTRASOUND GUIDANCE FOR VASCULAR ACCESS;  Surgeon: Elam Dutch, MD;  Location[facility];  Service: Vascular;  Laterality: Right;   Social History   Tobacco Use   Smoking status: Never   Smokeless tobacco: Never  Substance Use Topics   Alcohol use: No  ROS  Review of Systems  Cardiovascular:  Positive for leg swelling (left knee) and syncope (03/15/2021). Negative for chest pain and dyspnea on exertion.  Musculoskeletal:  Positive for joint pain and joint swelling (left knee).  Gastrointestinal:  Negative for melena.  Objective  Blood pressure (!) 198/80, pulse 60, temperature 97.9 F (36.6 C), temperature source Temporal, resp. rate 17, height 5\' 7"  (1.702 m), weight 157 lb (71.2 kg), SpO2 98 %.  Vitals with BMI 03/14/2021 03/14/2021 12/20/2020   Height - 5\' 7"  5\' 7"   Weight - 157 lbs 160 lbs  BMI - 01.75 10.25  Systolic 852 778 242  Diastolic 80 70 77  Pulse 60 60 64     Physical Exam Vitals reviewed.  Constitutional:      Appearance: Normal appearance.  Neck:     Thyroid: No thyromegaly.  Cardiovascular:     Rate and Rhythm: Normal rate and regular rhythm.     Pulses: Intact distal pulses.     Heart sounds: Normal heart sounds. No murmur heard.   No gallop.     Comments: No JVD. Left carotid endarterectomy.  1+ pitting edema left leg. Trace edema right leg.  Pulmonary:     Effort: Pulmonary effort is normal.     Breath sounds: Normal breath sounds. No wheezing, rhonchi or rales.  Abdominal:     General: Abdomen is flat. Bowel sounds are normal.     Palpations: Abdomen is soft.  Musculoskeletal:        General: Swelling (left knee is swollen and warm and tender) present.     Cervical back: Neck supple.     Right lower leg: Edema (trace) present.     Left lower leg: Edema (1+ pitting) present.  Skin:    General: Skin is warm and dry.  Neurological:     Mental Status: He is alert.   Laboratory examination:   Recent Labs    06/10/20 1637 06/30/20 0532 07/26/20 1513 08/01/20 1419 08/02/20 0500 11/08/20 1417  NA 133*   < > 135  --  132* 136  K 5.1   < > 4.4  --  4.3 4.8  CL 99   < > 103  --  104 104  CO2 23   < > 23  --  22 24  GLUCOSE 87   < > 103*  --  98 102*  BUN 18   < > 16  --  13 20  CREATININE 1.07   < > 1.00 0.96 1.02 1.04  CALCIUM 9.5   < > 9.6  --  7.7* 9.4  GFRNONAA 63   < > >60 >60 >60 >60  GFRAA 73  --   --   --   --   --    < > = values in this interval not displayed.   CrCl cannot be calculated (Patient's most recent lab result is older than the maximum 21 days allowed.).  CMP Latest Ref Rng & Units 11/08/2020 08/02/2020 08/01/2020  Glucose 70 - 99 mg/dL 102(H) 98 -  BUN 8 - 23 mg/dL 20 13 -  Creatinine 0.61 - 1.24 mg/dL 1.04 1.02 0.96  Sodium 135 - 145 mmol/L 136 132(L) -   Potassium 3.5 - 5.1 mmol/L 4.8 4.3 -  Chloride 98 - 111 mmol/L 104 104 -  CO2 22 - 32 mmol/L 24 22 -  Calcium 8.9 - 10.3 mg/dL 9.4 7.7(L) -  Total Protein 6.5 - 8.1 g/dL 7.5 - -  Total  Bilirubin 0.3 - 1.2 mg/dL 0.9 - -  Alkaline Phos 38 - 126 U/L 71 - -  AST 15 - 41 U/L 23 - -  ALT 0 - 44 U/L 21 - -   CBC Latest Ref Rng & Units 11/08/2020 08/02/2020 08/01/2020  WBC 4.0 - 10.5 K/uL 8.2 9.0 11.5(H)  Hemoglobin 13.0 - 17.0 g/dL 11.8(L) 10.2(L) 13.2  Hematocrit 39.0 - 52.0 % 36.1(L) 30.1(L) 39.4  Platelets 150 - 400 K/uL 267 220 255   Lipid Panel  No results found for: CHOL, TRIG, HDL, CHOLHDL, VLDL, LDLCALC, LDLDIRECT HEMOGLOBIN A1C No results found for: HGBA1C, MPG TSH Recent Labs    06/30/20 1406  TSH 1.774    External labs 07/06/2019:   Cholesterol, total 189.000 01/25/2020 HDL 46.000 01/25/2020 LDL-C 123.000 01/25/2020 Triglycerides 108.000 01/25/2020  A1C 6.300 01/25/2020 TSH 4.640 01/25/2020   Hemoglobin 12.800 01/25/2020  Creatinine, Serum 1.150 01/25/2020 Potassium 4.100 01/25/2020 Magnesium N/D ALT (SGPT) 20.000 01/25/2020   Cholesterol, total 281.000 07/06/2019 HDL 42.000 07/06/2019 LDL 206.000 07/06/2019 Triglycerides 168.000 07/06/2019 A1C 5.800 07/06/2019 Hemoglobin 12.800 07/06/2019 Creatinine, Serum 1.140 07/06/2019 Potassium 4.200 07/06/2019 Magnesium N/D ALT (SGPT) 12.000 07/06/2019  Medications and allergies   Allergies  Allergen Reactions   Amoxicillin Diarrhea    GI upset: Bleeding diarrhea  Other reaction(s): GI Bleed   Ibuprofen Diarrhea    Bloody diarrhea    Penicillins Diarrhea    Bloody diarrhea    Ace Inhibitors Swelling    Face swelling    Amlodipine Swelling   Spironolactone     Hyponatremia    Atorvastatin     Weakness    Azithromycin     Stomach ache    Diltiazem Nausea Only    Upset stomach per patient   Hydrochlorothiazide Other (See Comments)    Photosensitivity    Current Outpatient Medications on File Prior  to Visit  Medication Sig Dispense Refill   acebutolol (SECTRAL) 200 MG capsule Take 1 capsule (200 mg total) by mouth 2 (two) times daily. 180 capsule 3   apixaban (ELIQUIS) 5 MG TABS tablet Take 1 tablet (5 mg total) by mouth 2 (two) times daily. 180 tablet 2   aspirin EC 81 MG tablet Take 1 tablet (81 mg total) by mouth daily. 70 tablet 1   b complex vitamins tablet Take 1 tablet by mouth daily.      Cholecalciferol (VITAMIN D) 125 MCG (5000 UT) CAPS Take 5,000 Units by mouth daily.      Coenzyme Q10 (CO Q-10) 200 MG CAPS Take 200 mg by mouth daily.      Cyanocobalamin (B-12) 5000 MCG CAPS Take 5,000 mcg by mouth daily.     Ferrous Sulfate 27 MG TABS Take 27 mg by mouth daily.     folic acid (FOLVITE) 353 MCG tablet Take 800 mcg by mouth daily.     hydrALAZINE (APRESOLINE) 50 MG tablet Take 1 tablet (50 mg total) by mouth in the morning, at noon, in the evening, and at bedtime. Take extra one if BP >140/70 270 tablet 3   L-Arginine 1000 MG TABS Take 1,000 mg by mouth daily.      LUTEIN PO Take 1 tablet by mouth daily.      Magnesium 250 MG TABS Take 500 mg by mouth daily.      melatonin 5 MG TABS Take 1 tablet by mouth daily as needed.     PAPAYA ENZYME PO Take 1 tablet by mouth daily.     Probiotic Product (  ALIGN PO) Take 1 capsule by mouth daily.      rosuvastatin (CRESTOR) 10 MG tablet Take 10 mg by mouth daily.       valsartan (DIOVAN) 160 MG tablet Take 1 tablet (160 mg total) by mouth daily. 90 tablet 3   Vitamin A 2400 MCG (8000 UT) CAPS Take 8,000 Units by mouth daily.     vitamin C (ASCORBIC ACID) 500 MG tablet Take 500 mg by mouth daily.     zinc gluconate 50 MG tablet Take 50 mg by mouth daily.     No current facility-administered medications on file prior to visit.    Radiology:  No results found.   Chest x-ray 06/30/2020: Lungs are clear.  No pleural effusion or pneumothorax. The heart is normal in size.  Thoracic aortic atherosclerosis. Degenerative changes of the  visualized thoracolumbar spine.  CT angiogram chest 11/11/2020: 1. No demonstrable pulmonary embolus. No thoracic aortic aneurysm or dissection. There are foci of aortic atherosclerosis as well as foci of great vessel and coronary artery calcification.   2. Mild atelectatic change in the left lower lobe and inferior lingula. No edema or airspace opacity. Multiple 1-2 mm nodular opacities noted in each upper lobe. No larger pulmonary nodular opacities evident. No follow-up needed if patient is low-risk (and has no known or suspected primary neoplasm). Non-contrast chest CT can be considered in 12 months if patient is high-risk.  Cardiac Studies:  Coronary Angiogram   [2011]: Performed in [place], 70% stenosis in the circumflex coronary artery. Was performed as a part of workup for EP study for the SVT.  Nuclear stress test   [04/26/2010]: Myocardial perfusion scan 04/26/2010: Normal perfusion, EF 77%.  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  Echocardiogram 06/03/2020: Left ventricle cavity is normal in size. Moderate asymmetric hypertrophy of the left ventricle. Basal septum measures at 2.0 cm. Mild LVOT peak gradient 5 mmHg without SAM or LVOT obstruction. Normal wall motion. LVEF 55-60%. Indeterminate diastolic dysfunction. Left atrial cavity is mildly dilated. Trileaflet aortic valve. Trace aortic stenosis. Moderate (Grade II) aortic regurgitation. Mild calcification, No significant stenosis. Moderate (Grade II) mitral regurgitation. Mild tricuspid regurgitation. Estimated pulmonary artery systolic pressure 30 mmHg.  No significant change compared to previous study in 2018.  Carotid artery duplex  06/03/2020: Stenosis in the right internal carotid artery (16-49%). Stenosis in the left internal carotid artery (>=70%). The left PSV internal/common  carotid artery ratio is consistent with a stenosis of >70%. The duplex suggest near subtotal occlusion with soft plaque at the level of the proximal left ICA.  Recommend different modality imaging to confirm severity of the disease.  Follow up study in six months is appropriate if clinically indicated.  CT angio neck 06/28/2020: 1.  Noncalcified atherosclerotic disease in the bifurcation of the right ICA <50% stenosis. 2. 80% stenosis of the proximal left internal carotid artery secondary to noncalcified plaque. 3. 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 4. Aortic Atherosclerosis (ICD10-I70.0).  CT angio head W or WO Contrast 07/12/2020:  No acute intracranial abnormality. Mild chronic microvascular ischemic changes. Chronic right caudate infarct. Short segment marked stenosis of the distal intracranial right vertebral artery.  Transcarotid artery revascularization 08/01/2020: Left transcarotid revascularization (TCAR), stent left internal carotid artery  Carotid Duplex 08/25/2020:  Right Carotid: Velocities in the right ICA are consistent with a 1-39% stenosis. Left Carotid: The ECA appears >50% stenosed. Patent stent with no stenosis noted. Vertebrals: Bilateral vertebral arteries demonstrate antegrade flow. Subclavians: Normal flow hemodynamics were seen in bilateral subclavian arteries.  Lower Venous DVT Study 10/24/2020:  RIGHT:  - No evidence of common femoral vein obstruction.  LEFT:  - Findings consistent with age indeterminate deep vein thrombosis involving the left femoral vein.  - Findings consistent with age indeterminate superficial vein thrombosis involving the left small sahenous vein.  - No cystic structure found in the popliteal fossa.  Venous insufficiency study 12/27/2020: Bilateral iliac veins are patent without thrombosis. There is  venous reflux noted in the right saphenofemoral junction, right greater saphenous vein and right short saphenous vein. There is venous reflux noted in the left greater saphenous vein, left short saphenous vein.  Chronic thrombus in the lesser saphenous vein noted in the left saphenofemoral junction consistent with superficial vein thrombosis.  Left common femoral vein thrombosis noted previously on 11/03/2020 is no longer present.  Lower Extremity Venous Duplex  (Left) 03/15/2021: No evidence of deep vein thrombosis of the left lower extremity with normal venous return. No Baker's cyst left knee.   EKG:    EKG 10/31/20: Sinus bradycardia a 53 bpm with first-degree AV block, left atrial enlargement.  Left axis deviation, left anterior fascicular block.  Poor R wave progression, cannot exclude anteroseptal infarct old. LVH with early repolarization.   EKG 07/11/2020: Sinus rhythm at a rate of 70 bpm with first-degree AV block, left atrial enlargement.  Left axis deviation, left anterior fascicular block.  Poor R wave progression, cannot exclude anteroseptal infarct old.  Compared to EKG/16/2021, no significant change.  Assessment     ICD-10-CM   1. Presence of internal carotid stent: Left TCAR  Z95.828     2. Essential hypertension, benign  I10     3. Venous insufficiency of both lower extremities  I87.2     4. White coat syndrome with diagnosis of hypertension  I10       CHA2DS2-VASc Score is 4.  Yearly risk of stroke: 4.8% (A, HTN, Vasc Dz).  Score of 1=0.6; 2=2.2; 3=3.2; 4=4.8; 5=7.2; 6=9.8; 7=>9.8) -(CHF; HTN; vasc disease DM,  Male = 1; Age <65 =0; 65-74 = 1,  >75 =2; stroke/embolism= 2).    No orders of the defined types were placed in this encounter.  Medications Discontinued During This Encounter  Medication Reason   ALPRAZolam (XANAX) 0.25 MG tablet Error   omega-3 acid ethyl esters (LOVAZA) 1 g capsule Error   PSYLLIUM PO Error    Recommendations:   Dalton Cardenas  is a  87 y.o. 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 Cristopher Peru and recommended either ablation or continued medical therapy.  Patient with recurrence of atypical atrial flutter 06/30/2020. Patient underwent left transcarotid revascularization (TCAR), stent left internal carotid artery by Dr. Ruta Hinds 08/01/2020.   He has an appointment to see me in 3 months since I last saw him, however he had called me stating that he has severe pain and swelling in his left leg.  The swelling appears to be from osteoarthritis and not DVT.  Is concerned about prior DVT, request a venous duplex.  I performed venous duplex the following day, no evidence of DVT or Baker's cyst.  Patient is aware  of the results.  He has an appointment to see orthopedics tomorrow.  I was also paged same day stating that he passed out.  On further questioning, symptoms appears to be vasovagal.  ED records and EKG reviewed.  Do not suspect stroke, PE, heart block.  Patient in significant amount of pain and he was aware that he is going to pass out and in fact was sitting on a stool and gently laid back on the stool next to him.  His blood pressure is markedly elevated.  Suspect it is also related to his acute pain.  He does not wish to make any changes to his medications.  With regard to carotid stenosis, needs surveillance duplex and will be scheduled. Unless he has new issues, I will see him back in 6 months has from cardiac standpoint he has remained stable.  Advised him to continue to monitor his blood pressure closely.  His wife is present at the bedside.  EKG reviewed, no significant abnormality.  With regard to severe osteoarthritis, he does have Percocet at home/oxycodone, advised him to use them for relief but also add psyllium on a regular basis to avoid constipation.  Also prescribed him Voltaren  gel.  This was a 50 minute OV encounter including telephone call regarding syncope.     Adrian Prows, MD, [facility] 03/14/2021, 4:07 PM Office: [phone] Fax: [phone] Pager: [phone]

## 2021-03-15 ENCOUNTER — Ambulatory Visit: Payer: Medicare Other

## 2021-03-15 ENCOUNTER — Other Ambulatory Visit: Payer: Self-pay

## 2021-03-15 ENCOUNTER — Encounter (HOSPITAL_COMMUNITY): Payer: Self-pay

## 2021-03-15 DIAGNOSIS — R2242 Localized swelling, mass and lump, left lower limb: Secondary | ICD-10-CM

## 2021-03-15 DIAGNOSIS — R55 Syncope and collapse: Secondary | ICD-10-CM | POA: Diagnosis not present

## 2021-03-15 DIAGNOSIS — Z7901 Long term (current) use of anticoagulants: Secondary | ICD-10-CM | POA: Diagnosis not present

## 2021-03-15 LAB — CBC WITH DIFFERENTIAL/PLATELET
Abs Immature Granulocytes: 0.03 10*3/uL (ref 0.00–0.07)
Basophils Absolute: 0.1 10*3/uL (ref 0.0–0.1)
Basophils Relative: 1 %
Eosinophils Absolute: 0.2 10*3/uL (ref 0.0–0.5)
Eosinophils Relative: 2 %
HCT: 41 % (ref 39.0–52.0)
Hemoglobin: 14 g/dL (ref 13.0–17.0)
Immature Granulocytes: 0 %
Lymphocytes Relative: 11 %
Lymphs Abs: 1.2 10*3/uL (ref 0.7–4.0)
MCH: 30 pg (ref 26.0–34.0)
MCHC: 34.1 g/dL (ref 30.0–36.0)
MCV: 87.8 fL (ref 80.0–100.0)
Monocytes Absolute: 0.9 10*3/uL (ref 0.1–1.0)
Monocytes Relative: 8 %
Neutro Abs: 8.4 10*3/uL — ABNORMAL HIGH (ref 1.7–7.7)
Neutrophils Relative %: 78 %
Platelets: 243 10*3/uL (ref 150–400)
RBC: 4.67 MIL/uL (ref 4.22–5.81)
RDW: 13.2 % (ref 11.5–15.5)
WBC: 10.8 10*3/uL — ABNORMAL HIGH (ref 4.0–10.5)
nRBC: 0 % (ref 0.0–0.2)

## 2021-03-15 LAB — BASIC METABOLIC PANEL
Anion gap: 9 (ref 5–15)
BUN: 17 mg/dL (ref 8–23)
CO2: 24 mmol/L (ref 22–32)
Calcium: 9.5 mg/dL (ref 8.9–10.3)
Chloride: 102 mmol/L (ref 98–111)
Creatinine, Ser: 1.03 mg/dL (ref 0.61–1.24)
GFR, Estimated: >60 mL/min (ref >60)
Glucose, Bld: 210 mg/dL — ABNORMAL HIGH (ref 70–99)
Potassium: 3.5 mmol/L (ref 3.5–5.1)
Sodium: 135 mmol/L (ref 135–145)

## 2021-03-15 LAB — TROPONIN I (HIGH SENSITIVITY)
Troponin I (High Sensitivity): 16 ng/L (ref <18)
Troponin I (High Sensitivity): 16 ng/L (ref <18)

## 2021-03-15 LAB — LACTIC ACID, PLASMA: Lactic Acid, Venous: 1.9 mmol/L (ref 0.5–1.9)

## 2021-03-15 MED ORDER — LACTATED RINGERS IV BOLUS
1000.0000 mL | Freq: Once | INTRAVENOUS | Status: AC
  Administered 2021-03-15: 1000 mL via INTRAVENOUS

## 2021-03-15 MED ORDER — ONDANSETRON HCL 4 MG/2ML IJ SOLN
4.0000 mg | Freq: Once | INTRAMUSCULAR | Status: AC
  Administered 2021-03-15: 4 mg via INTRAVENOUS
  Filled 2021-03-15: qty 2

## 2021-03-15 NOTE — ED Provider Notes (Signed)
[facility] EMERGENCY DEPARTMENT Provider Note   CSN: [id number] Arrival date & time: 03/14/21  2351     History Chief Complaint  Patient presents with   Dizziness    Diaphoresis, unconscious    Dalton Cardenas is a 87 y.o. male.  The history is provided by the patient.  Dizziness He has history of hypertension hyperlipidemia, coronary artery disease, atrial fibrillation anticoagulated on apixaban and comes in after having had a syncopal episode at home.  He had just taken his nighttime medications and suddenly developed severe dizziness and became diaphoretic and passed out.  Loss of consciousness was estimated at 45 seconds.  His wife caught him as he slumped over, so he did not fall.  He has had nausea and vomiting since then and continues to have nausea.  He denies chest pain, heaviness, tightness, pressure.  Denies any palpitations.   Past Medical History:  Diagnosis Date   Coronary artery disease    60-70% circumflex (cath in [place] 08/2010).   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 Holter monitoring 2011 - AV node reentry, multifocal atrial tachycardia, and resting bradycardia. b. previously tx with Abana in [place].    Patient Active Problem List   Diagnosis Date Noted   DVT (deep venous thrombosis) ([place]) 11/24/2020   Carotid stenosis 08/01/2020   Hyponatremia 06/30/2020   Atrial flutter ([place]) 06/30/2020   Chronic cough 11/26/2019   Neck pain 07/02/2018   SVT (supraventricular tachycardia) ([place]) 10/13/2014   Syncope 07/22/2012   Coronary artery disease    CHEST PAIN UNSPECIFIED 04/13/2010   ADENOCARCINOMA, PROSTATE  12/12/2009   HYPERCHOLESTEROLEMIA 12/12/2009   Obstructive sleep apnea 12/12/2009   Essential hypertension, benign 12/12/2009   Supraventricular tachycardia 12/12/2009    Past Surgical History:  Procedure Laterality Date   ABLATION OF DYSRHYTHMIC FOCUS  10/13/2014   SVT         by Dr Jacquelyne Balint FISSURE REPAIR     CARDIOVERSION N/A 07/01/2020   Procedure: CARDIOVERSION;  Surgeon: Adrian Prows, MD;  Location: [facility];  Service: Cardiovascular;  Laterality: N/A;   ELECTROPHYSIOLOGY STUDY N/A 10/13/2014   Procedure: ELECTROPHYSIOLOGY STUDY;  Surgeon: Evans Lance, MD;  Location: [facility] CATH LAB;  Service: Cardiovascular;  Laterality: N/A;   EYE SURGERY Bilateral 2011   cataract    PROSTATECTOMY     SUPRAVENTRICULAR TACHYCARDIA ABLATION N/A 10/13/2014   Procedure: SUPRAVENTRICULAR TACHYCARDIA ABLATION;  Surgeon: Evans Lance, MD;  Location: [facility] CATH LAB;  Service: Cardiovascular;  Laterality: N/A;   TRANSCAROTID ARTERY REVASCULARIZATION (TCAR) Left 08/01/2020   TRANSCAROTID ARTERY REVASCULARIZATION  Left 08/01/2020   Procedure: LEFT TRANSCAROTID ARTERY REVASCULARIZATION;  Surgeon: Elam Dutch, MD;  Location: [facility] OR;  Service: Vascular;  Laterality: Left;   ULTRASOUND GUIDANCE FOR VASCULAR ACCESS Right 08/01/2020   Procedure: ULTRASOUND GUIDANCE FOR VASCULAR ACCESS;  Surgeon: Elam Dutch, MD;  Location: [facility] OR;  Service: Vascular;  Laterality: Right;       Family History  Problem Relation Age of Onset   Hypertension Mother    Heart disease Father    Hypertension  Sister    Hypertension Brother    Ulcers Sister    Hypertension Sister    Diabetes Neg Hx    Coronary artery disease Neg Hx     Social History   Tobacco Use   Smoking status: Never   Smokeless tobacco: Never  Vaping Use   Vaping Use: Never used  Substance Use Topics   Alcohol use: No   Drug use: No    Home Medications Prior to Admission medications   Medication Sig Start Date End Date Taking?  Authorizing Provider  acebutolol (SECTRAL) 200 MG capsule Take 1 capsule (200 mg total) by mouth 2 (two) times daily. 11/12/20   Adrian Prows, MD  apixaban (ELIQUIS) 5 MG TABS tablet Take 1 tablet (5 mg total) by mouth 2 (two) times daily. 09/26/20   Adrian Prows, MD  aspirin EC 81 MG tablet Take 1 tablet (81 mg total) by mouth daily. 07/14/20   Elam Dutch, MD  b complex vitamins tablet Take 1 tablet by mouth daily.     [provider]  Cholecalciferol (VITAMIN D) 125 MCG (5000 UT) CAPS Take 5,000 Units by mouth daily.     [provider]  Coenzyme Q10 (CO Q-10) 200 MG CAPS Take 200 mg by mouth daily.     [provider]  Cyanocobalamin (B-12) 5000 MCG CAPS Take 5,000 mcg by mouth daily.    [provider]  diclofenac Sodium (VOLTAREN) 1 % GEL Apply 2 g topically 4 (four) times daily. 03/14/21   Adrian Prows, MD  Ferrous Sulfate 27 MG TABS Take 27 mg by mouth daily.    [provider]  folic acid (FOLVITE) 709 MCG tablet Take 800 mcg by mouth daily.    [provider]  hydrALAZINE (APRESOLINE) 50 MG tablet Take 1 tablet (50 mg total) by mouth in the morning, at noon, in the evening, and at bedtime. Take extra one if BP >140/70 10/31/20   Cantwell, Celeste C, PA-C  L-Arginine 1000 MG TABS Take 1,000 mg by mouth daily.     [provider]  LUTEIN PO Take 1 tablet by mouth daily.     [provider]  Magnesium 250 MG TABS Take 500 mg by mouth daily.     [provider]  melatonin 5 MG TABS Take 1 tablet by mouth daily as needed.    [provider]  PAPAYA ENZYME PO Take 1 tablet by mouth daily.    [provider]  Probiotic Product (ALIGN PO) Take 1 capsule by mouth daily.     [provider]  rosuvastatin (CRESTOR) 10 MG tablet Take 10 mg by mouth daily.      [provider]  valsartan (DIOVAN) 160 MG tablet Take 1 tablet (160 mg total) by mouth daily. 10/15/20   Adrian Prows, MD  Vitamin  A 2400 MCG (8000 UT) CAPS Take 8,000 Units by mouth daily.    [provider]  vitamin C (ASCORBIC ACID) 500 MG tablet Take 500 mg by mouth daily.    [provider]  zinc gluconate 50 MG tablet Take 50 mg by mouth daily.    [provider]    Allergies    Amoxicillin, Ibuprofen, Penicillins, Ace inhibitors, Amlodipine, Spironolactone, Atorvastatin, Azithromycin, Diltiazem, and Hydrochlorothiazide  Review of Systems   Review of Systems  Neurological:  Positive for dizziness.  All other systems reviewed and are negative.  Physical Exam Updated Vital Signs BP (!) 178/92 (BP Location: Right Arm)  Pulse 62   Temp 97.8 F (36.6 C) (Oral)   Resp (!) 22   Ht 5\' 7"  (1.702 m)   Wt 71.7 kg   SpO2 98%   BMI 24.75 kg/m   Physical Exam Vitals and nursing note reviewed.  87 year old male, resting comfortably and in no acute distress. Vital signs are significant for elevated blood pressure and slightly elevated respiratory rate. Oxygen saturation is 98%, which is normal. Head is normocephalic and atraumatic. PERRLA, EOMI. Oropharynx is clear. Neck is nontender and supple without adenopathy or JVD. Back is nontender and there is no CVA tenderness. Lungs are clear without rales, wheezes, or rhonchi. Chest is nontender. Heart has regular rate and rhythm without murmur. Abdomen is soft, flat, nontender without masses or hepatosplenomegaly and peristalsis is normoactive. Extremities have trace edema, full range of motion is present. Skin is warm and dry without rash. Neurologic: Mental status is normal, cranial nerves are intact, there are no motor or sensory deficits.  ED Results / Procedures / Treatments   Labs (all labs ordered are listed, but only abnormal results are displayed) Labs Reviewed  BASIC METABOLIC PANEL - Abnormal; Notable for the following components:      Result Value   Glucose, Bld 210 (*)    All other components within normal limits  CBC  WITH DIFFERENTIAL/PLATELET - Abnormal; Notable for the following components:   WBC 10.8 (*)    Neutro Abs 8.4 (*)    All other components within normal limits  LACTIC ACID, PLASMA  TROPONIN I (HIGH SENSITIVITY)  TROPONIN I (HIGH SENSITIVITY)    EKG EKG Interpretation  Date/Time:  Tuesday March 14 2021 23:58:08 EDT Ventricular Rate:  57 PR Interval:  304 QRS Duration: 95 QT Interval:  476 QTC Calculation: 464 R Axis:   -65 Text Interpretation: Sinus rhythm Prolonged PR interval Probable left atrial enlargement Left anterior fascicular block Left ventricular hypertrophy Anterior Q waves, possibly due to LVH When compared with ECG of 07/01/2020, Sinus rhythm has replaced Atrial fibrillation Confirmed by Delora Fuel ([id number]) on 03/15/2021 12:03:30 AM  Radiology No results found.  Procedures Procedures   Medications Ordered in ED Medications  ondansetron (ZOFRAN) injection 4 mg (4 mg Intravenous Given 03/15/21 0126)  lactated ringers bolus 1,000 mL (0 mLs Intravenous Stopped 03/15/21 0408)    ED Course  I have reviewed the triage vital signs and the nursing notes.  Pertinent labs & imaging results that were available during my care of the patient were reviewed by me and considered in my medical decision making (see chart for details).    MDM Rules/Calculators/A&P                         Syncopal episode of uncertain cause.  Patient does have history of atrial fibrillation, anticoagulated on apixaban.  He also has history of coronary disease.  Episode does not seem to be a vagal episode.  ECG shows no concerning changes.  Will check screening labs and troponin.  Old records are reviewed confirming cardiology visit earlier today at which time hydralazine dose was increased to 50 mg 4 times daily.  Labs are reassuring including normal troponin.  Orthostatic vital signs did not show any hypotension, but there was a significant drop in systolic blood pressure.  We will give IV fluids.   Delta troponin is pending.  Repeat troponin is normal.  Blood pressure has come down with observation.  He is felt to be safe  for discharge.  He is instructed to follow-up with his primary care provider in the next several days.  Return precautions discussed.  Final Clinical Impression(s) / ED Diagnoses Final diagnoses:  Syncope, unspecified syncope type  Chronic anticoagulation    Rx / DC Orders ED Discharge Orders     None        Delora Fuel, MD 64/38/37 [phone]

## 2021-03-15 NOTE — ED Notes (Signed)
Wife Dempsy Damiano left and went home would like call for update at [phone]

## 2021-03-15 NOTE — ED Triage Notes (Signed)
Brought by [facility] from home. At table had dinner took night meds for the night and sudden onset of dizziness, diaphoresis, unconscious for approximately 45 sec slumped over wife caught him so he did not fall. On eliquis. Went to the couch and had episodes of N/V. Seen cardio this am.

## 2021-03-15 NOTE — ED Notes (Signed)
Provider at bedside

## 2021-03-15 NOTE — ED Notes (Signed)
Called wife to come pick him up

## 2021-03-16 DIAGNOSIS — M25562 Pain in left knee: Secondary | ICD-10-CM | POA: Diagnosis not present

## 2021-03-16 DIAGNOSIS — I10 Essential (primary) hypertension: Secondary | ICD-10-CM | POA: Diagnosis not present

## 2021-03-16 DIAGNOSIS — M25462 Effusion, left knee: Secondary | ICD-10-CM | POA: Diagnosis not present

## 2021-03-21 ENCOUNTER — Ambulatory Visit: Payer: Medicare Other

## 2021-03-21 ENCOUNTER — Other Ambulatory Visit: Payer: Self-pay

## 2021-03-21 DIAGNOSIS — Z95828 Presence of other vascular implants and grafts: Secondary | ICD-10-CM

## 2021-03-21 DIAGNOSIS — M25462 Effusion, left knee: Secondary | ICD-10-CM | POA: Diagnosis not present

## 2021-03-21 DIAGNOSIS — R0989 Other specified symptoms and signs involving the circulatory and respiratory systems: Secondary | ICD-10-CM

## 2021-03-21 DIAGNOSIS — I6523 Occlusion and stenosis of bilateral carotid arteries: Secondary | ICD-10-CM | POA: Diagnosis not present

## 2021-03-21 DIAGNOSIS — M25562 Pain in left knee: Secondary | ICD-10-CM | POA: Diagnosis not present

## 2021-03-21 DIAGNOSIS — I10 Essential (primary) hypertension: Secondary | ICD-10-CM | POA: Diagnosis not present

## 2021-03-23 ENCOUNTER — Telehealth: Payer: Self-pay | Admitting: Cardiology

## 2021-03-23 DIAGNOSIS — M25462 Effusion, left knee: Secondary | ICD-10-CM | POA: Diagnosis not present

## 2021-03-23 DIAGNOSIS — I6523 Occlusion and stenosis of bilateral carotid arteries: Secondary | ICD-10-CM

## 2021-03-23 DIAGNOSIS — M25562 Pain in left knee: Secondary | ICD-10-CM | POA: Diagnosis not present

## 2021-03-23 DIAGNOSIS — M1712 Unilateral primary osteoarthritis, left knee: Secondary | ICD-10-CM | POA: Diagnosis not present

## 2021-03-23 DIAGNOSIS — Z95828 Presence of other vascular implants and grafts: Secondary | ICD-10-CM

## 2021-03-23 NOTE — Progress Notes (Signed)
Patient is aware of the results.  Carotid artery duplex 03/21/2021: Duplex suggests stenosis in the right internal carotid artery (16-49%). Duplex suggests stenosis in the right external carotid artery (<50%). Duplex suggests stenosis in the left internal carotid artery (50-69%). Duplex suggests stenosis in the left external carotid artery (>50%). Antegrade right vertebral artery flow. Antegrade left vertebral artery flow. Compared to the study done on 06/03/2020, left ICA stenosis of >70% is now 50 to 69%.  Patient has H/O TCAR- left carotid stent. Otherwise no significant change. Follow up in six months is appropriate if clinically indicated.

## 2021-03-23 NOTE — Telephone Encounter (Signed)
Patient called me regarding pain in his left knee joint, has been evaluated by orthopedics and sports medicine, saw Dr. Mayer Camel today on 03/23/2021, was advised to avoid anticoagulants and antiplatelet therapy for now with risk of hemarthrosis and arthrodesis.  Advised the patient that he should hold off on aspirin and Eliquis for now, at least restart aspirin at the earliest, he has an appointment to see Dr. Mayer Camel again next week, he will discuss regarding this.  I reviewed the results of the carotid artery duplex, left TCAR appears patent but elevated velocity, will recheck in 6 months.  All questions answered.  Carotid artery duplex 03/21/2021: Duplex suggests stenosis in the right internal carotid artery (16-49%). Duplex suggests stenosis in the right external carotid artery (<50%). Duplex suggests stenosis in the left internal carotid artery (50-69%). Duplex suggests stenosis in the left external carotid artery (>50%). Antegrade right vertebral artery flow. Antegrade left vertebral artery flow. Compared to the study done on 06/03/2020, left ICA stenosis of >70% is now 50 to 69%.  Patient has H/O TCAR- left carotid stent. Otherwise no significant change. Follow up in six months is appropriate if clinically indicated.    ICD-10-CM   1. Presence of internal carotid stent  Z95.828 PCV CAROTID DUPLEX (BILATERAL)    2. Asymptomatic bilateral carotid artery stenosis  I65.23 PCV CAROTID DUPLEX (BILATERAL)        Adrian Prows, MD, [facility] 03/23/2021, 9:01 PM Office: [phone] Fax: [phone] Pager: [phone]

## 2021-03-24 DIAGNOSIS — I10 Essential (primary) hypertension: Secondary | ICD-10-CM | POA: Diagnosis not present

## 2021-03-24 DIAGNOSIS — Z9889 Other specified postprocedural states: Secondary | ICD-10-CM | POA: Diagnosis not present

## 2021-03-24 DIAGNOSIS — R55 Syncope and collapse: Secondary | ICD-10-CM | POA: Diagnosis not present

## 2021-03-28 ENCOUNTER — Telehealth: Payer: Self-pay | Admitting: Cardiology

## 2021-03-28 NOTE — Telephone Encounter (Signed)
He had syncope due to pain and hence not needed, but if he has recurrence, then I will

## 2021-03-30 DIAGNOSIS — M1712 Unilateral primary osteoarthritis, left knee: Secondary | ICD-10-CM | POA: Diagnosis not present

## 2021-03-30 DIAGNOSIS — M25662 Stiffness of left knee, not elsewhere classified: Secondary | ICD-10-CM | POA: Diagnosis not present

## 2021-04-03 DIAGNOSIS — M25662 Stiffness of left knee, not elsewhere classified: Secondary | ICD-10-CM | POA: Diagnosis not present

## 2021-04-03 DIAGNOSIS — M1712 Unilateral primary osteoarthritis, left knee: Secondary | ICD-10-CM | POA: Diagnosis not present

## 2021-04-06 DIAGNOSIS — M1712 Unilateral primary osteoarthritis, left knee: Secondary | ICD-10-CM | POA: Diagnosis not present

## 2021-04-06 DIAGNOSIS — M25662 Stiffness of left knee, not elsewhere classified: Secondary | ICD-10-CM | POA: Diagnosis not present

## 2021-04-10 DIAGNOSIS — M1712 Unilateral primary osteoarthritis, left knee: Secondary | ICD-10-CM | POA: Diagnosis not present

## 2021-04-10 DIAGNOSIS — M25662 Stiffness of left knee, not elsewhere classified: Secondary | ICD-10-CM | POA: Diagnosis not present

## 2021-04-11 DIAGNOSIS — K219 Gastro-esophageal reflux disease without esophagitis: Secondary | ICD-10-CM | POA: Diagnosis not present

## 2021-04-11 DIAGNOSIS — E7800 Pure hypercholesterolemia, unspecified: Secondary | ICD-10-CM | POA: Diagnosis not present

## 2021-04-11 DIAGNOSIS — I10 Essential (primary) hypertension: Secondary | ICD-10-CM | POA: Diagnosis not present

## 2021-04-13 DIAGNOSIS — M25662 Stiffness of left knee, not elsewhere classified: Secondary | ICD-10-CM | POA: Diagnosis not present

## 2021-04-13 DIAGNOSIS — M1712 Unilateral primary osteoarthritis, left knee: Secondary | ICD-10-CM | POA: Diagnosis not present

## 2021-04-13 DIAGNOSIS — M25462 Effusion, left knee: Secondary | ICD-10-CM | POA: Diagnosis not present

## 2021-04-14 DIAGNOSIS — D649 Anemia, unspecified: Secondary | ICD-10-CM | POA: Diagnosis not present

## 2021-04-18 DIAGNOSIS — M25662 Stiffness of left knee, not elsewhere classified: Secondary | ICD-10-CM | POA: Diagnosis not present

## 2021-04-18 DIAGNOSIS — M1712 Unilateral primary osteoarthritis, left knee: Secondary | ICD-10-CM | POA: Diagnosis not present

## 2021-04-21 DIAGNOSIS — M1712 Unilateral primary osteoarthritis, left knee: Secondary | ICD-10-CM | POA: Diagnosis not present

## 2021-04-21 DIAGNOSIS — M25662 Stiffness of left knee, not elsewhere classified: Secondary | ICD-10-CM | POA: Diagnosis not present

## 2021-04-24 DIAGNOSIS — E7800 Pure hypercholesterolemia, unspecified: Secondary | ICD-10-CM | POA: Diagnosis not present

## 2021-04-24 DIAGNOSIS — K219 Gastro-esophageal reflux disease without esophagitis: Secondary | ICD-10-CM | POA: Diagnosis not present

## 2021-04-24 DIAGNOSIS — I10 Essential (primary) hypertension: Secondary | ICD-10-CM | POA: Diagnosis not present

## 2021-04-25 DIAGNOSIS — M25662 Stiffness of left knee, not elsewhere classified: Secondary | ICD-10-CM | POA: Diagnosis not present

## 2021-04-25 DIAGNOSIS — M1712 Unilateral primary osteoarthritis, left knee: Secondary | ICD-10-CM | POA: Diagnosis not present

## 2021-04-27 DIAGNOSIS — M25662 Stiffness of left knee, not elsewhere classified: Secondary | ICD-10-CM | POA: Diagnosis not present

## 2021-04-27 DIAGNOSIS — M1712 Unilateral primary osteoarthritis, left knee: Secondary | ICD-10-CM | POA: Diagnosis not present

## 2021-05-01 DIAGNOSIS — M25562 Pain in left knee: Secondary | ICD-10-CM | POA: Diagnosis not present

## 2021-05-01 DIAGNOSIS — M1712 Unilateral primary osteoarthritis, left knee: Secondary | ICD-10-CM | POA: Insufficient documentation

## 2021-05-01 DIAGNOSIS — G8929 Other chronic pain: Secondary | ICD-10-CM | POA: Diagnosis not present

## 2021-05-02 DIAGNOSIS — M25662 Stiffness of left knee, not elsewhere classified: Secondary | ICD-10-CM | POA: Diagnosis not present

## 2021-05-02 DIAGNOSIS — M1712 Unilateral primary osteoarthritis, left knee: Secondary | ICD-10-CM | POA: Diagnosis not present

## 2021-05-04 DIAGNOSIS — M1712 Unilateral primary osteoarthritis, left knee: Secondary | ICD-10-CM | POA: Diagnosis not present

## 2021-05-04 DIAGNOSIS — M25462 Effusion, left knee: Secondary | ICD-10-CM | POA: Diagnosis not present

## 2021-05-05 DIAGNOSIS — M1712 Unilateral primary osteoarthritis, left knee: Secondary | ICD-10-CM | POA: Diagnosis not present

## 2021-05-05 DIAGNOSIS — M25662 Stiffness of left knee, not elsewhere classified: Secondary | ICD-10-CM | POA: Diagnosis not present

## 2021-05-09 DIAGNOSIS — M1712 Unilateral primary osteoarthritis, left knee: Secondary | ICD-10-CM | POA: Diagnosis not present

## 2021-05-09 DIAGNOSIS — M25662 Stiffness of left knee, not elsewhere classified: Secondary | ICD-10-CM | POA: Diagnosis not present

## 2021-05-12 DIAGNOSIS — M25662 Stiffness of left knee, not elsewhere classified: Secondary | ICD-10-CM | POA: Diagnosis not present

## 2021-05-12 DIAGNOSIS — M1712 Unilateral primary osteoarthritis, left knee: Secondary | ICD-10-CM | POA: Diagnosis not present

## 2021-05-17 DIAGNOSIS — M1712 Unilateral primary osteoarthritis, left knee: Secondary | ICD-10-CM | POA: Diagnosis not present

## 2021-05-17 DIAGNOSIS — M25662 Stiffness of left knee, not elsewhere classified: Secondary | ICD-10-CM | POA: Diagnosis not present

## 2021-05-22 DIAGNOSIS — I10 Essential (primary) hypertension: Secondary | ICD-10-CM | POA: Diagnosis not present

## 2021-05-24 ENCOUNTER — Ambulatory Visit: Payer: Medicare Other | Admitting: Physician Assistant

## 2021-05-24 ENCOUNTER — Other Ambulatory Visit: Payer: Self-pay

## 2021-05-24 ENCOUNTER — Ambulatory Visit (HOSPITAL_COMMUNITY)
Admission: RE | Admit: 2021-05-24 | Discharge: 2021-05-24 | Disposition: A | Discharge status: Home / Self Care | Payer: Medicare Other | Source: Ambulatory Visit | Attending: Vascular Surgery | Admitting: Vascular Surgery

## 2021-05-24 VITALS — BP 146/69 | HR 60 | Temp 97.8°F | Resp 20 | Ht 67.0 in | Wt 157.2 lb

## 2021-05-24 DIAGNOSIS — I6522 Occlusion and stenosis of left carotid artery: Secondary | ICD-10-CM

## 2021-05-24 NOTE — Progress Notes (Signed)
History of Present Illness:  Patient is a 87 y.o. year old male who presents for evaluation of carotid stenosis.  S/P left TCAR.  The patient denies symptoms of TIA, amaurosis, or stroke.  The patient is currently on ASA and Eliquis 5 MG antiplatelet therapy.  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  He is followed by Dr. Irene Limbo in Hematology.    He was referred to [place] by Dr. Einar Gip  for asymptomatic > 80% left ICA stenosis.     Past Medical History:  Diagnosis Date   Coronary artery disease    60-70% circumflex (cath in [place] 08/2010).   Dysrhythmia    A history of A flutter and tachycardia (SVT)   GERD (gastroesophageal reflux disease)    Hypercholesterolemia    Hypertension    Prostate cancer ([place])    status post prostatectomy   Sleep apnea    Stenosis of subclavian artery ([place]) 06/30/2020    Severe stenosis of the proximal right subclavian artery    SVT (supraventricular tachycardia) ([place])    a. Holter monitoring 2011 - AV node reentry, multifocal atrial tachycardia, and resting bradycardia. b. previously tx with Abana in [place].    Past Surgical History:  Procedure Laterality Date   ABLATION OF DYSRHYTHMIC FOCUS  10/13/2014   SVT         by Dr Jacquelyne Balint FISSURE REPAIR     CARDIOVERSION N/A 07/01/2020   Procedure: CARDIOVERSION;  Surgeon: Adrian Prows, MD;  Location: [facility];  Service: Cardiovascular;  Laterality: N/A;   ELECTROPHYSIOLOGY STUDY N/A 10/13/2014   Procedure: ELECTROPHYSIOLOGY STUDY;  Surgeon: Evans Lance, MD;  Location: [facility] CATH LAB;  Service: Cardiovascular;  Laterality: N/A;   EYE SURGERY Bilateral 2011   cataract    PROSTATECTOMY     SUPRAVENTRICULAR TACHYCARDIA ABLATION N/A 10/13/2014   Procedure: SUPRAVENTRICULAR TACHYCARDIA ABLATION;  Surgeon: Evans Lance, MD;  Location: [facility] CATH LAB;  Service: Cardiovascular;  Laterality: N/A;   TRANSCAROTID ARTERY REVASCULARIZATION (TCAR) Left  08/01/2020   TRANSCAROTID ARTERY REVASCULARIZATION  Left 08/01/2020   Procedure: LEFT TRANSCAROTID ARTERY REVASCULARIZATION;  Surgeon: Elam Dutch, MD;  Location: [facility] OR;  Service: Vascular;  Laterality: Left;   ULTRASOUND GUIDANCE FOR VASCULAR ACCESS Right 08/01/2020   Procedure: ULTRASOUND GUIDANCE FOR VASCULAR ACCESS;  Surgeon: Elam Dutch, MD;  Location: [facility] OR;  Service: Vascular;  Laterality: Right;     Social History Social History   Tobacco Use   Smoking status: Never   Smokeless tobacco: Never  Vaping Use   Vaping Use: Never used  Substance Use Topics   Alcohol use: No   Drug use: No    Family History Family History  Problem Relation Age of Onset   Hypertension Mother    Heart disease Father    Hypertension Sister    Hypertension Brother    Ulcers Sister    Hypertension Sister    Diabetes Neg Hx    Coronary artery disease Neg Hx     Allergies  Allergies  Allergen Reactions   Amoxicillin Diarrhea    GI upset: Bleeding diarrhea  Other reaction(s): GI Bleed   Ibuprofen Diarrhea    Bloody diarrhea    Penicillins Diarrhea    Bloody diarrhea    Ace Inhibitors Swelling    Face swelling    Amlodipine Swelling   Spironolactone     Hyponatremia    Atorvastatin  Weakness    Azithromycin     Stomach ache    Diltiazem Nausea Only    Upset stomach per patient   Hydrochlorothiazide Other (See Comments)    Photosensitivity     Current Outpatient Medications  Medication Sig Dispense Refill   acebutolol (SECTRAL) 200 MG capsule Take 1 capsule (200 mg total) by mouth 2 (two) times daily. 180 capsule 3   apixaban (ELIQUIS) 5 MG TABS tablet Take 1 tablet (5 mg total) by mouth 2 (two) times daily. 180 tablet 2   aspirin EC 81 MG tablet Take 1 tablet (81 mg total) by mouth daily. 70 tablet 1   b complex vitamins tablet Take 1 tablet by mouth daily.      Cholecalciferol (VITAMIN D) 125 MCG (5000 UT) CAPS Take 5,000 Units by mouth daily.       Coenzyme Q10 (CO Q-10) 200 MG CAPS Take 200 mg by mouth daily.      Cyanocobalamin (B-12) 5000 MCG CAPS Take 5,000 mcg by mouth daily.     Ferrous Sulfate 27 MG TABS Take 27 mg by mouth daily.     folic acid (FOLVITE) Q000111Q MCG tablet Take 800 mcg by mouth daily.     hydrALAZINE (APRESOLINE) 50 MG tablet Take 1 tablet (50 mg total) by mouth in the morning, at noon, in the evening, and at bedtime. Take extra one if BP >140/70 270 tablet 3   L-Arginine 1000 MG TABS Take 1,000 mg by mouth daily.      LUTEIN PO Take 1 tablet by mouth daily.      Magnesium 250 MG TABS Take 500 mg by mouth daily.      melatonin 5 MG TABS Take 1 tablet by mouth daily as needed.     PAPAYA ENZYME PO Take 1 tablet by mouth daily.     Probiotic Product (ALIGN PO) Take 1 capsule by mouth daily.      rosuvastatin (CRESTOR) 10 MG tablet Take 10 mg by mouth daily.       valsartan (DIOVAN) 160 MG tablet Take 1 tablet (160 mg total) by mouth daily. 90 tablet 3   Vitamin A 2400 MCG (8000 UT) CAPS Take 8,000 Units by mouth daily.     vitamin C (ASCORBIC ACID) 500 MG tablet Take 500 mg by mouth daily.     zinc gluconate 50 MG tablet Take 50 mg by mouth daily.     No current facility-administered medications for this visit.    ROS:   General:  No weight loss, Fever, chills  HEENT: No recent headaches, no nasal bleeding, no visual changes, no sore throat  Neurologic: No dizziness, blackouts, seizures. No recent symptoms of stroke or mini- stroke. No recent episodes of slurred speech, or temporary blindness.  Cardiac: No recent episodes of chest pain/pressure, no shortness of breath at rest.  No shortness of breath with exertion.  Denies history of atrial fibrillation or irregular heartbeat  Vascular: No history of rest pain in feet.  No history of claudication.  No history of non-healing ulcer, positive history of DVT   Pulmonary: No home oxygen, no productive cough, no hemoptysis,  No asthma or wheezing  Musculoskeletal:   '[ ]'$  Arthritis, '[ ]'$  Low back pain,  [ x] Joint pain  Hematologic:No history of hypercoagulable state.  + history of easy bleeding.  No history of anemia  Gastrointestinal: No hematochezia or melena,  No gastroesophageal reflux, no trouble swallowing  Urinary: '[ ]'$  chronic Kidney disease, '[ ]'$   on HD - '[ ]'$  MWF or '[ ]'$  TTHS, '[ ]'$  Burning with urination, '[ ]'$  Frequent urination, '[ ]'$  Difficulty urinating;   Skin: No rashes  Psychological: No history of anxiety,  No history of depression   Physical Examination  Vitals:   05/24/21 1444 05/24/21 1446  BP: (!) 161/78 (!) 146/69  Pulse: 60   Resp: 20   Temp: 97.8 F (36.6 C)   TempSrc: Temporal   SpO2: 99%   Weight: 157 lb 3.2 oz (71.3 kg)   Height: '5\' 7"'$  (1.702 m)     Body mass index is 24.62 kg/m.  General:  Alert and oriented, no acute distress HEENT: Normal Neck: No bruit or JVD Pulmonary: Clear to auscultation bilaterally Cardiac: Regular Rate and Rhythm without murmur Gastrointestinal: Soft, non-tender, non-distended, no mass, no scars Skin: No rash Extremity Pulses:  2+ radial Musculoskeletal: No deformity or edema  Neurologic: Upper and lower extremity motor 5/5 and symmetric  DATA:  Left Carotid Findings:  +----------+--------+--------+--------+------------------+--------+            PSV cm/sEDV cm/sStenosisPlaque DescriptionComments  +----------+--------+--------+--------+------------------+--------+  CCA Prox  65      13                                          +----------+--------+--------+--------+------------------+--------+  CCA Mid   86      16                                          +----------+--------+--------+--------+------------------+--------+  CCA Distal87      18                                stent     +----------+--------+--------+--------+------------------+--------+  ICA Prox  125     31      Normal                    stent      +----------+--------+--------+--------+------------------+--------+  ICA Mid   118     28                                          +----------+--------+--------+--------+------------------+--------+  ICA Distal127     23                                          +----------+--------+--------+--------+------------------+--------+  ECA       331     14      >50%                                +----------+--------+--------+--------+------------------+--------+   +----------+--------+--------+----------------+-------------------+            PSV cm/sEDV cm/sDescribe        Arm Pressure (mmHG)  +----------+--------+--------+----------------+-------------------+  [id number]     0       Multiphasic, WNL                     +----------+--------+--------+----------------+-------------------+   +---------+--------+--+--------+--+---------+  VertebralPSV cm/s65EDV cm/s11Antegrade  +---------+--------+--+--------+--+---------+       Left Stent(s):  +------------------------+--------+--------+--------+--------+--------+  Distal CCA/ Proximal ICAPSV cm/sEDV cm/sStenosisWaveformComments  +------------------------+--------+--------+--------+--------+--------+  Prox to Stent           82      15                                +------------------------+--------+--------+--------+--------+--------+  Proximal Stent          80      16                                +------------------------+--------+--------+--------+--------+--------+  Mid Stent               124     27                                +------------------------+--------+--------+--------+--------+--------+  Distal Stent            143     31                                +------------------------+--------+--------+--------+--------+--------+  Distal to Stent         112     24                                 +------------------------+--------+--------+--------+--------+--------+     Summary:  Right Carotid: Velocities in the right ICA are consistent with a 1-39%  stenosis.                 Non-hemodynamically significant plaque <50% noted in the  CCA.   Left Carotid: There is no evidence of stenosis in the left ICA.                Non-hemodynamically significant plaque <50% noted in the  CCA. The                ECA appears >50% stenosed. Patent left CCA/ ICA stent.   Vertebrals:  Bilateral vertebral arteries demonstrate antegrade flow.  Subclavians: Right subclavian artery was stenotic. Normal flow  hemodynamics were               seen in the left subclavian   ASSESSMENT:  S/P left TCAR by Dr. Oneida Alar The carotid duplex shows no stenosis in the stent and the right ICA stenosis is < 39%. He remains asymptomatic of stroke/TIA   PLAN: He would prefer for VVS to contniue following his carotid stenosis.  I have scheduled his f/u carotid duplex in 1 year.  We reviewed his studies today.  If he develops symptoms of stroke/TIA he will call 911.    He has a recent history of left knee pain.  He was seen by Dr. Mayer Camel and advised to avoid anticoagulants and antiplatelet therapy for now with risk of hemarthrosis and arthrodesis. He has returned to ASA and Eliquis '5mg'$  QD at this time.  I have asked him to let Dr. Irven Shelling office know his status.     Roxy Horseman PA-C [facility] Office: [phone]  MD in clinic [place]

## 2021-06-06 DIAGNOSIS — M25662 Stiffness of left knee, not elsewhere classified: Secondary | ICD-10-CM | POA: Diagnosis not present

## 2021-06-06 DIAGNOSIS — M1712 Unilateral primary osteoarthritis, left knee: Secondary | ICD-10-CM | POA: Diagnosis not present

## 2021-06-15 DIAGNOSIS — M1712 Unilateral primary osteoarthritis, left knee: Secondary | ICD-10-CM | POA: Diagnosis not present

## 2021-06-15 DIAGNOSIS — M25662 Stiffness of left knee, not elsewhere classified: Secondary | ICD-10-CM | POA: Diagnosis not present

## 2021-06-20 DIAGNOSIS — N476 Balanoposthitis: Secondary | ICD-10-CM | POA: Diagnosis not present

## 2021-06-21 DIAGNOSIS — M25662 Stiffness of left knee, not elsewhere classified: Secondary | ICD-10-CM | POA: Diagnosis not present

## 2021-06-21 DIAGNOSIS — M1712 Unilateral primary osteoarthritis, left knee: Secondary | ICD-10-CM | POA: Diagnosis not present

## 2021-06-26 ENCOUNTER — Other Ambulatory Visit: Payer: Self-pay | Admitting: Student

## 2021-06-26 DIAGNOSIS — I10 Essential (primary) hypertension: Secondary | ICD-10-CM

## 2021-06-27 DIAGNOSIS — M25662 Stiffness of left knee, not elsewhere classified: Secondary | ICD-10-CM | POA: Diagnosis not present

## 2021-06-27 DIAGNOSIS — M1712 Unilateral primary osteoarthritis, left knee: Secondary | ICD-10-CM | POA: Diagnosis not present

## 2021-07-13 DIAGNOSIS — M1712 Unilateral primary osteoarthritis, left knee: Secondary | ICD-10-CM | POA: Diagnosis not present

## 2021-07-13 DIAGNOSIS — M25662 Stiffness of left knee, not elsewhere classified: Secondary | ICD-10-CM | POA: Diagnosis not present

## 2021-08-07 ENCOUNTER — Telehealth: Payer: Self-pay | Admitting: Pharmacist

## 2021-08-07 NOTE — Telephone Encounter (Signed)
It should be fine 

## 2021-08-07 NOTE — Telephone Encounter (Signed)
Pt was initially scheduled for 63-month follow up OV on 09/13/21 with Dr. Einar Gip. Pt states that he is scheduled to leave for [place] on 08/15/21 and would like to be seen before his scheduled trip. Pt states that he would prefer to see Dr. Einar Gip, but his scheduled is booked per front staff. Pt open to seeing Cel[facility] if Dr. Einar Gip has no opening.   Of note, pt states that he had reduced his eliquis dose from 5 mg BID to 2.5 mg BID due to concerns of persistent knee swelling/bleeding concerns. His orthopedic provider recommended to decrease the dose and discuss it with his cardiologist. Pt reports that he has been taking 2.5 mg BID for the past few months and has noticed improvement in his knee swelling concerns.

## 2021-08-09 ENCOUNTER — Other Ambulatory Visit: Payer: Self-pay

## 2021-08-09 ENCOUNTER — Ambulatory Visit: Payer: Medicare Other | Admitting: Student

## 2021-08-09 ENCOUNTER — Encounter: Payer: Self-pay | Admitting: Student

## 2021-08-09 VITALS — BP 180/84 | HR 71 | Temp 98.5°F | Ht 67.0 in | Wt 158.0 lb

## 2021-08-09 DIAGNOSIS — I10 Essential (primary) hypertension: Secondary | ICD-10-CM

## 2021-08-09 DIAGNOSIS — I6522 Occlusion and stenosis of left carotid artery: Secondary | ICD-10-CM

## 2021-08-09 MED ORDER — ISOSORBIDE DINITRATE 30 MG PO TABS: 30.0000 mg | ORAL_TABLET | Freq: Three times a day (TID) | ORAL | 3 refills | Status: DC

## 2021-08-09 NOTE — Progress Notes (Signed)
Primary Physician/Referring:  Aretta Nip, MD  Patient ID: Dalton Cardenas, male    DOB: 02-10-1934, 87 y.o.   MRN: [id number]  Chief Complaint  Patient presents with   Hypertension   HPI:    Dalton Cardenas  is a 87 y.o. 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 Cristopher Peru and recommended either ablation or continued medical therapy.  Patient with recurrence of atypical atrial flutter 06/30/2020. Patient underwent left transcarotid revascularization (TCAR), stent left internal carotid artery by Dr. Ruta Hinds 08/01/2020.   Patient had originally been scheduled follow-up with Dr. Einar Gip in December, however patient is preparing to leave for an extended trip to [place] and requested we move up his 26-month follow-up appointment.  He presents today feeling relatively well overall.  Knee pain and swelling has resolved and he is without specific complaints.  He reports home blood pressure readings are uncontrolled averaging 782-956 mmHg systolic.  Patient is presently on Eliquis 2.5 mg instead of 5 mg as he is developed recurrent hemarthrosis, which has resolved since reducing Eliquis.  Patient also remains on aspirin 81 mg daily.  Discussed with patient regarding option of discontinuing aspirin and rechallenge and Eliquis 5 mg, however he is resistant to this.  Past Medical History:  Diagnosis Date   Coronary artery disease    60-70% circumflex (cath in [place] 08/2010).   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 Holter monitoring  2011 - AV node reentry, multifocal atrial tachycardia, and resting bradycardia. b. previously tx with [facility] in [place].   Past Surgical History:  Procedure Laterality Date   ABLATION OF DYSRHYTHMIC FOCUS  10/13/2014   SVT         by Dr Jacquelyne Balint FISSURE REPAIR     CARDIOVERSION N/A 07/01/2020   Procedure: CARDIOVERSION;  Surgeon: Adrian Prows, MD;  Location: [facility];  Service: Cardiovascular;  Laterality: N/A;   ELECTROPHYSIOLOGY STUDY N/A 10/13/2014   Procedure: ELECTROPHYSIOLOGY STUDY;  Surgeon: Evans Lance, MD;  Location: [facility] CATH LAB;  Service: Cardiovascular;  Laterality: N/A;   EYE SURGERY Bilateral 2011   cataract    PROSTATECTOMY     SUPRAVENTRICULAR TACHYCARDIA ABLATION N/A 10/13/2014   Procedure: SUPRAVENTRICULAR TACHYCARDIA ABLATION;  Surgeon: Evans Lance, MD;  Location: [facility] CATH LAB;  Service: Cardiovascular;  Laterality: N/A;   TRANSCAROTID ARTERY REVASCULARIZATION (TCAR) Left 08/01/2020   TRANSCAROTID ARTERY REVASCULARIZATION  Left 08/01/2020   Procedure: LEFT TRANSCAROTID ARTERY REVASCULARIZATION;  Surgeon: Elam Dutch, MD;  Location[facility] OR;  Service: Vascular;  Laterality: Left;   ULTRASOUND GUIDANCE FOR VASCULAR ACCESS Right 08/01/2020   Procedure: ULTRASOUND GUIDANCE FOR VASCULAR ACCESS;  Surgeon: Elam Dutch, MD;  Location: [facility] OR;  Service: Vascular;  Laterality: Right;   Family History  Problem Relation Age of Onset   Hypertension Mother    Heart disease Father    Hypertension Sister    Hypertension Brother    Ulcers Sister    Hypertension Sister  Diabetes Neg Hx    Coronary artery disease Neg Hx    Social History   Tobacco Use   Smoking status: Never   Smokeless tobacco: Never  Substance Use Topics   Alcohol use: No   ROS  Review of Systems  Constitutional: Negative for malaise/fatigue and weight gain.  Cardiovascular:  Positive for syncope (03/15/2021). Negative for chest pain, claudication, leg swelling, near-syncope, orthopnea,  palpitations and paroxysmal nocturnal dyspnea.  Respiratory:  Negative for shortness of breath.   Neurological:  Negative for dizziness.   Objective  Blood pressure (!) 180/84, pulse 71, temperature 98.5 F (36.9 C), temperature source Temporal, height 5\' 7"  (1.702 m), weight 158 lb (71.7 kg), SpO2 98 %.  Vitals with BMI 08/09/2021 08/09/2021 05/24/2021  Height - 5\' 7"  -  Weight - 158 lbs -  BMI - 80.88 -  Systolic 110 315 945  Diastolic 84 92 69  Pulse 71 75 -     Physical Exam Vitals reviewed.  HENT:     Head: Normocephalic and atraumatic.  Cardiovascular:     Rate and Rhythm: Normal rate and regular rhythm.     Pulses: Intact distal pulses.     Heart sounds: S1 normal and S2 normal. No murmur heard.   No gallop.     Comments:  Left carotid endarterectomy. Pulmonary:     Effort: Pulmonary effort is normal. No respiratory distress.     Breath sounds: No wheezing, rhonchi or rales.  Musculoskeletal:     Right lower leg: Edema (trace) present.     Left lower leg: Edema (trace) present.  Neurological:     Mental Status: He is alert.   Laboratory examination:   Recent Labs    11/08/20 1417 03/15/21 0023  NA 136 135  K 4.8 3.5  CL 104 102  CO2 24 24  GLUCOSE 102* 210*  BUN 20 17  CREATININE 1.04 1.03  CALCIUM 9.4 9.5  GFRNONAA >60 >60   CrCl cannot be calculated (Patient's most recent lab result is older than the maximum 21 days allowed.).  CMP Latest Ref Rng & Units 03/15/2021 11/08/2020 08/02/2020  Glucose 70 - 99 mg/dL 210(H) 102(H) 98  BUN 8 - 23 mg/dL 17 20 13   Creatinine 0.61 - 1.24 mg/dL 1.03 1.04 1.02  Sodium 135 - 145 mmol/L 135 136 132(L)  Potassium 3.5 - 5.1 mmol/L 3.5 4.8 4.3  Chloride 98 - 111 mmol/L 102 104 104  CO2 22 - 32 mmol/L 24 24 22   Calcium 8.9 - 10.3 mg/dL 9.5 9.4 7.7(L)  Total Protein 6.5 - 8.1 g/dL - 7.5 -  Total Bilirubin 0.3 - 1.2 mg/dL - 0.9 -  Alkaline Phos 38 - 126 U/L - 71 -  AST 15 - 41 U/L - 23 -  ALT 0 - 44 U/L - 21 -   CBC  Latest Ref Rng & Units 03/15/2021 11/08/2020 08/02/2020  WBC 4.0 - 10.5 K/uL 10.8(H) 8.2 9.0  Hemoglobin 13.0 - 17.0 g/dL 14.0 11.8(L) 10.2(L)  Hematocrit 39.0 - 52.0 % 41.0 36.1(L) 30.1(L)  Platelets 150 - 400 K/uL 243 267 220   Lipid Panel  No results found for: CHOL, TRIG, HDL, CHOLHDL, VLDL, LDLCALC, LDLDIRECT HEMOGLOBIN A1C No results found for: HGBA1C, MPG TSH No results for input(s): TSH in the last 8760 hours.   External labs 07/06/2019:   Cholesterol, total 189.000 01/25/2020 HDL 46.000 01/25/2020 LDL-C 123.000 01/25/2020 Triglycerides 108.000 01/25/2020  A1C 6.300 01/25/2020 TSH 4.640 01/25/2020   Hemoglobin 12.800 01/25/2020  Creatinine, Serum 1.150 01/25/2020 Potassium 4.100 01/25/2020 Magnesium N/D ALT (SGPT) 20.000 01/25/2020   Cholesterol, total 281.000 07/06/2019 HDL 42.000 07/06/2019 LDL 206.000 07/06/2019 Triglycerides 168.000 07/06/2019 A1C 5.800 07/06/2019 Hemoglobin 12.800 07/06/2019 Creatinine, Serum 1.140 07/06/2019 Potassium 4.200 07/06/2019 Magnesium N/D ALT (SGPT) 12.000 07/06/2019  Allergies   Allergies  Allergen Reactions   Amoxicillin Diarrhea    GI upset: Bleeding diarrhea  Other reaction(s): GI Bleed   Ibuprofen Diarrhea    Bloody diarrhea    Penicillins Diarrhea    Bloody diarrhea    Ace Inhibitors Swelling    Face swelling    Amlodipine Swelling   Amoxicillin-Pot Clavulanate Other (See Comments)   Doxycycline Hyclate Other (See Comments)   Spironolactone     Hyponatremia    Atorvastatin     Weakness    Azithromycin     Stomach ache    Diltiazem Nausea Only    Upset stomach per patient   Hydrochlorothiazide Other (See Comments)    Photosensitivity    Medications Prior to Visit:   Outpatient Medications Prior to Visit  Medication Sig Dispense Refill   acebutolol (SECTRAL) 200 MG capsule Take 1 capsule (200 mg total) by mouth 2 (two) times daily. 180 capsule 3   apixaban (ELIQUIS) 5 MG TABS tablet Take 1 tablet (5 mg  total) by mouth 2 (two) times daily. (Patient taking differently: Take 2.5 mg by mouth 2 (two) times daily.) 180 tablet 2   aspirin EC 81 MG tablet Take 1 tablet (81 mg total) by mouth daily. 70 tablet 1   b complex vitamins tablet Take 1 tablet by mouth daily.      Cholecalciferol (VITAMIN D) 125 MCG (5000 UT) CAPS Take 5,000 Units by mouth daily.      Coenzyme Q10 (CO Q-10) 200 MG CAPS Take 200 mg by mouth daily.      Cyanocobalamin (B-12) 5000 MCG CAPS Take 5,000 mcg by mouth daily.     Ferrous Sulfate 27 MG TABS Take 27 mg by mouth daily.     folic acid (FOLVITE) 010 MCG tablet Take 800 mcg by mouth daily.     hydrALAZINE (APRESOLINE) 50 MG tablet TAKE 1 TABLET BY MOUTH 4 TIMES DAILY. MAY TAKE EXTRA  TABLET  IF  BP  OVER  140/70. 120 tablet 0   L-Arginine 1000 MG TABS Take 1,000 mg by mouth daily.      LUTEIN PO Take 1 tablet by mouth daily.      Magnesium 250 MG TABS Take 500 mg by mouth daily.      melatonin 5 MG TABS Take 1 tablet by mouth daily as needed.     PAPAYA ENZYME PO Take 1 tablet by mouth daily.     Probiotic Product (ALIGN PO) Take 1 capsule by mouth daily.      rosuvastatin (CRESTOR) 10 MG tablet Take 10 mg by mouth daily.       valsartan (DIOVAN) 160 MG tablet Take 1 tablet (160 mg total) by mouth daily. 90 tablet 3   Vitamin A 2400 MCG (8000 UT) CAPS Take 8,000 Units by mouth daily.     vitamin C (ASCORBIC ACID) 500 MG tablet Take 500 mg by mouth daily.     zinc gluconate 50 MG tablet Take 50 mg by mouth daily.     No facility-administered medications prior to visit.   Final Medications at End of Visit    Current Meds  Medication Sig   acebutolol (SECTRAL) 200 MG capsule Take 1  capsule (200 mg total) by mouth 2 (two) times daily.   apixaban (ELIQUIS) 5 MG TABS tablet Take 1 tablet (5 mg total) by mouth 2 (two) times daily. (Patient taking differently: Take 2.5 mg by mouth 2 (two) times daily.)   aspirin EC 81 MG tablet Take 1 tablet (81 mg total) by mouth daily.    b complex vitamins tablet Take 1 tablet by mouth daily.    Cholecalciferol (VITAMIN D) 125 MCG (5000 UT) CAPS Take 5,000 Units by mouth daily.    Coenzyme Q10 (CO Q-10) 200 MG CAPS Take 200 mg by mouth daily.    Cyanocobalamin (B-12) 5000 MCG CAPS Take 5,000 mcg by mouth daily.   Ferrous Sulfate 27 MG TABS Take 27 mg by mouth daily.   folic acid (FOLVITE) 176 MCG tablet Take 800 mcg by mouth daily.   hydrALAZINE (APRESOLINE) 50 MG tablet TAKE 1 TABLET BY MOUTH 4 TIMES DAILY. MAY TAKE EXTRA  TABLET  IF  BP  OVER  140/70.   isosorbide dinitrate (ISORDIL) 30 MG tablet Take 1 tablet (30 mg total) by mouth 3 (three) times daily.   L-Arginine 1000 MG TABS Take 1,000 mg by mouth daily.    LUTEIN PO Take 1 tablet by mouth daily.    Magnesium 250 MG TABS Take 500 mg by mouth daily.    melatonin 5 MG TABS Take 1 tablet by mouth daily as needed.   PAPAYA ENZYME PO Take 1 tablet by mouth daily.   Probiotic Product (ALIGN PO) Take 1 capsule by mouth daily.    rosuvastatin (CRESTOR) 10 MG tablet Take 10 mg by mouth daily.     valsartan (DIOVAN) 160 MG tablet Take 1 tablet (160 mg total) by mouth daily.   Vitamin A 2400 MCG (8000 UT) CAPS Take 8,000 Units by mouth daily.   vitamin C (ASCORBIC ACID) 500 MG tablet Take 500 mg by mouth daily.   zinc gluconate 50 MG tablet Take 50 mg by mouth daily.    Radiology:  No results found.   Chest x-ray 06/30/2020: Lungs are clear.  No pleural effusion or pneumothorax. The heart is normal in size.  Thoracic aortic atherosclerosis. Degenerative changes of the visualized thoracolumbar spine.  CT angiogram chest 11/11/2020: 1. No demonstrable pulmonary embolus. No thoracic aortic aneurysm or dissection. There are foci of aortic atherosclerosis as well as foci of great vessel and coronary artery calcification.   2. Mild atelectatic change in the left lower lobe and inferior lingula. No edema or airspace opacity. Multiple 1-2 mm nodular opacities noted in each  upper lobe. No larger pulmonary nodular opacities evident. No follow-up needed if patient is low-risk (and has no known or suspected primary neoplasm). Non-contrast chest CT can be considered in 12 months if patient is high-risk.  Cardiac Studies:  Coronary Angiogram   [2011]: Performed in [place], 70% stenosis in the circumflex coronary artery. Was performed as a part of workup for EP study for the SVT.  Nuclear stress test   [04/26/2010]: Myocardial perfusion scan 04/26/2010: Normal perfusion, EF 77%.  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  Echocardiogram 06/03/2020: Left ventricle cavity is normal in size. Moderate asymmetric hypertrophy of the left ventricle. Basal septum measures at 2.0 cm. Mild LVOT peak gradient 5 mmHg without SAM or LVOT obstruction. Normal wall motion. LVEF 55-60%. Indeterminate diastolic dysfunction. Left atrial cavity  is mildly dilated. Trileaflet aortic valve. Trace aortic stenosis. Moderate (Grade II) aortic regurgitation. Mild calcification, No significant stenosis. Moderate (Grade II) mitral regurgitation. Mild tricuspid regurgitation. Estimated pulmonary artery systolic pressure 30 mmHg.  No significant change compared to previous study in 2018.  CT angio neck 06/28/2020: 1.  Noncalcified atherosclerotic disease in the bifurcation of the right ICA <50% stenosis. 2. 80% stenosis of the proximal left internal carotid artery secondary to noncalcified plaque. 3. 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 4. Aortic Atherosclerosis (ICD10-I70.0).  CT angio head W or WO Contrast 07/12/2020:  No acute intracranial abnormality. Mild chronic microvascular ischemic changes. Chronic right caudate  infarct. Short segment marked stenosis of the distal intracranial right vertebral artery.  Transcarotid artery revascularization 08/01/2020: Left transcarotid revascularization (TCAR), stent left internal carotid artery  Lower Venous DVT Study 10/24/2020:  RIGHT:  - No evidence of common femoral vein obstruction.  LEFT:  - Findings consistent with age indeterminate deep vein thrombosis involving the left femoral vein.  - Findings consistent with age indeterminate superficial vein thrombosis involving the left small sahenous vein.  - No cystic structure found in the popliteal fossa.  Venous insufficiency study 12/27/2020: Bilateral iliac veins are patent without thrombosis. There is venous reflux noted in the right saphenofemoral junction, right greater saphenous vein and right short saphenous vein. There is venous reflux noted in the left greater saphenous vein, left short saphenous vein.  Chronic thrombus in the lesser saphenous vein noted in the left saphenofemoral junction consistent with superficial vein thrombosis.  Left common femoral vein thrombosis noted previously on 11/03/2020 is no longer present.  Lower Extremity Venous Duplex  (Left) 03/15/2021: No evidence of deep vein thrombosis of the left lower extremity with normal venous return. No Baker's cyst left knee.   Carotid duplex 05/24/2021: Right Carotid: Velocities in the right ICA are consistent with a 1-39% stenosis. Non-hemodynamically significant plaque <50% noted in the CCA.  Left Carotid: There is no evidence of stenosis in the left ICA. Non-hemodynamically significant plaque <50% noted in the CCA. The ECA appears >50% stenosed. Patent left CCA/ ICA stent.  Vertebrals:  Bilateral vertebral arteries demonstrate antegrade flow.  Subclavians: Right subclavian artery was stenotic. Normal flow hemodynamics were seen in the left subclavian artery.   EKG:   08/09/2021: Sinus rhythm at a rate of 71 bpm with first-degree AV block.   Left axis.  Left anterior fascicular block.  Poor refreshing, cannot exclude anteroseptal infarct old.  LVH.  10/31/20: Sinus bradycardia a 53 bpm with first-degree AV block, left atrial enlargement.  Left axis deviation, left anterior fascicular block.  Poor R wave progression, cannot exclude anteroseptal infarct old. LVH with early repolarization.   07/11/2020: Sinus rhythm at a rate of 70 bpm with first-degree AV block, left atrial enlargement.  Left axis deviation, left anterior fascicular block.  Poor R wave progression, cannot exclude anteroseptal infarct old.  Compared to EKG/16/2021, no significant change.  Assessment     ICD-10-CM   1. Essential hypertension, benign  I10 EKG 12-Lead    2. Carotid stenosis, left  I65.22       CHA2DS2-VASc Score is 4.  Yearly risk of stroke: 4.8% (A, HTN, Vasc Dz).  Score of 1=0.6; 2=2.2; 3=3.2; 4=4.8; 5=7.2; 6=9.8; 7=>9.8) -(CHF; HTN; vasc disease DM,  Male = 1; Age <65 =0; 65-74 = 1,  >75 =2; stroke/embolism= 2).    Meds  ordered this encounter  Medications   isosorbide dinitrate (ISORDIL) 30 MG tablet    Sig: Take 1 tablet (30 mg total) by mouth 3 (three) times daily.    Dispense:  270 tablet    Refill:  3    There are no discontinued medications.   Recommendations:   Dalton Cardenas  is a 87 y.o. 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 Cristopher Peru and recommended either ablation or continued medical therapy.  Patient with recurrence of atypical atrial flutter 06/30/2020. Patient underwent left transcarotid revascularization (TCAR), stent left internal carotid artery by Dr. Ruta Hinds 08/01/2020.   Patient presents for 68-month follow-up, earlier than scheduled due to upcoming extended trip to [place].  Patient is doing well overall and is presently asymptomatic from a cardiovascular standpoint.  Blood pressure  remains uncontrolled, will therefore add isosorbide dinitrate 30 mg p.o. 3 times daily.  Advised patient to hold isosorbide dinitrate and hydralazine if blood pressure <867 mmHg systolic.  Also advised that he may take additional doses of isosorbide dinitrate and hydralazine as needed for blood pressure >619 mmHg systolic.  Patient verbalized understanding agreement.  Personally reviewed external carotid duplex done in September, we will plan to repeat carotid duplex when he returns for continued surveillance.  Patient's hemarthrosis has resolved with reducing Eliquis from 5 mg to 2.5 mg.  Discussed with patient risks versus benefits of discontinuing aspirin and switching him back to Eliquis 5 mg, however patient is resistant to making changes at this time.  EKG and physical exam are stable.  There is no clinical evidence of heart failure.  Follow-up in 6 months, sooner if needed.  Patient was seen in collaboration with Dr. Einar Gip. He also reviewed patient's chart and examined the patient. Dr. Einar Gip is in agreement of the plan.    Alethia Berthold, PA-C 08/09/2021, 1:15 PM Office: [phone]

## 2021-08-21 DIAGNOSIS — I10 Essential (primary) hypertension: Secondary | ICD-10-CM | POA: Diagnosis not present

## 2021-09-13 ENCOUNTER — Ambulatory Visit: Payer: Medicare Other | Admitting: Cardiology

## 2021-11-15 ENCOUNTER — Other Ambulatory Visit: Payer: Self-pay | Admitting: Student

## 2021-11-15 DIAGNOSIS — I10 Essential (primary) hypertension: Secondary | ICD-10-CM

## 2021-11-23 ENCOUNTER — Other Ambulatory Visit: Payer: Self-pay

## 2021-11-23 ENCOUNTER — Encounter: Payer: Self-pay | Admitting: Student

## 2021-11-23 ENCOUNTER — Ambulatory Visit: Payer: Medicare Other | Admitting: Student

## 2021-11-23 VITALS — BP 181/85 | HR 57 | Temp 97.8°F | Resp 16 | Ht 67.0 in | Wt 153.0 lb

## 2021-11-23 DIAGNOSIS — R55 Syncope and collapse: Secondary | ICD-10-CM

## 2021-11-23 DIAGNOSIS — I10 Essential (primary) hypertension: Secondary | ICD-10-CM | POA: Diagnosis not present

## 2021-11-23 MED ORDER — HYDRALAZINE HCL 100 MG PO TABS: ORAL_TABLET | ORAL | 3 refills | Status: DC

## 2021-11-23 MED ORDER — AMLODIPINE BESYLATE 5 MG PO TABS: 5.0000 mg | ORAL_TABLET | Freq: Every day | ORAL | 3 refills | Status: DC

## 2021-11-23 NOTE — Progress Notes (Signed)
Primary Physician/Referring:  Aretta Nip, MD  Patient ID: Dalton Cardenas, male    DOB: September 22, 1933, 88 y.o.   MRN: [id number]  Chief Complaint  Patient presents with   Hypertension   Follow-up   HPI:    Dalton Cardenas  is a 88 y.o. 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 Cristopher Peru and recommended either ablation or continued medical therapy.  Patient with recurrence of atypical atrial flutter 06/30/2020. Patient underwent left transcarotid revascularization (TCAR), stent left internal carotid artery by Dr. Ruta Hinds 08/01/2020.   Patient recently returned from an extended trip to [place].  He now presents to our office for an urgent visit at his request with concerns of elevated blood pressure.  At last office visit added isosorbide dinitrate 30 mg p.o. daily, which she has been unable to tolerate due to worsening GERD symptoms.  Patient states while in [place] he had an episode where he felt lightheaded and nauseous and subsequently had a syncopal episode.  Patient has had no recurrence of this.  However since patient returned to the [place] his blood pressure has been uncontrolled on home monitoring.  Denies chest pain, palpitations, dyspnea, dizziness, vision changes, slurred speech, muscle weakness, gait disturbance.  Past Medical History:  Diagnosis Date   Coronary artery disease    60-70% circumflex (cath in [place] 08/2010).   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 Holter monitoring 2011 - AV node reentry,  multifocal atrial tachycardia, and resting bradycardia. b. previously tx with [facility] in [place].   Past Surgical History:  Procedure Laterality Date   ABLATION OF DYSRHYTHMIC FOCUS  10/13/2014   SVT         by Dr Jacquelyne Balint FISSURE REPAIR     CARDIOVERSION N/A 07/01/2020   Procedure: CARDIOVERSION;  Surgeon: Adrian Prows, MD;  Location: [facility];  Service: Cardiovascular;  Laterality: N/A;   ELECTROPHYSIOLOGY STUDY N/A 10/13/2014   Procedure: ELECTROPHYSIOLOGY STUDY;  Surgeon: Evans Lance, MD;  Location: [facility] CATH LAB;  Service: Cardiovascular;  Laterality: N/A;   EYE SURGERY Bilateral 2011   cataract    PROSTATECTOMY     SUPRAVENTRICULAR TACHYCARDIA ABLATION N/A 10/13/2014   Procedure: SUPRAVENTRICULAR TACHYCARDIA ABLATION;  Surgeon: Evans Lance, MD;  Location: [facility] C[facility] LAB;  Service: Cardiovascular;  Laterality: N/A;   TRANSCAROTID ARTERY REVASCULARIZATION (TCAR) Left 08/01/2020   TRANSCAROTID ARTERY REVASCULARIZATION  Left 08/01/2020   Procedure: LEFT TRANSCAROTID ARTERY REVASCULARIZATION;  Surgeon: Elam Dutch, MD;  Location: [facility] OR;  Service: Vascular;  Laterality: Left;   ULTRASOUND GUIDANCE FOR VASCULAR ACCESS Right 08/01/2020   Procedure: ULTRASOUND GUIDANCE FOR VASCULAR ACCESS;  Surgeon: Elam Dutch, MD;  Location: [facility] OR;  Service: Vascular;  Laterality: Right;   Family History  Problem Relation Age of Onset   Hypertension Mother    Heart disease Father    Hypertension Sister    Hypertension Brother    Ulcers Sister    Hypertension Sister    Diabetes  Neg Hx    Coronary artery disease Neg Hx    Social History   Tobacco Use   Smoking status: Never   Smokeless tobacco: Never  Substance Use Topics   Alcohol use: No   ROS  Review of Systems  Constitutional: Negative for malaise/fatigue and weight gain.  Cardiovascular:  Positive for syncope (10/2021). Negative for chest pain, claudication, leg swelling, near-syncope, orthopnea, palpitations and paroxysmal  nocturnal dyspnea.  Respiratory:  Negative for shortness of breath.   Neurological:  Negative for dizziness.   Objective  Blood pressure (!) 181/85, pulse (!) 57, temperature 97.8 F (36.6 C), resp. rate 16, height '5\' 7"'$  (1.702 m), weight 153 lb (69.4 kg), SpO2 96 %.  Vitals with BMI 11/23/2021 11/23/2021 08/09/2021  Height - '5\' 7"'$  -  Weight - 153 lbs -  BMI - 12.75 -  Systolic 170 017 494  Diastolic 85 90 84  Pulse 57 66 71     Physical Exam Vitals reviewed.  Constitutional:      General: He is not in acute distress. Cardiovascular:     Rate and Rhythm: Normal rate and regular rhythm.     Pulses: Intact distal pulses.     Heart sounds: S1 normal and S2 normal. No murmur heard.   No gallop.     Comments:  Left carotid endarterectomy. Pulmonary:     Effort: Pulmonary effort is normal. No respiratory distress.     Breath sounds: No wheezing, rhonchi or rales.  Musculoskeletal:     Right lower leg: Edema (trace) present.     Left lower leg: Edema (trace) present.  Neurological:     General: No focal deficit present.     Mental Status: He is alert.     Cranial Nerves: No cranial nerve deficit.   Laboratory examination:   Recent Labs    03/15/21 0023  NA 135  K 3.5  CL 102  CO2 24  GLUCOSE 210*  BUN 17  CREATININE 1.03  CALCIUM 9.5  GFRNONAA >60   CrCl cannot be calculated (Patient's most recent lab result is older than the maximum 21 days allowed.).  CMP Latest Ref Rng & Units 03/15/2021 11/08/2020 08/02/2020  Glucose 70 - 99 mg/dL 210(H) 102(H) 98  BUN 8 - 23 mg/dL '17 20 13  '$ Creatinine 0.61 - 1.24 mg/dL 1.03 1.04 1.02  Sodium 135 - 145 mmol/L 135 136 132(L)  Potassium 3.5 - 5.1 mmol/L 3.5 4.8 4.3  Chloride 98 - 111 mmol/L 102 104 104  CO2 22 - 32 mmol/L '24 24 22  '$ Calcium 8.9 - 10.3 mg/dL 9.5 9.4 7.7(L)  Total Protein 6.5 - 8.1 g/dL - 7.5 -  Total Bilirubin 0.3 - 1.2 mg/dL - 0.9 -  Alkaline Phos 38 - 126 U/L - 71 -  AST 15 - 41 U/L - 23 -  ALT 0 - 44 U/L - 21 -    CBC Latest Ref Rng & Units 03/15/2021 11/08/2020 08/02/2020  WBC 4.0 - 10.5 K/uL 10.8(H) 8.2 9.0  Hemoglobin 13.0 - 17.0 g/dL 14.0 11.8(L) 10.2(L)  Hematocrit 39.0 - 52.0 % 41.0 36.1(L) 30.1(L)  Platelets 150 - 400 K/uL 243 267 220   Lipid Panel  No results found for: CHOL, TRIG, HDL, CHOLHDL, VLDL, LDLCALC, LDLDIRECT HEMOGLOBIN A1C No results found for: HGBA1C, MPG TSH No results for input(s): TSH in the last 8760 hours.   External labs 07/06/2019:   Cholesterol, total 189.000 01/25/2020 HDL 46.000 01/25/2020 LDL-C 123.000 01/25/2020 Triglycerides 108.000 01/25/2020  A1C 6.300 01/25/2020 TSH 4.640 01/25/2020   Hemoglobin 12.800 01/25/2020  Creatinine, Serum 1.150 01/25/2020 Potassium 4.100 01/25/2020 Magnesium N/D ALT (SGPT) 20.000 01/25/2020   Cholesterol, total 281.000 07/06/2019 HDL 42.000 07/06/2019 LDL 206.000 07/06/2019 Triglycerides 168.000 07/06/2019 A1C 5.800 07/06/2019 Hemoglobin 12.800 07/06/2019 Creatinine, Serum 1.140 07/06/2019 Potassium 4.200 07/06/2019 Magnesium N/D ALT (SGPT) 12.000 07/06/2019  Allergies   Allergies  Allergen Reactions   Amoxicillin Diarrhea    GI upset: Bleeding diarrhea  Other reaction(s): GI Bleed   Ibuprofen Diarrhea    Bloody diarrhea    Penicillins Diarrhea    Bloody diarrhea    Ace Inhibitors Swelling    Face swelling    Amlodipine Swelling   Amoxicillin-Pot Clavulanate Other (See Comments)   Doxycycline Hyclate Other (See Comments)   Spironolactone     Hyponatremia    Atorvastatin     Weakness    Azithromycin     Stomach ache    Diltiazem Nausea Only    Upset stomach per patient   Hydrochlorothiazide Other (See Comments)    Photosensitivity    Medications Prior to Visit:   Outpatient Medications Prior to Visit  Medication Sig Dispense Refill   acebutolol (SECTRAL) 200 MG capsule Take 1 capsule by mouth twice daily 60 capsule 0   apixaban (ELIQUIS) 5 MG TABS tablet Take 1 tablet (5 mg total) by mouth 2  (two) times daily. (Patient taking differently: Take 2.5 mg by mouth 2 (two) times daily.) 180 tablet 2   aspirin EC 81 MG tablet Take 1 tablet (81 mg total) by mouth daily. 70 tablet 1   b complex vitamins tablet Take 1 tablet by mouth daily.      Cholecalciferol (VITAMIN D) 125 MCG (5000 UT) CAPS Take 5,000 Units by mouth daily.      Coenzyme Q10 (CO Q-10) 200 MG CAPS Take 200 mg by mouth daily.      Cyanocobalamin (B-12) 5000 MCG CAPS Take 5,000 mcg by mouth daily.     Ferrous Sulfate 27 MG TABS Take 27 mg by mouth daily.     folic acid (FOLVITE) 779 MCG tablet Take 800 mcg by mouth daily.     L-Arginine 1000 MG TABS Take 1,000 mg by mouth daily.      LUTEIN PO Take 1 tablet by mouth daily.      Magnesium 250 MG TABS Take 500 mg by mouth daily.      melatonin 5 MG TABS Take 1 tablet by mouth daily as needed.     PAPAYA ENZYME PO Take 1 tablet by mouth daily.     Probiotic Product (ALIGN PO) Take 1 capsule by mouth daily.      rosuvastatin (CRESTOR) 10 MG tablet Take 10 mg by mouth daily.       valsartan (DIOVAN) 160 MG tablet Take 1 tablet (160 mg total) by mouth daily. 90 tablet 3   Vitamin A 2400 MCG (8000 UT) CAPS Take 8,000 Units by mouth daily.     vitamin C (ASCORBIC ACID) 500 MG tablet Take 500 mg by mouth daily.     zinc gluconate 50 MG tablet Take 50 mg by mouth daily.     hydrALAZINE (APRESOLINE) 50 MG tablet TAKE 1 TABLET BY MOUTH 4 TIMES DAILY. MAY TAKE EXTRA  TABLET  IF  BP  OVER  140/70. 120 tablet 0   isosorbide dinitrate (ISORDIL) 30 MG tablet Take 1 tablet (30 mg total) by mouth 3 (three) times daily. 270 tablet 3   No  facility-administered medications prior to visit.   Final Medications at End of Visit    Current Meds  Medication Sig   acebutolol (SECTRAL) 200 MG capsule Take 1 capsule by mouth twice daily   amLODipine (NORVASC) 5 MG tablet Take 1 tablet (5 mg total) by mouth daily.   apixaban (ELIQUIS) 5 MG TABS tablet Take 1 tablet (5 mg total) by mouth 2 (two)  times daily. (Patient taking differently: Take 2.5 mg by mouth 2 (two) times daily.)   aspirin EC 81 MG tablet Take 1 tablet (81 mg total) by mouth daily.   b complex vitamins tablet Take 1 tablet by mouth daily.    Cholecalciferol (VITAMIN D) 125 MCG (5000 UT) CAPS Take 5,000 Units by mouth daily.    Coenzyme Q10 (CO Q-10) 200 MG CAPS Take 200 mg by mouth daily.    Cyanocobalamin (B-12) 5000 MCG CAPS Take 5,000 mcg by mouth daily.   Ferrous Sulfate 27 MG TABS Take 27 mg by mouth daily.   folic acid (FOLVITE) 767 MCG tablet Take 800 mcg by mouth daily.   L-Arginine 1000 MG TABS Take 1,000 mg by mouth daily.    LUTEIN PO Take 1 tablet by mouth daily.    Magnesium 250 MG TABS Take 500 mg by mouth daily.    melatonin 5 MG TABS Take 1 tablet by mouth daily as needed.   PAPAYA ENZYME PO Take 1 tablet by mouth daily.   Probiotic Product (ALIGN PO) Take 1 capsule by mouth daily.    rosuvastatin (CRESTOR) 10 MG tablet Take 10 mg by mouth daily.     valsartan (DIOVAN) 160 MG tablet Take 1 tablet (160 mg total) by mouth daily.   Vitamin A 2400 MCG (8000 UT) CAPS Take 8,000 Units by mouth daily.   vitamin C (ASCORBIC ACID) 500 MG tablet Take 500 mg by mouth daily.   zinc gluconate 50 MG tablet Take 50 mg by mouth daily.   [DISCONTINUED] hydrALAZINE (APRESOLINE) 50 MG tablet TAKE 1 TABLET BY MOUTH 4 TIMES DAILY. MAY TAKE EXTRA  TABLET  IF  BP  OVER  140/70.   [DISCONTINUED] isosorbide dinitrate (ISORDIL) 30 MG tablet Take 1 tablet (30 mg total) by mouth 3 (three) times daily.    Radiology:  No results found.   Chest x-ray 06/30/2020: Lungs are clear.  No pleural effusion or pneumothorax. The heart is normal in size.  Thoracic aortic atherosclerosis. Degenerative changes of the visualized thoracolumbar spine.  CT angiogram chest 11/11/2020: 1. No demonstrable pulmonary embolus. No thoracic aortic aneurysm or dissection. There are foci of aortic atherosclerosis as well as foci of great vessel and  coronary artery calcification.   2. Mild atelectatic change in the left lower lobe and inferior lingula. No edema or airspace opacity. Multiple 1-2 mm nodular opacities noted in each upper lobe. No larger pulmonary nodular opacities evident. No follow-up needed if patient is low-risk (and has no known or suspected primary neoplasm). Non-contrast chest CT can be considered in 12 months if patient is high-risk.  Cardiac Studies:  Coronary Angiogram   [2011]: Performed in [place], 70% stenosis in the circumflex coronary artery. Was performed as a part of workup for EP study for the SVT.  Nuclear stress test   [04/26/2010]: Myocardial perfusion scan 04/26/2010: Normal perfusion, EF 77%.  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  Echocardiogram  06/03/2020: Left ventricle cavity is normal in size. Moderate asymmetric hypertrophy of the left ventricle. Basal septum measures at 2.0 cm. Mild LVOT peak gradient 5 mmHg without SAM or LVOT obstruction. Normal wall motion. LVEF 55-60%. Indeterminate diastolic dysfunction. Left atrial cavity is mildly dilated. Trileaflet aortic valve. Trace aortic stenosis. Moderate (Grade II) aortic regurgitation. Mild calcification, No significant stenosis. Moderate (Grade II) mitral regurgitation. Mild tricuspid regurgitation. Estimated pulmonary artery systolic pressure 30 mmHg.  No significant change compared to previous study in 2018.  CT angio neck 06/28/2020: 1.  Noncalcified atherosclerotic disease in the bifurcation of the right ICA <50% stenosis. 2. 80% stenosis of the proximal left internal carotid artery secondary to noncalcified plaque. 3. 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 4. Aortic  Atherosclerosis (ICD10-I70.0).  CT angio head W or WO Contrast 07/12/2020:  No acute intracranial abnormality. Mild chronic microvascular ischemic changes. Chronic right caudate infarct. Short segment marked stenosis of the distal intracranial right vertebral artery.  Transcarotid artery revascularization 08/01/2020: Left transcarotid revascularization (TCAR), stent left internal carotid artery  Lower Venous DVT Study 10/24/2020:  RIGHT:  - No evidence of common femoral vein obstruction.  LEFT:  - Findings consistent with age indeterminate deep vein thrombosis involving the left femoral vein.  - Findings consistent with age indeterminate superficial vein thrombosis involving the left small sahenous vein.  - No cystic structure found in the popliteal fossa.  Venous insufficiency study 12/27/2020: Bilateral iliac veins are patent without thrombosis. There is venous reflux noted in the right saphenofemoral junction, right greater saphenous vein and right short saphenous vein. There is venous reflux noted in the left greater saphenous vein, left short saphenous vein.  Chronic thrombus in the lesser saphenous vein noted in the left saphenofemoral junction consistent with superficial vein thrombosis.  Left common femoral vein thrombosis noted previously on 11/03/2020 is no longer present.  Lower Extremity Venous Duplex  (Left) 03/15/2021: No evidence of deep vein thrombosis of the left lower extremity with normal venous return. No Baker's cyst left knee.   Carotid duplex 05/24/2021: Right Carotid: Velocities in the right ICA are consistent with a 1-39% stenosis. Non-hemodynamically significant plaque <50% noted in the CCA.  Left Carotid: There is no evidence of stenosis in the left ICA. Non-hemodynamically significant plaque <50% noted in the CCA. The ECA appears >50% stenosed. Patent left CCA/ ICA stent.  Vertebrals:  Bilateral vertebral arteries demonstrate antegrade flow.  Subclavians: Right  subclavian artery was stenotic. Normal flow hemodynamics were seen in the left subclavian artery.   EKG:   08/09/2021: Sinus rhythm at a rate of 71 bpm with first-degree AV block.  Left axis.  Left anterior fascicular block.  Poor refreshing, cannot exclude anteroseptal infarct old.  LVH.  10/31/20: Sinus bradycardia a 53 bpm with first-degree AV block, left atrial enlargement.  Left axis deviation, left anterior fascicular block.  Poor R wave progression, cannot exclude anteroseptal infarct old. LVH with early repolarization.   07/11/2020: Sinus rhythm at a rate of 70 bpm with first-degree AV block, left atrial enlargement.  Left axis deviation, left anterior fascicular block.  Poor R wave progression, cannot exclude anteroseptal infarct old.  Compared to EKG/16/2021, no significant change.  Assessment     ICD-10-CM   1. Essential hypertension, benign  I10 hydrALAZINE (APRESOLINE) 100 MG tablet    CANCELED: LONG TERM MONITOR (3-14 DAYS)    2. Vasovagal syncope  R55  CHA2DS2-VASc Score is 4.  Yearly risk of stroke: 4.8% (A, HTN, Vasc Dz).  Score of 1=0.6; 2=2.2; 3=3.2; 4=4.8; 5=7.2; 6=9.8; 7=>9.8) -(CHF; HTN; vasc disease DM,  Male = 1; Age <65 =0; 65-74 = 1,  >75 =2; stroke/embolism= 2).    Meds ordered this encounter  Medications   hydrALAZINE (APRESOLINE) 100 MG tablet    Sig: TAKE 1 TABLET BY MOUTH 4 TIMES DAILY. MAY TAKE EXTRA  TABLET  IF  BP  OVER  140/70.    Dispense:  90 tablet    Refill:  3   amLODipine (NORVASC) 5 MG tablet    Sig: Take 1 tablet (5 mg total) by mouth daily.    Dispense:  90 tablet    Refill:  3    Medications Discontinued During This Encounter  Medication Reason   isosorbide dinitrate (ISORDIL) 30 MG tablet Side effect (s)   hydrALAZINE (APRESOLINE) 50 MG tablet Reorder     Recommendations:   Dalton Cardenas  is a 88 y.o. 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 Cristopher Peru and recommended either ablation or continued medical therapy.  Patient with recurrence of atypical atrial flutter 06/30/2020. Patient underwent left transcarotid revascularization (TCAR), stent left internal carotid artery by Dr. Ruta Hinds 08/01/2020.   Patient recently returned from an extended trip to [place].  He now presents to our office for an urgent visit at his request with concerns of elevated blood pressure.  At last office visit added isosorbide dinitrate 30 mg p.o. daily, which she was unable to tolerate due to worsening GERD symptoms.  We will stop Isordil.  We will increase hydralazine to 100 mg in the morning and at night, with 50 mg in the afternoon and 50 mg in the early evening.  We will also restart amlodipine 5 mg p.o. daily.  Notably patient has had ankle swelling in the past with amlodipine, however given uncontrolled hypertension feel rechallenging this is appropriate.  Counseled patient regarding measures to reduce/prevent swelling.  Regard to recent episode of syncope, discussed with patient undergoing repeat cardiac monitor to reevaluate for recurrence of underlying atrial flutter/AVNRT.  However patient wishes to hold off at this time.  Follow-up in 2 weeks, sooner if needed, for hypertension.   Alethia Berthold, PA-C 11/23/2021, 11:04 AM Office: [place], PA-C 11/23/2021, 11:04 AM Office: [phone]

## 2021-11-24 DIAGNOSIS — N476 Balanoposthitis: Secondary | ICD-10-CM | POA: Diagnosis not present

## 2021-12-06 ENCOUNTER — Encounter: Payer: Self-pay | Admitting: Student

## 2021-12-06 ENCOUNTER — Ambulatory Visit: Payer: Medicare Other | Admitting: Student

## 2021-12-06 ENCOUNTER — Other Ambulatory Visit: Payer: Self-pay

## 2021-12-06 VITALS — BP 154/72 | HR 56 | Temp 97.9°F | Resp 16 | Ht 67.0 in | Wt 156.0 lb

## 2021-12-06 DIAGNOSIS — I483 Typical atrial flutter: Secondary | ICD-10-CM

## 2021-12-06 DIAGNOSIS — I10 Essential (primary) hypertension: Secondary | ICD-10-CM

## 2021-12-06 MED ORDER — APIXABAN 2.5 MG PO TABS: 2.5000 mg | ORAL_TABLET | Freq: Two times a day (BID) | ORAL | 3 refills | Status: DC

## 2021-12-06 NOTE — Progress Notes (Signed)
? ?Primary Physician/Referring:  Rankins, Bill Salinas, MD ? ?Patient ID: Dalton Cardenas, Dalton Cardenas    DOB: [birth year], 88 y.o.   MRN: [id number] ? ?Chief Complaint  ?Patient presents with  ? Hypertension  ? Follow-up  ? ?HPI:   ? ?Dalton Cardenas  is a 88 y.o. 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 Cristopher Peru and recommended either ablation or continued medical therapy.  Patient with recurrence of atypical atrial flutter 06/30/2020. Patient underwent left transcarotid revascularization (TCAR), stent left internal carotid artery by Dr. Ruta Hinds 08/01/2020.  ? ?Patient presents for 2-week follow-up of hypertension.  At last office visit discontinued isosorbide dinitrate as Dalton Cardenas was unable to tolerate this due to worsening GERD, increase hydralazine to 100 mg in the morning and at night, and restarted amlodipine 5 mg daily.  Dalton Cardenas is tolerating medication changes without issue and blood pressure control has improved.  Dalton Cardenas is enrolled in remote patient monitoring and average has come down to 148/73 mmHg. ? ?The patient's blood pressure is still not at goal Dalton Cardenas is hesitant to make additional medication changes at this time as Dalton Cardenas is currently experiencing high levels of stress and is also not sleeping well. ? ?Patient has had no recurrence of syncope or near syncope and continues to wish to hold off on repeat cardiac monitoring. ? ?Past Medical History:  ?Diagnosis Date  ? Coronary artery disease   ? 60-70% circumflex (cath in [place] 08/2010).  ? Dysrhythmia   ? A history of A flutter and tachycardia (SVT)  ? GERD (gastroesophageal reflux disease)   ? Hypercholesterolemia   ? Hypertension   ? Prostate cancer ([place])   ? status post prostatectomy  ? Sleep apnea   ? 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 SVT (supraventricular  tachycardia) ([place])   ? a. Holter monitoring 2011 - AV node reentry, multifocal atrial tachycardia, and resting bradycardia. b. previously tx with Abana in [place].  ? ?Past Surgical History:  ?Procedure Laterality Date  ? ABLATION OF DYSRHYTHMIC FOCUS  10/13/2014  ? SVT         by Dr Lovena Le  ? ANAL FISSURE REPAIR    ? CARDIOVERSION N/A 07/01/2020  ? Procedure: CARDIOVERSION;  Surgeon: Adrian Prows, MD;  Location: [facility];  Service: Cardiovascular;  Laterality: N/A;  ? ELECTROPHYSIOLOGY STUDY N/A 10/13/2014  ? Procedure: ELECTROPHYSIOLOGY STUDY;  Surgeon: Evans Lance, MD;  Location: [facility] CATH LAB;  Service: Cardiovascular;  Laterality: N/A;  ? EYE SURGERY Bilateral 2011  ? cataract   ? PROSTATECTOMY    ? SUPRAVENTRICULAR TACHYCARDIA ABLATION N/A 10/13/2014  ? Procedure: SUPRAVENTRICULAR TACHYCARDIA ABLATION;  Surgeon: Evans Lance, MD;  Location: [facility] CATH LAB;  Service: Cardiovascular;  Laterality: N/A;  ? TRANSCAROTID ARTERY REVASCULARIZATION (TCAR) Left 08/01/2020  ? TRANSCAROTID ARTERY REVASCULARIZATION?  Left 08/01/2020  ? Procedure: LEFT TRANSCAROTID ARTERY REVASCULARIZATION;  Surgeon: Elam Dutch, MD;  Location: [facility] OR;  Service: Vascular;  Laterality: Left;  ? ULTRASOUND GUIDANCE FOR VASCULAR ACCESS Right 08/01/2020  ? Procedure: ULTRASOUND GUIDANCE FOR VASCULAR ACCESS;  Surgeon: Elam Dutch, MD;  Location: [place];  Service: Vascular;  Laterality: Right;  ? ?Family History  ?Problem Relation Age of Onset  ? Hypertension Mother   ? Heart disease Father   ? Hypertension Sister   ? Hypertension  Brother   ? Ulcers Sister   ? Hypertension Sister   ? Diabetes Neg Hx   ? Coronary artery disease Neg Hx   ? ?Social History  ? ?Tobacco Use  ? Smoking status: Never  ? Smokeless tobacco: Never  ?Substance Use Topics  ? Alcohol use: No  ? ?ROS  ?Review of Systems  ?Constitutional: Negative for malaise/fatigue and weight gain.  ?Cardiovascular:  Positive for syncope (10/2021, no recurrence). Negative for chest  pain, claudication, leg swelling, near-syncope, orthopnea, palpitations and paroxysmal nocturnal dyspnea.  ?Respiratory:  Negative for shortness of breath.   ?Neurological:  Negative for dizziness.  ? ?Objective  ?Blood pressure (!) 154/72, pulse (!) 56, temperature 97.9 ?F (36.6 ?C), temperature source Temporal, resp. rate 16, height '5\' 7"'$  (1.702 m), weight 156 lb (70.8 kg), SpO2 98 %.  ? ?  12/06/2021  ?  2:27 PM 12/06/2021  ?  2:23 PM 11/23/2021  ?  9:43 AM  ?Vitals with BMI  ?Height  '5\' 7"'$    ?Weight  156 lbs   ?BMI  24.43   ?Systolic 726 203 559  ?Diastolic 72 73 85  ?Pulse 56 57 57  ?  ? Physical Exam ?Vitals reviewed.  ?Constitutional:   ?   General: Dalton Cardenas is not in acute distress. ?Cardiovascular:  ?   Rate and Rhythm: Normal rate and regular rhythm.  ?   Pulses: Intact distal pulses.  ?   Heart sounds: S1 normal and S2 normal. No murmur heard. ?  No gallop.  ?   Comments:  Left carotid endarterectomy. ?Pulmonary:  ?   Effort: Pulmonary effort is normal. No respiratory distress.  ?   Breath sounds: No wheezing, rhonchi or rales.  ?Musculoskeletal:  ?   Right lower leg: Edema (trace) present.  ?   Left lower leg: Edema (trace) present.  ?Neurological:  ?   General: No focal deficit present.  ?   Mental Status: Dalton Cardenas is alert.  ?   Cranial Nerves: No cranial nerve deficit.  ?Physical exam unchanged compared to previous office visit. ? ?Laboratory examination:  ? ?Recent Labs  ?  03/15/21 ?0023  ?NA 135  ?K 3.5  ?CL 102  ?CO2 24  ?GLUCOSE 210*  ?BUN 17  ?CREATININE 1.03  ?CALCIUM 9.5  ?GFRNONAA >60  ? ?CrCl cannot be calculated (Patient's most recent lab result is older than the maximum 21 days allowed.).  ? ?  Latest Ref Rng & Units 03/15/2021  ? 12:23 AM 11/08/2020  ?  2:17 PM 08/02/2020  ?  5:00 AM  ?CMP  ?Glucose 70 - 99 mg/dL 210   102   98    ?BUN 8 - 23 mg/dL '17   20   13    '$ ?Creatinine 0.61 - 1.24 mg/dL 1.03   1.04   1.02    ?Sodium 135 - 145 mmol/L 135   136   132    ?Potassium 3.5 - 5.1 mmol/L 3.5   4.8   4.3     ?Chloride 98 - 111 mmol/L 102   104   104    ?CO2 22 - 32 mmol/L '24   24   22    '$ ?Calcium 8.9 - 10.3 mg/dL 9.5   9.4   7.7    ?Total Protein 6.5 - 8.1 g/dL  7.5     ?Total Bilirubin 0.3 - 1.2 mg/dL  0.9     ?Alkaline Phos 38 - 126 U/L  71     ?AST  15 - 41 U/L  23     ?ALT 0 - 44 U/L  21     ? ? ?  Latest Ref Rng & Units 03/15/2021  ? 12:23 AM 11/08/2020  ?  2:17 PM 08/02/2020  ?  5:00 AM  ?CBC  ?WBC 4.0 - 10.5 K/uL 10.8   8.2   9.0    ?Hemoglobin 13.0 - 17.0 g/dL 14.0   11.8   10.2    ?Hematocrit 39.0 - 52.0 % 41.0   36.1   30.1    ?Platelets 150 - 400 K/uL 243   267   220    ? ?Lipid Panel  ?No results found for: CHOL, TRIG, HDL, CHOLHDL, VLDL, LDLCALC, LDLDIRECT ?HEMOGLOBIN A1C ?No results found for: HGBA1C, MPG ?TSH ?No results for input(s): TSH in the last 8760 hours. ? ? ?External labs 07/06/2019:  ? ?Cholesterol, total 189.000 01/25/2020 ?HDL 46.000 01/25/2020 ?LDL-C 123.000 01/25/2020 ?Triglycerides 108.000 01/25/2020 ? ?A1C 6.300 01/25/2020 ?TSH 4.640 01/25/2020  ? ?Hemoglobin 12.800 01/25/2020 ? ?Creatinine, Serum 1.150 01/25/2020 ?Potassium 4.100 01/25/2020 ?Magnesium N/D ?ALT (SGPT) 20.000 01/25/2020 ? ? ?Cholesterol, total 281.000 07/06/2019 ?HDL 42.000 07/06/2019 ?LDL 206.000 07/06/2019 ?Triglycerides 168.000 07/06/2019 ?A1C 5.800 07/06/2019 ?Hemoglobin 12.800 07/06/2019 ?Creatinine, Serum 1.140 07/06/2019 ?Potassium 4.200 07/06/2019 ?Magnesium N/D ?ALT (SGPT) 12.000 07/06/2019 ? ?Allergies  ? ?Allergies  ?Allergen Reactions  ? Amoxicillin Diarrhea  ?  GI upset: Bleeding diarrhea  ?Other reaction(s): GI Bleed  ? Ibuprofen Diarrhea  ?  Bloody diarrhea ?  ? Penicillins Diarrhea  ?  Bloody diarrhea ?  ? Ace Inhibitors Swelling  ?  Face swelling ?  ? Amlodipine Swelling  ? Amoxicillin-Pot Clavulanate Other (See Comments)  ? Doxycycline Hyclate Other (See Comments)  ? Spironolactone   ?  Hyponatremia   ? Atorvastatin   ?  Weakness ?  ? Azithromycin   ?  Stomach ache ?  ? Diltiazem Nausea Only  ?  Upset stomach per  patient  ? Hydrochlorothiazide Other (See Comments)  ?  Photosensitivity  ?  ?Medications Prior to Visit:  ? ?Outpatient Medications Prior to Visit  ?Medication Sig Dispense Refill  ? acebutolol (SECTRAL) 200 MG caps

## 2021-12-07 ENCOUNTER — Ambulatory Visit: Payer: Medicare Other | Admitting: Student

## 2021-12-13 ENCOUNTER — Other Ambulatory Visit: Payer: Self-pay

## 2021-12-13 ENCOUNTER — Other Ambulatory Visit: Payer: Self-pay | Admitting: Cardiology

## 2021-12-13 DIAGNOSIS — I10 Essential (primary) hypertension: Secondary | ICD-10-CM

## 2021-12-13 MED ORDER — HYDRALAZINE HCL 50 MG PO TABS: ORAL_TABLET | ORAL | 3 refills | Status: DC

## 2021-12-13 MED ORDER — ACEBUTOLOL HCL 200 MG PO CAPS: 200.0000 mg | ORAL_CAPSULE | Freq: Two times a day (BID) | ORAL | 3 refills | Status: DC

## 2021-12-13 MED ORDER — VALSARTAN 160 MG PO TABS: 160.0000 mg | ORAL_TABLET | Freq: Every day | ORAL | 3 refills | Status: DC

## 2021-12-13 MED ORDER — HYDRALAZINE HCL 100 MG PO TABS: ORAL_TABLET | ORAL | 3 refills | Status: DC

## 2021-12-22 DIAGNOSIS — I10 Essential (primary) hypertension: Secondary | ICD-10-CM | POA: Diagnosis not present

## 2022-01-02 ENCOUNTER — Other Ambulatory Visit: Payer: Medicare Other

## 2022-01-03 NOTE — Telephone Encounter (Signed)
From patient.

## 2022-01-04 NOTE — Telephone Encounter (Signed)
Let patient know that we will call him regarding his test results after the test. Thank you.

## 2022-01-19 DIAGNOSIS — G4733 Obstructive sleep apnea (adult) (pediatric): Secondary | ICD-10-CM | POA: Diagnosis not present

## 2022-01-23 ENCOUNTER — Other Ambulatory Visit: Payer: Medicare Other

## 2022-02-07 ENCOUNTER — Encounter: Payer: Self-pay | Admitting: Cardiology

## 2022-02-07 ENCOUNTER — Ambulatory Visit: Payer: Medicare Other | Admitting: Cardiology

## 2022-02-07 VITALS — BP 137/56 | HR 55 | Temp 97.7°F | Resp 16 | Ht 67.0 in | Wt 163.6 lb

## 2022-02-07 DIAGNOSIS — I351 Nonrheumatic aortic (valve) insufficiency: Secondary | ICD-10-CM | POA: Diagnosis not present

## 2022-02-07 DIAGNOSIS — I10 Essential (primary) hypertension: Secondary | ICD-10-CM

## 2022-02-07 DIAGNOSIS — Z95828 Presence of other vascular implants and grafts: Secondary | ICD-10-CM

## 2022-02-07 DIAGNOSIS — I6523 Occlusion and stenosis of bilateral carotid arteries: Secondary | ICD-10-CM | POA: Diagnosis not present

## 2022-02-07 NOTE — Progress Notes (Signed)
Primary Physician/Referring:  Aretta Nip, MD  Patient ID: Dalton Cardenas, male    DOB: [birth year], 88 y.o.   MRN: [id number]  Chief Complaint  Patient presents with   Follow-up    6 month   Hypertension   hld   carotid dz   HPI:    Dalton Cardenas  is a 88 y.o. 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 Cristopher Peru and recommended either ablation or continued medical therapy.  Patient with recurrence of atypical atrial flutter 06/30/2020. Patient underwent left transcarotid revascularization (TCAR), stent left internal carotid artery by Dr. Ruta Hinds 08/01/2020.   This is a 86-monthoffice visit, presently doing well and except for mild leg edema from amlodipine he has no specific complaints. He is enrolled in remote patient monitoring.  He is presently asymptomatic.  3 months ago I seen him for syncope which I felt was due to vasovagal episode.  Past Medical History:  Diagnosis Date   Coronary artery disease    60-70% [place]   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 Holter monitoring 2011 - AV node reentry, multifocal atrial tachycardia, and resting bradycardia. b. [place] Ab[place]    Social History   Tobacco Use   Smoking status: Never   Smokeless tobacco: Never  Substance Use Topics   Alcohol use: No   ROS  Review of Systems  Cardiovascular:  Negative for chest pain, dyspnea on exertion and leg swelling.   Objective  Blood pressure (!) 137/56, pulse (!) 55,  temperature 97.7 F (36.5 C), temperature source Temporal, resp. rate 16, height '5\' 7"'$  (1.702 m), weight 163 lb 9.6 oz (74.2 kg), SpO2 97 %.     02/07/2022    2:33 PM 02/07/2022    2:08 PM 12/06/2021    2:27 PM  Vitals with BMI  Height  '5\' 7"'$    Weight  163 lbs 10 oz   BMI  286.75  Systolic [id number] Diastolic 56 72 72  Pulse 55 58 56     Physical Exam Vitals reviewed.  Neck:     Vascular: No JVD.  Cardiovascular:     Rate and Rhythm: Normal rate and regular rhythm.     Pulses: Intact distal pulses.          Carotid pulses are  on the right side with bruit and  on the left side with bruit.    Heart sounds: S1 normal and S2 normal. Murmur heard.  Harsh midsystolic murmur is present with a grade of 2/6 at the upper right sternal border.  Early diastolic murmur is present with a grade of 2/4 at the upper right sternal border radiating to the apex.    No gallop.  Pulmonary:     Effort: Pulmonary effort is normal. No respiratory distress.     Breath sounds: No wheezing, rhonchi or rales.  Abdominal:     General: Bowel sounds are normal.  Palpations: Abdomen is soft.  Musculoskeletal:     Right lower leg: Edema (trace) present.     Left lower leg: Edema (trace) present.  Physical exam unchanged compared to previous office visit.  Laboratory examination:   Recent Labs    03/15/21 0023  NA 135  K 3.5  CL 102  CO2 24  GLUCOSE 210*  BUN 17  CREATININE 1.03  CALCIUM 9.5  GFRNONAA >60   CrCl cannot be calculated (Patient's most recent lab result is older than the maximum 21 days allowed.).     Latest Ref Rng & Units 03/15/2021   12:23 AM 11/08/2020    2:17 PM 08/02/2020    5:00 AM  CMP  Glucose 70 - 99 mg/dL 210   102   98    BUN 8 - 23 mg/dL '17   20   13    '$ Creatinine 0.61 - 1.24 mg/dL 1.03   1.04   1.02    Sodium 135 - 145 mmol/L 135   136   132    Potassium 3.5 - 5.1 mmol/L 3.5   4.8   4.3    Chloride 98 - 111 mmol/L 102   104   104    CO2 22 - 32 mmol/L '24    24   22    '$ Calcium 8.9 - 10.3 mg/dL 9.5   9.4   7.7    Total Protein 6.5 - 8.1 g/dL  7.5     Total Bilirubin 0.3 - 1.2 mg/dL  0.9     Alkaline Phos 38 - 126 U/L  71     AST 15 - 41 U/L  23     ALT 0 - 44 U/L  21         Latest Ref Rng & Units 03/15/2021   12:23 AM 11/08/2020    2:17 PM 08/02/2020    5:00 AM  CBC  WBC 4.0 - 10.5 K/uL 10.8   8.2   9.0    Hemoglobin 13.0 - 17.0 g/dL 14.0   11.8   10.2    Hematocrit 39.0 - 52.0 % 41.0   36.1   30.1    Platelets 150 - 400 K/uL 243   267   220     External labs :   Labs 02/02/2021:  Total cholesterol 187, triglycerides 87, HDL 51, LDL 120. Allergies   Allergies  Allergen Reactions   Amoxicillin Diarrhea    GI upset: Bleeding diarrhea  Other reaction(s): GI Bleed   Ibuprofen Diarrhea    Bloody diarrhea    Penicillins Diarrhea    Bloody diarrhea    Ace Inhibitors Swelling    Face swelling    Amoxicillin-Pot Clavulanate Other (See Comments)   Doxycycline Hyclate Other (See Comments)   Spironolactone     Hyponatremia    Atorvastatin     Weakness    Azithromycin     Stomach ache    Diltiazem Nausea Only    Upset stomach per patient   Hydrochlorothiazide Other (See Comments)    Photosensitivity    Final Medications at End of Visit     Current Outpatient Medications:    acebutolol (SECTRAL) 200 MG capsule, Take 1 capsule (200 mg total) by mouth 2 (two) times daily., Disp: 180 capsule, Rfl: 3   amLODipine (NORVASC) 5 MG tablet, Take 1 tablet (5 mg total) by mouth daily., Disp: 90 tablet, Rfl: 3   apixaban (ELIQUIS) 2.5 MG TABS tablet, Take 1 tablet (2.5  mg total) by mouth 2 (two) times daily., Disp: 180 tablet, Rfl: 3   b complex vitamins tablet, Take 1 tablet by mouth daily. , Disp: , Rfl:    Cholecalciferol (VITAMIN D) 125 MCG (5000 UT) CAPS, Take 5,000 Units by mouth daily. , Disp: , Rfl:    Coenzyme Q10 (CO Q-10) 200 MG CAPS, Take 200 mg by mouth daily. , Disp: , Rfl:    Cyanocobalamin (B-12) 5000 MCG CAPS, Take  5,000 mcg by mouth daily., Disp: , Rfl:    Ferrous Sulfate 27 MG TABS, Take 27 mg by mouth daily., Disp: , Rfl:    folic acid (FOLVITE) 428 MCG tablet, Take 800 mcg by mouth daily., Disp: , Rfl:    hydrALAZINE (APRESOLINE) 100 MG tablet, Take 1 (100 mg) tablet by mouth every morning and evening. Take 1 (50 mg) tablet daily in the afternoon., Disp: 180 tablet, Rfl: 3   hydrALAZINE (APRESOLINE) 50 MG tablet, Take 1 (100 mg) tablet by mouth every morning and evening. Take 1 (50 mg) tablet daily in the afternoon., Disp: 90 tablet, Rfl: 3   L-Arginine 1000 MG TABS, Take 1,000 mg by mouth daily. , Disp: , Rfl:    LUTEIN PO, Take 1 tablet by mouth daily. , Disp: , Rfl:    Magnesium 250 MG TABS, Take 500 mg by mouth daily. , Disp: , Rfl:    melatonin 5 MG TABS, Take 1 tablet by mouth daily as needed., Disp: , Rfl:    Probiotic Product (ALIGN PO), Take 1 capsule by mouth daily. , Disp: , Rfl:    rosuvastatin (CRESTOR) 10 MG tablet, Take 10 mg by mouth daily.  , Disp: , Rfl:    valsartan (DIOVAN) 160 MG tablet, Take 1 tablet (160 mg total) by mouth daily., Disp: 90 tablet, Rfl: 3   Vitamin A 2400 MCG (8000 UT) CAPS, Take 8,000 Units by mouth daily., Disp: , Rfl:    vitamin C (ASCORBIC ACID) 500 MG tablet, Take 500 mg by mouth daily., Disp: , Rfl:    zinc gluconate 50 MG tablet, Take 50 mg by mouth daily., Disp: , Rfl:   Radiology:   Chest x-ray 06/30/2020: Lungs are clear.  No pleural effusion or pneumothorax. The heart is normal in size.  Thoracic aortic atherosclerosis. Degenerative changes of the visualized thoracolumbar spine.  CT angiogram chest 11/11/2020: 1. No demonstrable pulmonary embolus. No thoracic aortic aneurysm or dissection. There are foci of aortic atherosclerosis as well as foci of great vessel and coronary artery calcification.   2. Mild atelectatic change in the left lower lobe and inferior lingula. No edema or airspace opacity. Multiple 1-2 mm nodular opacities noted in each  upper lobe. No larger pulmonary nodular opacities evident. No follow-up needed if patient is low-risk (and has no known or suspected primary neoplasm). Non-contrast chest CT can be considered in 12 months if patient is high-risk.  Cardiac Studies:  Coronary Angiogram   [2011]: Performed in [place], 70% stenosis in the circumflex coronary artery. Was performed as a part of workup for EP study for the SVT.  Nuclear stress test   [04/26/2010]: Myocardial perfusion scan 04/26/2010: Normal perfusion, EF 77%.  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  Echocardiogram 06/03/2020: Left ventricle cavity is normal in size. Moderate asymmetric hypertrophy of the left ventricle. Basal septum measures at 2.0 cm. Mild LVOT peak  gradient 5 mmHg without SAM or LVOT obstruction. Normal wall motion. LVEF 55-60%. Indeterminate diastolic dysfunction. Left atrial cavity is mildly dilated. Trileaflet aortic valve. Trace aortic stenosis. Moderate (Grade II) aortic regurgitation. Mild calcification, No significant stenosis. Moderate (Grade II) mitral regurgitation. Mild tricuspid regurgitation. Estimated pulmonary artery systolic pressure 30 mmHg.  No significant change compared to previous study in 2018.  CT angio neck 06/28/2020: 1.  Noncalcified atherosclerotic disease in the bifurcation of the right ICA <50% stenosis. 2. 80% stenosis of the proximal left internal carotid artery secondary to noncalcified plaque. 3. 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 4. Aortic Atherosclerosis (ICD10-I70.0).  CT angio head W or WO Contrast 07/12/2020:  No acute intracranial abnormality. Mild chronic microvascular ischemic changes. Chronic right caudate  infarct. Short segment marked stenosis of the distal intracranial right vertebral artery.  Transcarotid artery revascularization 08/01/2020: Left transcarotid revascularization (TCAR), stent left internal carotid artery  Carotid duplex 05/24/2021: Right Carotid: Velocities in the right ICA are consistent with a 1-39% stenosis. Non-hemodynamically significant plaque <50% noted in the CCA.  Left Carotid: There is no evidence of stenosis in the left ICA. Non-hemodynamically significant plaque <50% noted in the CCA. The ECA appears >50% stenosed. Patent left CCA/ ICA stent.  Vertebrals:  Bilateral vertebral arteries demonstrate antegrade flow.  Subclavians: Right subclavian artery was stenotic. Normal flow hemodynamics were seen in the left subclavian artery.   EKG:    EKG 02/07/2022: Sinus rhythm with first-degree AV block at rate of 56 bpm, left atrial enlargement, left axis deviation, left anterior fascicular block.  Right bundle branch block.  LVH.  Single PAC.  No significant change from 08/09/2021.   Assessment     ICD-10-CM   1. Essential hypertension, benign  I10 EKG 12-Lead    2. Asymptomatic bilateral carotid artery stenosis  I65.23     3. Presence of internal carotid stent (TCAR Left 2021)  Z95.828     4. Moderate aortic regurgitation  I35.1 PCV ECHOCARDIOGRAM COMPLETE      CHA2DS2-VASc Score is 4.  Yearly risk of stroke: 4.8% (A, HTN, Vasc Dz).  Score of 1=0.6; 2=2.2; 3=3.2; 4=4.8; 5=7.2; 6=9.8; 7=>9.8) -(CHF; HTN; vasc disease DM,  Male = 1; Age <65 =0; 65-74 = 1,  >75 =2; stroke/embolism= 2).    No orders of the defined types were placed in this encounter.   Medications Discontinued During This Encounter  Medication Reason   aspirin EC 81 MG tablet Completed Course      Recommendations:   Dalton Cardenas  is a 88 y.o. 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 Cristopher Peru and recommended either ablation or continued medical therapy.  Patient with recurrence of atypical atrial flutter 06/30/2020. Patient underwent left transcarotid revascularization (TCAR), stent left internal carotid artery by Dr. Ruta Hinds 08/01/2020.  He is presently enrolled in [facility] for hypertension, since that his blood pressure has been well controlled.  This is a 44-monthoffice visit, presently doing well and except for mild leg edema from amlodipine he has no specific complaints.  Physical exam does reveal prominent aortic regurgitation and also mild aortic stenotic murmur along with MR murmur, will repeat echocardiogram as it has been 2 years.  With regard to carotid artery stenosis, he would like to continue to follow-up with  vascular surgery.  He has a carotid artery duplex scheduled in September of this year.  Otherwise stable from cardiac standpoint, I will see him back in 1 year.  With regard to Eliquis for atrial flutter, continue the same in view of high chads vascular score.  Advised him to discontinue aspirin as he has completed a course and carotid artery stent was >1.5 years ago.   He is presently on a statin, he is now taking it on a regular basis, he will need lipid profile testing as it has been a year ago since his last lipid profile test.  He is scheduled to see his PCP for his annual physical exam.   Adrian Prows, PA-C 02/07/2022, 2:55 PM Office: [phone]

## 2022-02-11 IMAGING — CT CT ANGIO CHEST
1 of 2 series · 18 of 32 positions shown · IV contrast (APPLIED)
Comparison: Chest radiograph June 30, 2020.

CLINICAL DATA: Shortness of breath. Reported deep venous thrombosis

EXAM:
CT ANGIOGRAPHY CHEST WITH CONTRAST
TECHNIQUE: Multidetector CT imaging of the chest was performed using the
standard protocol during bolus administration of intravenous
contrast. Multiplanar CT image reconstructions and MIPs were
obtained to evaluate the vascular anatomy.
CONTRAST:  75mL [id number] IOPAMIDOL ([id number]) INJECTION 76%

[Series 5: thins 1.0 b31s · axial · 0.76mm/px · z∈[-294,-17]mm · 18 of 309 slices shown]
[im 16/309  lung]
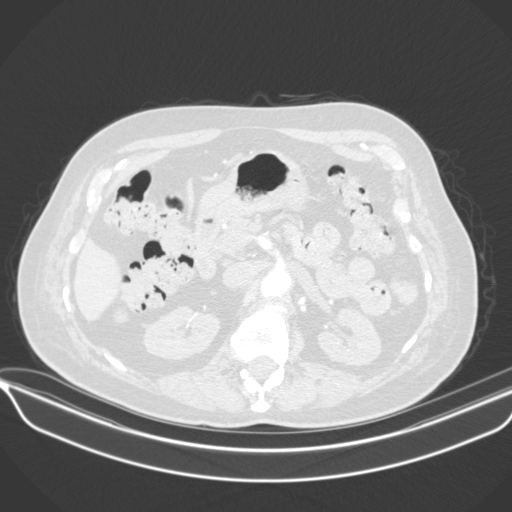
[im 31/309  mediastinal]
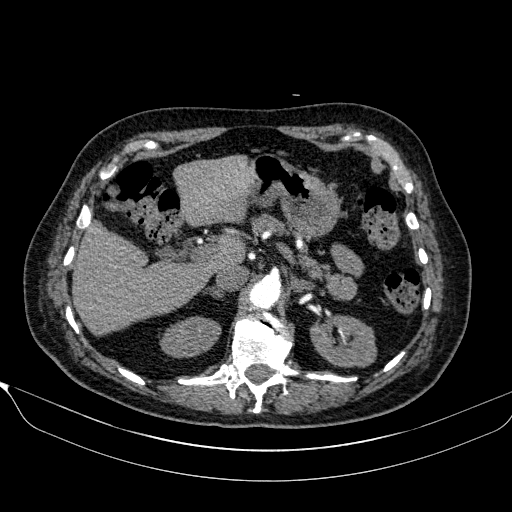
[im 62/309  lung]
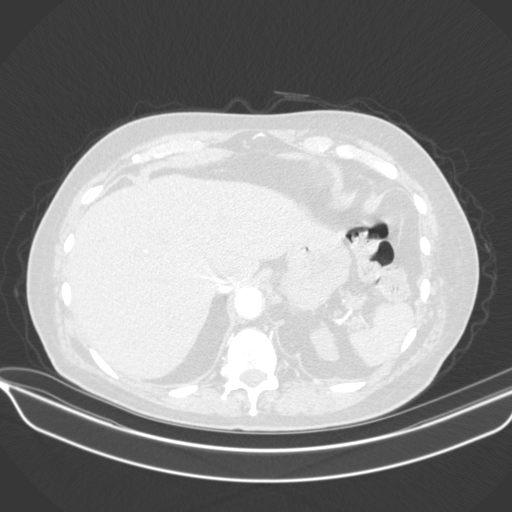
[im 78/309  mediastinal]
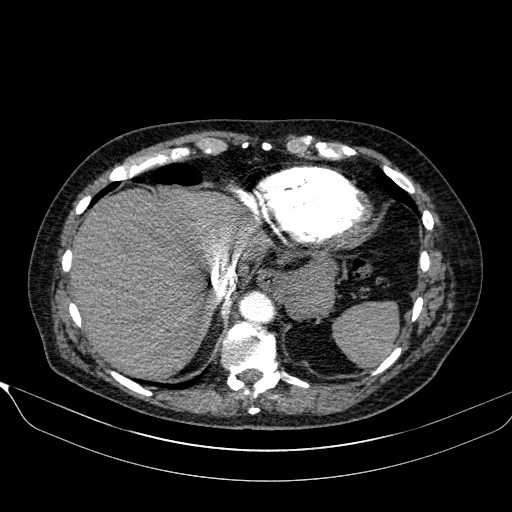
[im 93/309  lung]
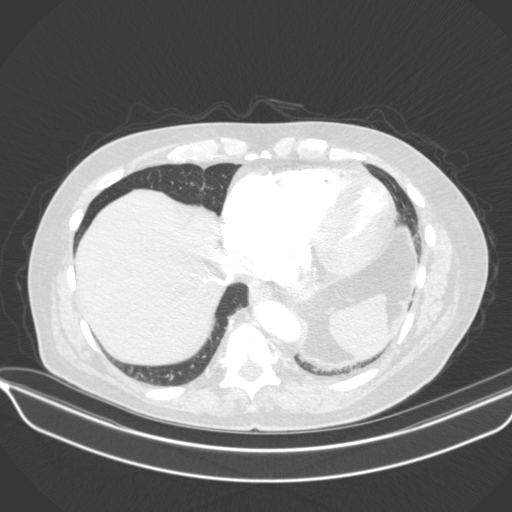
[im 103/309  mediastinal]
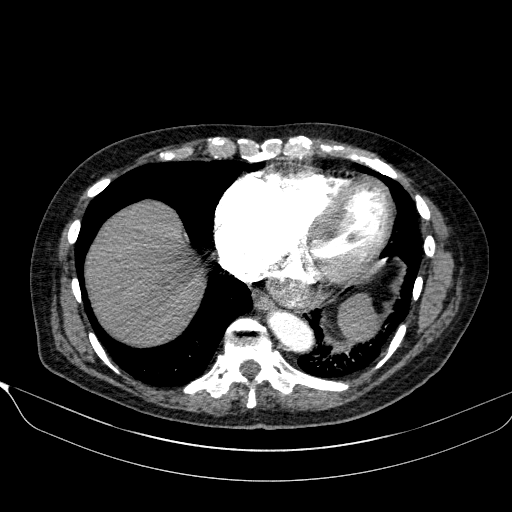
[im 108/309  lung]
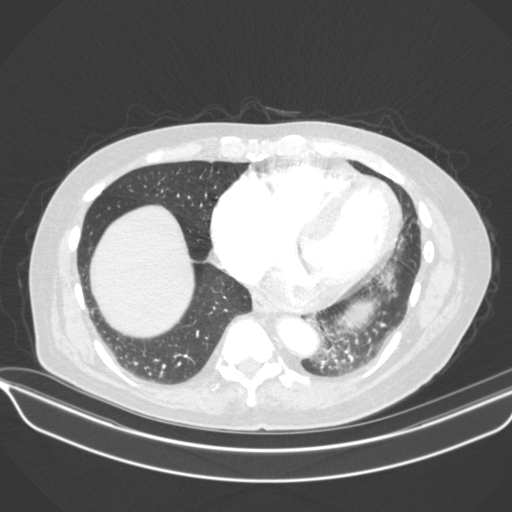
[im 139/309  mediastinal]
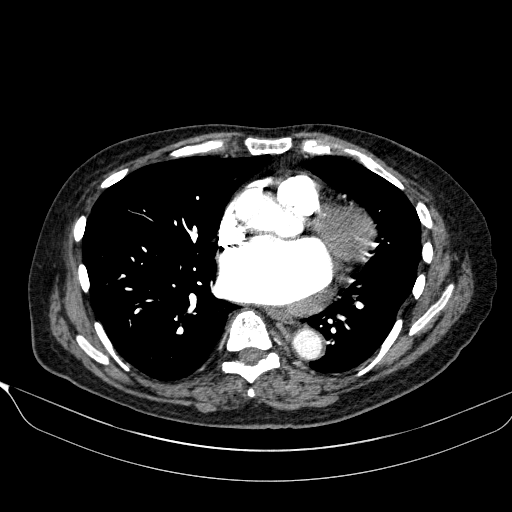
[im 144/309  lung]
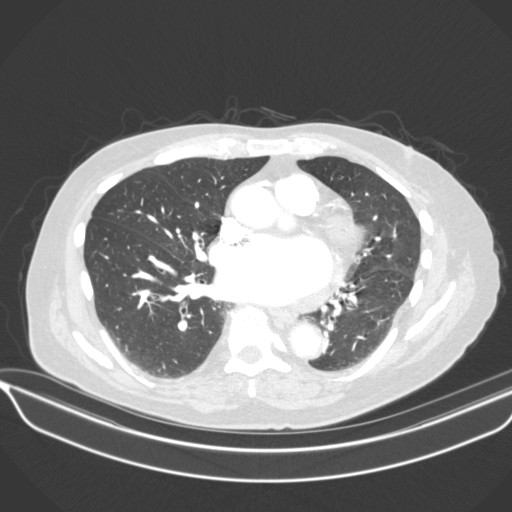
[im 155/309  mediastinal]
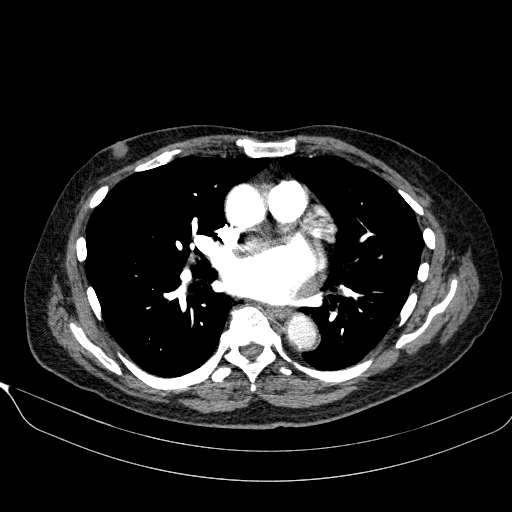
[im 170/309  lung]
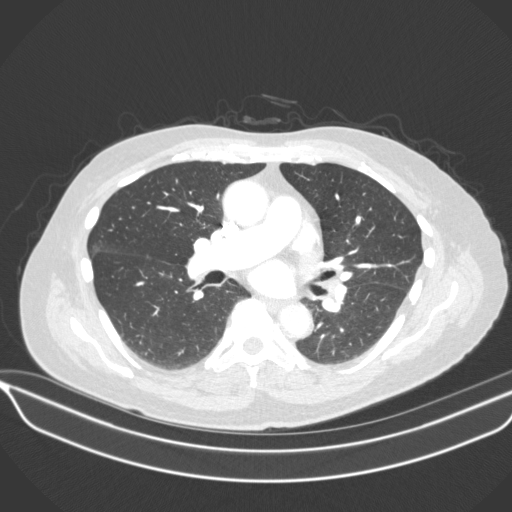
[im 201/309  mediastinal]
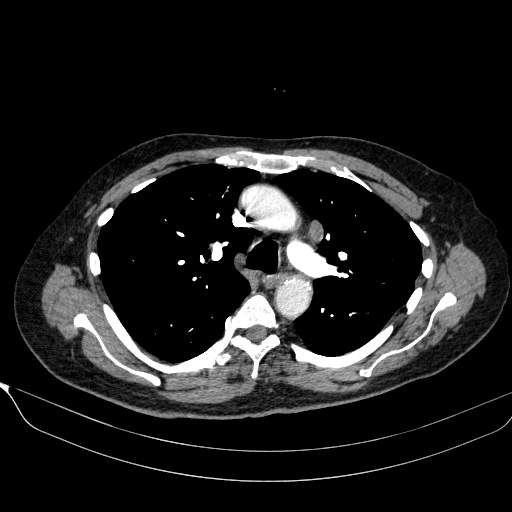
[im 206/309  lung]
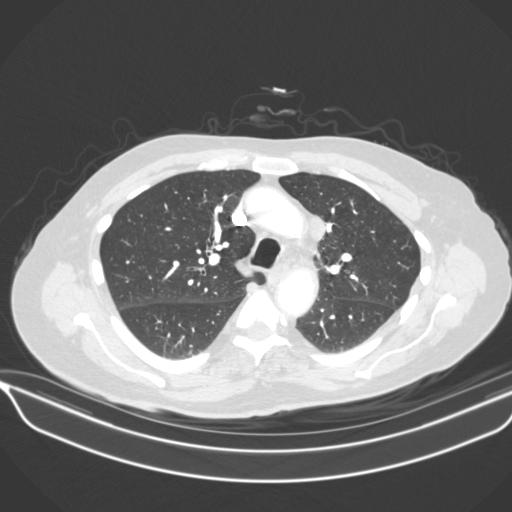
[im 216/309  mediastinal]
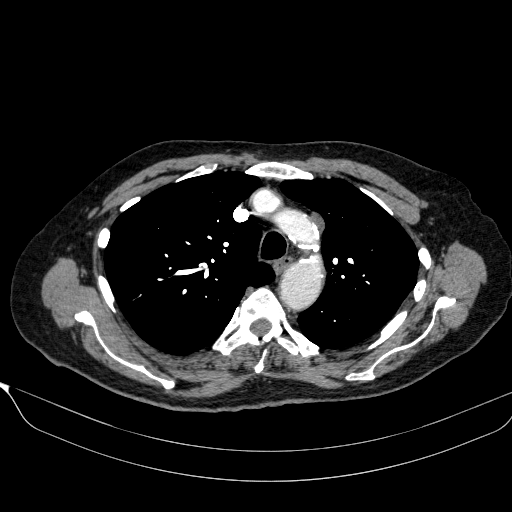
[im 232/309  lung]
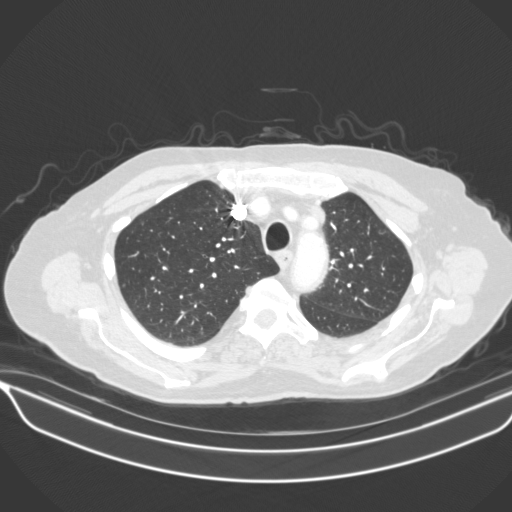
[im 247/309  mediastinal]
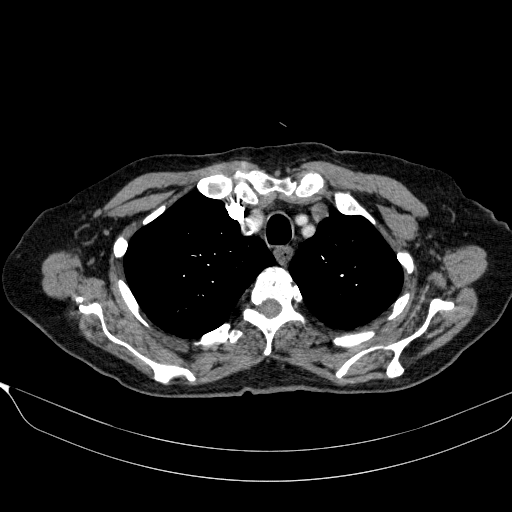
[im 278/309  lung]
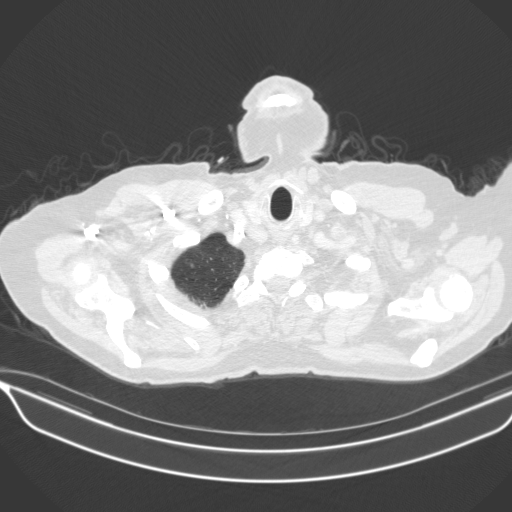
[im 293/309  mediastinal]
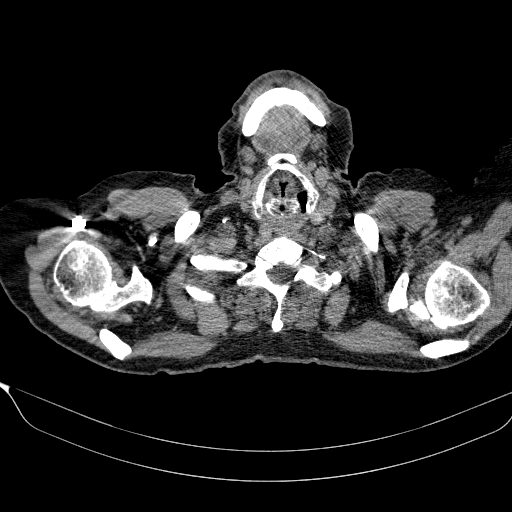

[18 of 32 positions shown; findings below may reference images not displayed]

FINDINGS: Cardiovascular: There is no demonstrable pulmonary embolus. There is
no thoracic aortic aneurysm or dissection. There is tortuosity in
the distal descending aorta. There are foci of scattered
calcification in the visualized great vessels. There are foci of
aortic atherosclerosis. There are foci of coronary artery
calcification. There is no pericardial effusion pericardial
thickening.

Mediastinum/Nodes: Thyroid appears unremarkable. No evident thoracic
adenopathy. No appreciable esophageal lesions.

Lungs/Pleura: There is slight atelectatic change in the left base
and inferior lingula. No edema or airspace opacity. No pleural
effusions. There are multiple 1-2 mm nodular opacities in each upper
lobe. No larger pulmonary nodular opacities are appreciable on this
study. No pneumothorax. Trachea and major bronchial structures
appear patent.

Upper Abdomen: There is upper abdominal aortic atherosclerosis.
Visualized upper abdominal structures otherwise appear unremarkable.

Musculoskeletal: There are foci of degenerative change in the
thoracic spine. There are no blastic or lytic bone lesions. No chest
wall lesions are evident.

Review of the MIP images confirms the above findings.
IMPRESSION: 1. No demonstrable pulmonary embolus. No thoracic aortic aneurysm or
dissection. There are foci of aortic atherosclerosis as well as foci
of great vessel and coronary artery calcification.

2. Mild atelectatic change in the left lower lobe and inferior
lingula. No edema or airspace opacity. Multiple 1-2 mm nodular
opacities noted in each upper lobe. No larger pulmonary nodular
opacities evident. No follow-up needed if patient is low-risk (and
has no known or suspected primary neoplasm). Non-contrast chest CT
can be considered in 12 months if patient is high-risk. This
recommendation follows the consensus statement: Guidelines for
Management of Incidental Pulmonary Nodules Detected on CT Images:

3.  No evident adenopathy.

Aortic Atherosclerosis ([id number]).

## 2022-02-19 ENCOUNTER — Other Ambulatory Visit: Payer: Medicare Other

## 2022-02-21 DIAGNOSIS — I10 Essential (primary) hypertension: Secondary | ICD-10-CM | POA: Diagnosis not present

## 2022-02-23 ENCOUNTER — Ambulatory Visit: Payer: Medicare Other

## 2022-02-23 DIAGNOSIS — I351 Nonrheumatic aortic (valve) insufficiency: Secondary | ICD-10-CM

## 2022-03-01 DIAGNOSIS — K219 Gastro-esophageal reflux disease without esophagitis: Secondary | ICD-10-CM | POA: Diagnosis not present

## 2022-03-01 DIAGNOSIS — I10 Essential (primary) hypertension: Secondary | ICD-10-CM | POA: Diagnosis not present

## 2022-03-01 DIAGNOSIS — E7800 Pure hypercholesterolemia, unspecified: Secondary | ICD-10-CM | POA: Diagnosis not present

## 2022-03-21 ENCOUNTER — Encounter: Payer: Self-pay | Admitting: Cardiology

## 2022-04-23 DIAGNOSIS — I10 Essential (primary) hypertension: Secondary | ICD-10-CM | POA: Diagnosis not present

## 2022-05-15 DIAGNOSIS — W19XXXA Unspecified fall, initial encounter: Secondary | ICD-10-CM | POA: Diagnosis not present

## 2022-05-15 DIAGNOSIS — K219 Gastro-esophageal reflux disease without esophagitis: Secondary | ICD-10-CM | POA: Diagnosis not present

## 2022-05-15 DIAGNOSIS — E7800 Pure hypercholesterolemia, unspecified: Secondary | ICD-10-CM | POA: Diagnosis not present

## 2022-05-15 DIAGNOSIS — Z23 Encounter for immunization: Secondary | ICD-10-CM | POA: Diagnosis not present

## 2022-05-15 DIAGNOSIS — F5101 Primary insomnia: Secondary | ICD-10-CM | POA: Diagnosis not present

## 2022-05-15 DIAGNOSIS — E782 Mixed hyperlipidemia: Secondary | ICD-10-CM | POA: Diagnosis not present

## 2022-05-15 DIAGNOSIS — I4892 Unspecified atrial flutter: Secondary | ICD-10-CM | POA: Diagnosis not present

## 2022-05-15 DIAGNOSIS — S51812A Laceration without foreign body of left forearm, initial encounter: Secondary | ICD-10-CM | POA: Diagnosis not present

## 2022-05-15 DIAGNOSIS — I10 Essential (primary) hypertension: Secondary | ICD-10-CM | POA: Diagnosis not present

## 2022-05-15 DIAGNOSIS — R269 Unspecified abnormalities of gait and mobility: Secondary | ICD-10-CM | POA: Diagnosis not present

## 2022-05-15 DIAGNOSIS — R7303 Prediabetes: Secondary | ICD-10-CM | POA: Diagnosis not present

## 2022-05-15 DIAGNOSIS — D649 Anemia, unspecified: Secondary | ICD-10-CM | POA: Diagnosis not present

## 2022-05-23 DIAGNOSIS — I10 Essential (primary) hypertension: Secondary | ICD-10-CM | POA: Diagnosis not present

## 2022-05-24 DIAGNOSIS — H903 Sensorineural hearing loss, bilateral: Secondary | ICD-10-CM | POA: Diagnosis not present

## 2022-05-24 DIAGNOSIS — H9313 Tinnitus, bilateral: Secondary | ICD-10-CM | POA: Diagnosis not present

## 2022-05-24 DIAGNOSIS — R42 Dizziness and giddiness: Secondary | ICD-10-CM | POA: Diagnosis not present

## 2022-05-30 ENCOUNTER — Other Ambulatory Visit (HOSPITAL_COMMUNITY): Payer: Self-pay

## 2022-05-30 DIAGNOSIS — R1310 Dysphagia, unspecified: Secondary | ICD-10-CM

## 2022-06-04 ENCOUNTER — Ambulatory Visit (HOSPITAL_COMMUNITY)
Admission: RE | Admit: 2022-06-04 | Discharge: 2022-06-04 | Disposition: A | Discharge status: Home / Self Care | Payer: Medicare Other | Source: Ambulatory Visit | Attending: Family Medicine | Admitting: Family Medicine

## 2022-06-04 DIAGNOSIS — R059 Cough, unspecified: Secondary | ICD-10-CM | POA: Diagnosis not present

## 2022-06-04 DIAGNOSIS — R1310 Dysphagia, unspecified: Secondary | ICD-10-CM

## 2022-06-04 DIAGNOSIS — R1312 Dysphagia, oropharyngeal phase: Secondary | ICD-10-CM | POA: Diagnosis not present

## 2022-06-04 DIAGNOSIS — R0989 Other specified symptoms and signs involving the circulatory and respiratory systems: Secondary | ICD-10-CM | POA: Diagnosis not present

## 2022-06-04 DIAGNOSIS — K219 Gastro-esophageal reflux disease without esophagitis: Secondary | ICD-10-CM | POA: Diagnosis not present

## 2022-06-04 NOTE — Progress Notes (Signed)
Objective Swallowing Evaluation: Type of Study: MBS-Modified Barium Swallow Study   Patient Details  Name: TAJAE RYBICKI MRN: [id number] Date of Birth: February 28, 1934  Today's Date: 06/04/2022 Time: SLP Start Time (ACUTE ONLY): 1150 -SLP Stop Time (ACUTE ONLY): 1200  SLP Time Calculation (min) (ACUTE ONLY): 10 min   Past Medical History:  Past Medical History:  Diagnosis Date   Coronary artery disease    60-70% circumflex (cath in [place] 08/2010).   Dysrhythmia    A history of A flutter and tachycardia (SVT)   GERD (gastroesophageal reflux disease)    Hypercholesterolemia    Hypertension    Prostate cancer ([place])    status post prostatectomy   Sleep apnea    Stenosis of subclavian artery ([place]) 06/30/2020    Severe stenosis of the proximal right subclavian artery    SVT (supraventricular tachycardia) ([place])    a. Holter monitoring 2011 - AV node reentry, multifocal atrial tachycardia, and resting bradycardia. b. previously tx with Abana in [place].   Past Surgical History:  Past Surgical History:  Procedure Laterality Date   ABLATION OF DYSRHYTHMIC FOCUS  10/13/2014   SVT         by Dr Jacquelyne Balint FISSURE REPAIR     CARDIOVERSION N/A 07/01/2020   Procedure: CARDIOVERSION;  Surgeon: Adrian Prows, MD;  Location: [place];  Service: Cardiovascular;  Laterality: N/A;   ELECTROPHYSIOLOGY STUDY N/A 10/13/2014   Procedure: ELECTROPHYSIOLOGY STUDY;  Surgeon: Evans Lance, MD;  Location: [facility] CATH LAB;  Service: Cardiovascular;  Laterality: N/A;   EYE SURGERY Bilateral 2011   cataract    PROSTATECTOMY     SUPRAVENTRICULAR TACHYCARDIA ABLATION N/A 10/13/2014   Procedure: SUPRAVENTRICULAR TACHYCARDIA ABLATION;  Surgeon: Evans Lance, MD;  Location: [facility] CATH LAB;  Service: Cardiovascular;  Laterality: N/A;   TRANSCAROTID ARTERY REVASCULARIZATION (TCAR) Left 08/01/2020   TRANSCAROTID ARTERY REVASCULARIZATION  Left 08/01/2020   Procedure: LEFT TRANSCAROTID ARTERY REVASCULARIZATION;   Surgeon: Elam Dutch, MD;  Location: [facility];  Service: Vascular;  Laterality: Left;   ULTRASOUND GUIDANCE FOR VASCULAR ACCESS Right 08/01/2020   Procedure: ULTRASOUND GUIDANCE FOR VASCULAR ACCESS;  Surgeon: Elam Dutch, MD;  Location[facility] OR;  Service: Vascular;  Laterality: Right;   HPI: Pt is an 88 year old male arriving for an OP MBS due to occasional coughing/choking , particularly when taking pills. Pt denies pneumonia or other adverse event.   No data recorded   Recommendations for follow up therapy are one component of a multi-disciplinary discharge planning process, led by the attending physician.  Recommendations may be updated based on patient status, additional functional criteria and insurance authorization.  Assessment / Plan / Recommendation     06/04/2022   12:00 PM  Clinical Impressions  Clinical Impression Pt demonstrates mild oral residue after sips of liquids that he transits to pharynx with pooling in pharynx and eventual, but late swallow. SLP used videofeedback to show pt this problem and with cues pt able to initiate immediate swallow to clear oral residue. Pt had one instance of trace sensed aspiration when trying to practice this strategy. Otherwise, pt noted to have a prominent cricopharyngeus segment that did not impede bolus flow or barium tablet. Esophageal sweep appeared WNL. SLP offered some suggestions about taking pills if pt struggles with this. Pt can continue regular diet and thin liquids. No SLP f/u needed.  SLP Visit Diagnosis Dysphagia, oropharyngeal phase (R13.12)  Impact on safety and function Mild aspiration risk  06/04/2022   12:00 PM  Treatment Recommendations  Treatment Recommendations No treatment recommended at this time         No data to display             06/04/2022   12:00 PM  Diet Recommendations  SLP Diet Recommendations Regular solids;Thin liquid  Liquid Administration via Cup;Straw  Medication Administration  Whole meds with puree  Compensations Multiple dry swallows after each bite/sip          No data to display              No data to display               06/04/2022   12:00 PM  Oral Phase  Oral Phase Impaired  Oral - Thin Cup Lingual/palatal residue  Oral - Thin Straw WFL  Oral - Puree WFL  Oral - Regular Lingual/palatal residue  Oral - Pill [facility]       06/04/2022   12:00 PM  Pharyngeal Phase  Pharyngeal Phase WFL;Impaired  Pharyngeal- Thin Cup Penetration/Aspiration before swallow  Pharyngeal Material enters airway, passes BELOW cords then ejected out        06/04/2022   12:00 PM  Cervical Esophageal Phase   Cervical Esophageal Phase Impaired  Thin Cup Prominent cricopharyngeal segment  Thin Straw Prominent cricopharyngeal segment  Puree Prominent cricopharyngeal segment  Regular Prominent cricopharyngeal segment  Pill Prominent cricopharyngeal segment     Cimberly Stoffel, Katherene Ponto 06/04/2022, 12:53 PM

## 2022-06-11 DIAGNOSIS — R110 Nausea: Secondary | ICD-10-CM | POA: Diagnosis not present

## 2022-06-11 DIAGNOSIS — I4892 Unspecified atrial flutter: Secondary | ICD-10-CM | POA: Diagnosis not present

## 2022-06-11 DIAGNOSIS — R55 Syncope and collapse: Secondary | ICD-10-CM | POA: Diagnosis not present

## 2022-06-11 DIAGNOSIS — D6859 Other primary thrombophilia: Secondary | ICD-10-CM | POA: Diagnosis not present

## 2022-06-13 ENCOUNTER — Ambulatory Visit (HOSPITAL_BASED_OUTPATIENT_CLINIC_OR_DEPARTMENT_OTHER): Payer: Medicare Other | Attending: Internal Medicine | Admitting: Physical Therapy

## 2022-06-13 ENCOUNTER — Encounter (HOSPITAL_BASED_OUTPATIENT_CLINIC_OR_DEPARTMENT_OTHER): Payer: Self-pay | Admitting: Physical Therapy

## 2022-06-13 ENCOUNTER — Telehealth: Payer: Self-pay | Admitting: Cardiology

## 2022-06-13 ENCOUNTER — Other Ambulatory Visit: Payer: Self-pay

## 2022-06-13 DIAGNOSIS — R2689 Other abnormalities of gait and mobility: Secondary | ICD-10-CM | POA: Insufficient documentation

## 2022-06-13 DIAGNOSIS — M25612 Stiffness of left shoulder, not elsewhere classified: Secondary | ICD-10-CM | POA: Insufficient documentation

## 2022-06-13 DIAGNOSIS — M6281 Muscle weakness (generalized): Secondary | ICD-10-CM | POA: Diagnosis not present

## 2022-06-13 NOTE — Telephone Encounter (Signed)
I was called by his PCP that patient has had 2 episodes of syncope that appeared unexplained.  I will schedule him for an office visit.

## 2022-06-13 NOTE — Therapy (Signed)
OUTPATIENT PHYSICAL THERAPY LOWER EXTREMITY EVALUATION   Patient Name: Dalton Cardenas MRN: [id number] DOB:04/02/1934, 88 y.o., male Today's Date: 06/13/2022   PT End of Session - 06/13/22 1654     Visit Number 1    Number of Visits 18    Date for PT Re-Evaluation 09/11/22    Authorization Type UHC MCR    PT Start Time 6314    PT Stop Time 1730    PT Time Calculation (min) 45 min    Activity Tolerance Patient tolerated treatment well    Behavior During Therapy [facility] for tasks assessed/performed             Past Medical History:  Diagnosis Date   Coronary artery disease    60-70% circumflex (cath in [place] 08/2010).   Dysrhythmia    A history of A flutter and tachycardia (SVT)   GERD (gastroesophageal reflux disease)    Hypercholesterolemia    Hypertension    Prostate cancer ([place])    status post prostatectomy   Sleep apnea    Stenosis of subclavian artery ([place]) 06/30/2020    Severe stenosis of the proximal right subclavian artery    SVT (supraventricular tachycardia) ([place])    a. Holter monitoring 2011 - AV node reentry, multifocal atrial tachycardia, and resting bradycardia. b. previously tx with Abana in [place].   Past Surgical History:  Procedure Laterality Date   ABLATION OF DYSRHYTHMIC FOCUS  10/13/2014   SVT         by Dr Jacquelyne Balint FISSURE REPAIR     CARDIOVERSION N/A 07/01/2020   Procedure: CARDIOVERSION;  Surgeon: Adrian Prows, MD;  Location: [place];  Service: Cardiovascular;  Laterality: N/A;   ELECTROPHYSIOLOGY STUDY N/A 10/13/2014   Procedure: ELECTROPHYSIOLOGY STUDY;  Surgeon: Evans Lance, MD;  Location: [facility] CATH LAB;  Service: Cardiovascular;  Laterality: N/A;   EYE SURGERY Bilateral 2011   cataract    PROSTATECTOMY     SUPRAVENTRICULAR TACHYCARDIA ABLATION N/A 10/13/2014   Procedure: SUPRAVENTRICULAR TACHYCARDIA ABLATION;  Surgeon: Evans Lance, MD;  Location: [facility] CATH LAB;  Service: Cardiovascular;  Laterality: N/A;   TRANSCAROTID ARTERY  REVASCULARIZATION (TCAR) Left 08/01/2020   TRANSCAROTID ARTERY REVASCULARIZATION  Left 08/01/2020   Procedure: LEFT TRANSCAROTID ARTERY REVASCULARIZATION;  Surgeon: Elam Dutch, MD;  Location: [place];  Service: Vascular;  Laterality: Left;   ULTRASOUND GUIDANCE FOR VASCULAR ACCESS Right 08/01/2020   Procedure: ULTRASOUND GUIDANCE FOR VASCULAR ACCESS;  Surgeon: Elam Dutch, MD;  Location: [facility] OR;  Service: Vascular;  Laterality: Right;   Patient Active Problem List   Diagnosis Date Noted   Presence of internal carotid stent (TCAR Left 2021) 02/07/2022   Primary osteoarthritis of left knee 05/01/2021   DVT (deep venous thrombosis) ([place]) 11/24/2020   Carotid stenosis 08/01/2020   Hyponatremia 06/30/2020   Atrial flutter ([place]) 06/30/2020   Chronic cough 11/26/2019   Neck pain 07/02/2018   SVT (supraventricular tachycardia) ([place]) 10/13/2014   Syncope 07/22/2012   Coronary artery disease    CHEST PAIN UNSPECIFIED 04/13/2010   ADENOCARCINOMA, PROSTATE 12/12/2009   HYPERCHOLESTEROLEMIA 12/12/2009   Obstructive sleep apnea 12/12/2009   Essential hypertension, benign 12/12/2009   Supraventricular tachycardia 12/12/2009    PCP: Aretta Nip, MD  REFERRING PROVIDER: Leeroy Cha, MD  REFERRING DIAG: R26.9 (ICD-10-CM) - Unspecified abnormalities of gait and mobility  THERAPY DIAG:  Other abnormalities of gait and mobility  Muscle weakness (generalized)  Stiffness of left shoulder, not elsewhere classified  Rationale for  Evaluation and Treatment Rehabilitation  ONSET DATE: 11/2021  SUBJECTIVE:   SUBJECTIVE STATEMENT: Pt states that around 6 months ago his balance was deteriorating. He feels that he is more unstable than before. He will fall towards one direction with walking. Pt denies dizziness. Pt had an episode of syncope over the weekend- may be blood sugar issues but was a hot day and he did not eat much. Pt states that he feels most wobbly when first  waking up and getting going. Pt is still currently driving. No vision issues. Pt's pain is no associated with LOB. Pt walks for exercise usually. Does not go to the gym anymore. Denies 5D's 3N's.   Pt states the L shoulder also feels very stiff. 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 He was told that the ligaments in that shoulder are likely damaged. He did not have specific MOI.  PERTINENT HISTORY: CAD, prostate cancer, syncope, SVT  PAIN:  Are you having pain? Yes: NPRS scale: 2/10 Pain location: R groin Pain description: aching   PRECAUTIONS: None  WEIGHT BEARING RESTRICTIONS No  FALLS:  Has patient fallen in last 6 months? Yes. 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    OBJECTIVE:   DIAGNOSTIC FINDINGS: N/A  PATIENT SURVEYS:  FOTO 72 66pts @ DC 5 pts MCII  COGNITION:  Overall cognitive status: Within functional limits for tasks assessed     SENSATION: WFL   POSTURE: rounded shoulders, increased thoracic kyphosis, and flexed trunk   Very stiff C/S into rotation  UE AROM: grossly limited with all flexion, ABD, and ER tasks above 90, very stiff int BHB reach ER and with ABD  LOWER EXTREMITY ROM: limited with hip ER, extension, and knee flexion with gross assessment during functional tasks and seated exam  LOWER EXTREMITY MMT:  MMT Right eval Left eval  Hip flexion 4/5 4/5  Hip extension    Hip abduction 4/5 4-/5  Hip adduction 4/5 4/5  Hip external rotation 4/5 4/5  Knee flexion    Knee extension 4+/5 4+/5   (Blank rows = not tested)    FUNCTIONAL TESTS:  5 times sit to stand: 22.4 s  BERG Balance Test          Date: eval  Sit  to Stand 3  Standing unsupported 4  Sitting with back unsupported but feet supported 4  Stand to sit  4  Transfers  3  Standing unsupported with eyes closed 3  Standing unsupported feet together 2  From standing position, reach forward with outstretched arm 3  From standing position, pick up object from floor 3  From standing position, turn and look behind over each shoulder 4  Turn 360 4  Standing unsupported, alternately place foot on step 2  Standing unsupported, one foot in front 3  Standing on one leg 2  Total:  44/56     WFL for gait change of speed Increased instability with L and R head turns during gait; L>R  Gait speed: <1 m/s (62f in 13s)      TODAY'S TREATMENT:  Exercises - Sit to Stand Without Arm Support  - 2 x daily - 7 x weekly -  1 sets - 10 reps - Side Stepping with Resistance at Thighs  - 2 x daily - 7 x weekly - 1 sets - 3 reps - 68f hold - Standing Tandem Balance with Counter Support  - 2 x daily - 7 x weekly - 1 sets - 3 reps - 30 hold   PATIENT EDUCATION:  Education details: diagnosis, prognosis, anatomy, balance deficits, exercise progression, DOMS expectations, muscle firing,  envelope of function, HEP, POC  Person educated: Patient Education method: Explanation, Demonstration, and Handouts Education comprehension: verbalized understanding and returned demonstration     HOME EXERCISE PROGRAM:  Access Code: [email or web address]: [email or web address] Date: 06/13/2022 Prepared by: ADaleen Bo  ASSESSMENT:   CLINICAL IMPRESSION: Patient is a 88y.o. male who was seen today for physical therapy evaluation and treatment for c/c of balance and gait deficits. Pt's is largely very functional but does have gait instability with frontal plane motions, L> R. Pt does have more L LE hip abductor weakness as compared to R but is most limited with hip extension and knee extension strength in CKC. Pt is currently at risk for falls given Berg Balance  Scale and mild LOB with dynamic gait. Pt's gait speed also below 1 m/s indicating risk for falls. Pt's history of syncopal episode are concerning for future falls- consider consultation for glucose related issues. Pt would benefit from continued skilled therapy in order to reach goals and maximize functional LE strength and stability for prevention of future falls.       OBJECTIVE IMPAIRMENTS decreased balance, decreased coordination, decreased endurance, decreased knowledge of use of DME, decreased mobility, difficulty walking, decreased strength, decreased safety awareness, impaired sensation, impaired UE functional use, improper body mechanics, postural dysfunction, obesity, and pain.    ACTIVITY LIMITATIONS carrying, lifting, squatting, stairs, transfers, reach over head, and locomotion level   PARTICIPATION LIMITATIONS: cleaning, laundry, community activity, and yard work   PERSONAL FACTORS Education, Fitness, Past/current experiences, Time since onset of injury/illness/exacerbation, and 1-2 comorbidities are also affecting patient's functional outcome.    REHAB POTENTIAL: Good   CLINICAL DECISION MAKING: Stable/uncomplicated   EVALUATION COMPLEXITY: Low     GOALS:    SHORT TERM GOALS: Target date: 07/25/2022   Pt will become independent with HEP in order to demonstrate synthesis of PT education.  Goal status: INITIAL   2.  Pt will score at least 5 pt increase on FOTO to demonstrate functional improvement in MCII and pt perceived function.    Goal status: INITIAL      LONG TERM GOALS: Target date: 09/05/2022     Pt  will become independent with final HEP in order to demonstrate synthesis of PT education.  Goal status: INITIAL   2.   Pt will have an at least 45/56 on Berg Balance scale in order to demonstrate improvement above cut off score for risk of falls.   Goal status: INITIAL   3.   Pt will be able to demonstrate gait speed of 1 m/s or greater in order to  demonstrate functional improvement in LE function for prevention of falls.  Goal status: INITIAL   4.   Pt will score >/= 66 on FOTO to demonstrate improvement in perceived LE/balance function.  Goal status: INITIAL         PLAN: PT FREQUENCY: 1x/week   PT DURATION: 12 weeks (likely DC in 8)   PLANNED INTERVENTIONS: Therapeutic exercises, Therapeutic activity, Neuromuscular re-education, Balance training, Gait training, Patient/Family education, Joint mobilization, Stair training, DME  instructions, Aquatic Therapy, Dry Needling, Electrical stimulation, Cryotherapy, Moist heat, Taping, Ultrasound, Ionotophoresis '4mg'$ /ml Dexamethasone, Manual therapy, and Re-evaluation   PLAN FOR NEXT SESSION:  hip strength, frontal plane stability, lateral lunge, airex balance  Daleen Bo, PT 06/13/2022, 5:52 PM

## 2022-06-19 ENCOUNTER — Ambulatory Visit: Payer: Medicare Other

## 2022-06-19 ENCOUNTER — Other Ambulatory Visit: Payer: Medicare Other

## 2022-06-19 ENCOUNTER — Encounter (HOSPITAL_BASED_OUTPATIENT_CLINIC_OR_DEPARTMENT_OTHER): Payer: Self-pay | Admitting: Physical Therapy

## 2022-06-19 ENCOUNTER — Ambulatory Visit (HOSPITAL_BASED_OUTPATIENT_CLINIC_OR_DEPARTMENT_OTHER): Payer: Medicare Other | Attending: Internal Medicine | Admitting: Physical Therapy

## 2022-06-19 VITALS — BP 184/79 | HR 60 | Temp 97.7°F | Resp 16 | Ht 66.0 in | Wt 161.0 lb

## 2022-06-19 DIAGNOSIS — R55 Syncope and collapse: Secondary | ICD-10-CM

## 2022-06-19 DIAGNOSIS — M6281 Muscle weakness (generalized): Secondary | ICD-10-CM | POA: Diagnosis not present

## 2022-06-19 DIAGNOSIS — R2689 Other abnormalities of gait and mobility: Secondary | ICD-10-CM | POA: Diagnosis not present

## 2022-06-19 DIAGNOSIS — I6523 Occlusion and stenosis of bilateral carotid arteries: Secondary | ICD-10-CM

## 2022-06-19 DIAGNOSIS — I10 Essential (primary) hypertension: Secondary | ICD-10-CM

## 2022-06-19 DIAGNOSIS — I483 Typical atrial flutter: Secondary | ICD-10-CM

## 2022-06-19 MED ORDER — HYDRALAZINE HCL 50 MG PO TABS: ORAL_TABLET | ORAL | 3 refills | Status: DC

## 2022-06-19 MED ORDER — ROSUVASTATIN CALCIUM 10 MG PO TABS: 10.0000 mg | ORAL_TABLET | Freq: Every day | ORAL | 3 refills | Status: DC

## 2022-06-19 MED ORDER — HYDRALAZINE HCL 100 MG PO TABS: ORAL_TABLET | ORAL | 3 refills | Status: DC

## 2022-06-19 NOTE — Progress Notes (Signed)
Primary Physician/Referring:  Aretta Nip, MD  Patient ID: Dalton Cardenas, male    DOB: [birth year], 88 y.o.   MRN: [id number]  Chief Complaint  Patient presents with   Loss of Consciousness   Follow-up   HPI:    Dalton Cardenas  is a 88 y.o. 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 Cristopher Peru and recommended either ablation or continued medical therapy.  Patient with recurrence of atypical atrial flutter 06/30/2020. Patient underwent left transcarotid revascularization (TCAR), stent left internal carotid artery by Dr. Ruta Hinds 08/01/2020.   He presents today due to syncopal event that occurred 10 days ago. He took his regular medications and went to temple, he had not had anything to eat or drink and was out in the sun for several hours. After the event he went for lunch and while standing he became nauseous and passed out. He has been seen previously in office for syncope with similar circumstances as above.  He is active and walks daily. Denies chest pain, shortness of breath, palpitations.  Past Medical History:  Diagnosis Date   Coronary artery disease    60-70% circumflex (cath in [place] 08/2010).   Dysrhythmia    A history of A flutter and tachycardia (SVT)   GERD (gastroesophageal reflux disease)    Hypercholesterolemia    Hypertension    Prostate cancer ([place])    status post prostatectomy   Sleep apnea    Stenosis of subclavian artery ([place]) 06/30/2020    Severe stenosis of the proximal right subclavian artery    SVT (supraventricular tachycardia)    a. Holter monitoring 2011 - AV node reentry, multifocal atrial tachycardia, and resting bradycardia. b. previously tx with Abana in [place].    Social History   Tobacco Use   Smoking status: Never   Smokeless tobacco: Never  Substance Use Topics   Alcohol use: No   ROS  Review of  Systems  Cardiovascular:  Positive for syncope. Negative for chest pain, dyspnea on exertion and leg swelling.  Respiratory:  Negative for shortness of breath.   Neurological:  Negative for dizziness.    Objective  Blood pressure (!) 184/79, pulse 60, temperature 97.7 F (36.5 C), temperature source Temporal, resp. rate 16, height '5\' 6"'$  (1.676 m), weight 161 lb (73 kg), SpO2 96 %.     06/19/2022    2:43 PM 02/07/2022    2:33 PM 02/07/2022    2:08 PM  Vitals with BMI  Height '5\' 6"'$   '5\' 7"'$   Weight 161 lbs  163 lbs 10 oz  BMI 26  96.22  Systolic 297 989 211  Diastolic 79 56 72  Pulse 60 55 58     Physical Exam Vitals reviewed.  Neck:     Vascular: No JVD.  Cardiovascular:     Rate and Rhythm: Normal rate and regular rhythm.     Pulses: Intact distal pulses.          Carotid pulses are  on the right side with bruit and  on the left side with bruit.    Heart sounds: S1 normal and S2 normal. Murmur heard.     Harsh midsystolic murmur is present with a grade of 2/6 at the upper right sternal border.     Early diastolic murmur is present with a grade of 2/4 at the upper right sternal border radiating to  the apex.     No gallop.  Pulmonary:     Effort: Pulmonary effort is normal. No respiratory distress.     Breath sounds: No wheezing, rhonchi or rales.  Abdominal:     General: Bowel sounds are normal.     Palpations: Abdomen is soft.  Musculoskeletal:     Right lower leg: No edema.     Left lower leg: No edema.    Laboratory examination:   No results for input(s): "NA", "K", "CL", "CO2", "GLUCOSE", "BUN", "CREATININE", "CALCIUM", "GFRNONAA", "GFRAA" in the last 8760 hours.  CrCl cannot be calculated (Patient's most recent lab result is older than the maximum 21 days allowed.).     Latest Ref Rng & Units 03/15/2021   12:23 AM 11/08/2020    2:17 PM 08/02/2020    5:00 AM  CMP  Glucose 70 - 99 mg/dL 210  102  98   BUN 8 - 23 mg/dL '17  20  13   '$ Creatinine 0.61 - 1.24 mg/dL 1.03   1.04  1.02   Sodium 135 - 145 mmol/L 135  136  132   Potassium 3.5 - 5.1 mmol/L 3.5  4.8  4.3   Chloride 98 - 111 mmol/L 102  104  104   CO2 22 - 32 mmol/L '24  24  22   '$ Calcium 8.9 - 10.3 mg/dL 9.5  9.4  7.7   Total Protein 6.5 - 8.1 g/dL  7.5    Total Bilirubin 0.3 - 1.2 mg/dL  0.9    Alkaline Phos 38 - 126 U/L  71    AST 15 - 41 U/L  23    ALT 0 - 44 U/L  21        Latest Ref Rng & Units 03/15/2021   12:23 AM 11/08/2020    2:17 PM 08/02/2020    5:00 AM  CBC  WBC 4.0 - 10.5 K/uL 10.8  8.2  9.0   Hemoglobin 13.0 - 17.0 g/dL 14.0  11.8  10.2   Hematocrit 39.0 - 52.0 % 41.0  36.1  30.1   Platelets 150 - 400 K/uL 243  267  220    External labs :   Labs 02/02/2021:  Total cholesterol 187, triglycerides 87, HDL 51, LDL 120. Allergies   Allergies  Allergen Reactions   Amoxicillin Diarrhea    GI upset: Bleeding diarrhea  Other reaction(s): GI Bleed   Ibuprofen Diarrhea    Bloody diarrhea    Penicillins Diarrhea    Bloody diarrhea    Ace Inhibitors Swelling    Face swelling    Amoxicillin-Pot Clavulanate Other (See Comments)   Doxycycline Hyclate Other (See Comments)   Spironolactone     Hyponatremia    Atorvastatin     Weakness    Azithromycin     Stomach ache    Diltiazem Nausea Only    Upset stomach per patient   Hydrochlorothiazide Other (See Comments)    Photosensitivity    Final Medications at End of Visit     Current Outpatient Medications:    acebutolol (SECTRAL) 200 MG capsule, Take 1 capsule (200 mg total) by mouth 2 (two) times daily., Disp: 180 capsule, Rfl: 3   amLODipine (NORVASC) 5 MG tablet, Take 1 tablet (5 mg total) by mouth daily., Disp: 90 tablet, Rfl: 3   apixaban (ELIQUIS) 2.5 MG TABS tablet, Take 1 tablet (2.5 mg total) by mouth 2 (two) times daily., Disp: 180 tablet, Rfl: 3   b complex vitamins  tablet, Take 1 tablet by mouth daily. , Disp: , Rfl:    Cholecalciferol (VITAMIN D) 125 MCG (5000 UT) CAPS, Take 5,000 Units by mouth daily. ,  Disp: , Rfl:    Coenzyme Q10 (CO Q-10) 200 MG CAPS, Take 200 mg by mouth daily. , Disp: , Rfl:    Cyanocobalamin (B-12) 5000 MCG CAPS, Take 5,000 mcg by mouth daily., Disp: , Rfl:    Ferrous Sulfate 27 MG TABS, Take 27 mg by mouth daily., Disp: , Rfl:    folic acid (FOLVITE) 591 MCG tablet, Take 800 mcg by mouth daily., Disp: , Rfl:    L-Arginine 1000 MG TABS, Take 1,000 mg by mouth daily. , Disp: , Rfl:    LUTEIN PO, Take 1 tablet by mouth daily. , Disp: , Rfl:    Magnesium 250 MG TABS, Take 500 mg by mouth daily. , Disp: , Rfl:    melatonin 5 MG TABS, Take 1 tablet by mouth daily as needed., Disp: , Rfl:    Probiotic Product (ALIGN PO), Take 1 capsule by mouth daily. , Disp: , Rfl:    valsartan (DIOVAN) 160 MG tablet, Take 1 tablet (160 mg total) by mouth daily., Disp: 90 tablet, Rfl: 3   Vitamin A 2400 MCG (8000 UT) CAPS, Take 8,000 Units by mouth daily., Disp: , Rfl:    vitamin C (ASCORBIC ACID) 500 MG tablet, Take 500 mg by mouth daily., Disp: , Rfl:    zinc gluconate 50 MG tablet, Take 50 mg by mouth daily., Disp: , Rfl:    hydrALAZINE (APRESOLINE) 100 MG tablet, Take 1 (100 mg) tablet by mouth every morning and evening. Take 1 (50 mg) tablet daily in the afternoon., Disp: 180 tablet, Rfl: 3   hydrALAZINE (APRESOLINE) 50 MG tablet, Take 1 (100 mg) tablet by mouth every morning and evening. Take 1 (50 mg) tablet daily in the afternoon., Disp: 90 tablet, Rfl: 3   rosuvastatin (CRESTOR) 10 MG tablet, Take 1 tablet (10 mg total) by mouth daily., Disp: 90 tablet, Rfl: 3  Radiology:   Chest x-ray 06/30/2020: Lungs are clear.  No pleural effusion or pneumothorax. The heart is normal in size.  Thoracic aortic atherosclerosis. Degenerative changes of the visualized thoracolumbar spine.  CT angiogram chest 11/11/2020: 1. No demonstrable pulmonary embolus. No thoracic aortic aneurysm or dissection. There are foci of aortic atherosclerosis as well as foci of great vessel and coronary artery  calcification. 2. Mild atelectatic change in the left lower lobe and inferior lingula. No edema or airspace opacity. Multiple 1-2 mm nodular opacities noted in each upper lobe. No larger pulmonary nodular opacities evident. No follow-up needed if patient is low-risk (and has no known or suspected primary neoplasm). Non-contrast chest CT can be considered in 12 months if patient is high-risk.  Cardiac Studies:  Coronary Angiogram   [2011]: Performed in [place], 70% stenosis in the circumflex coronary artery. Was performed as a part of workup for EP study for the SVT.  Nuclear stress test   [04/26/2010]: Myocardial perfusion scan 04/26/2010: Normal perfusion, EF 77%.  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  CT angio neck 06/28/2020: 1.  Noncalcified atherosclerotic disease in the bifurcation of the right ICA <50% stenosis. 2. 80% stenosis of the proximal left internal carotid artery secondary to noncalcified plaque. 3. Severe stenosis of the proximal right subclavian artery and decreased  enhancement of the right vertebral artery, consistent with retrograde flow (subclavian steal physiology). 4. Aortic Atherosclerosis (ICD10-I70.0).  CT angio head W or WO Contrast 07/12/2020:  No acute intracranial abnormality. Mild chronic microvascular ischemic changes. Chronic right caudate infarct. Short segment marked stenosis of the distal intracranial right vertebral artery.  Transcarotid artery revascularization 08/01/2020: Left transcarotid revascularization (TCAR), stent left internal carotid artery  Carotid duplex 05/24/2021: Right Carotid: Velocities in the right ICA are consistent with a 1-39% stenosis. Non-hemodynamically significant plaque <50% noted in the CCA.  Left Carotid: There is no evidence of stenosis in the left ICA. Non-hemodynamically significant  plaque <50% noted in the CCA. The ECA appears >50% stenosed. Patent left CCA/ ICA stent.  Vertebrals:  Bilateral vertebral arteries demonstrate antegrade flow.  Subclavians: Right subclavian artery was stenotic. Normal flow hemodynamics were seen in the left subclavian artery.   Echocardiogram 02/23/2022: Normal LV systolic function with visual EF 60-65%. Left ventricle cavity is normal in size. Moderate to severe left ventricular hypertrophy, presence of a septal bulge. Systolic motion of the anterior mitral valve leaflet (SAM).  Normal global wall motion. Unable to accurately evaluate diastolic function due to mitral annular calcification; however, doppler parameters suggestive of grade 2 diastolic dysfunction and elevated LAP. Aortic valve sclerosis without stenosis. Mild to moderate aortic regurgitation. Mild (Grade I) mitral regurgitation. Moderate calcification of the mitral valve annulus. Moderate tricuspid regurgitation. Mild pulmonary hypertension. RVSP measures 39 mmHg. Mild pulmonic regurgitation. Compared to 06/03/2020 Moderate AR is now mild/moderate, moderate MR is now mild, mild PHTN  (RVSP 76mHG compared to before 327mG) / SAM / dilated coronary sinus are new findings.   EKG:    EKG 07/16/2022: Sinus bradycardia with first-degree AV block at rate of 57 bpm.  Left axis deviation.  Left anterior fascicular block.  LVH.  Compared to previous EKG on 02/07/2022 no significant change.  Assessment     ICD-10-CM   1. Vasovagal syncope  R55 EKG 12-Lead    LONG TERM MONITOR (3-14 DAYS)    CANCELED: LONG TERM MONITOR (3-14 DAYS)    2. Essential hypertension, benign  I10 hydrALAZINE (APRESOLINE) 100 MG tablet    hydrALAZINE (APRESOLINE) 50 MG tablet    3. Typical atrial flutter (HCC)  I48.3     4. Asymptomatic bilateral carotid artery stenosis  I65.23       CHA2DS2-VASc Score is 4.  Yearly risk of stroke: 4.8% (A, HTN, Vasc Dz).  Score of 1=0.6; 2=2.2; 3=3.2; 4=4.8; 5=7.2; 6=9.8;  7=>9.8) -(CHF; HTN; vasc disease DM,  Male = 1; Age <65 =0; 65-74 = 1,  >75 =2; stroke/embolism= 2).    Meds ordered this encounter  Medications   hydrALAZINE (APRESOLINE) 100 MG tablet    Sig: Take 1 (100 mg) tablet by mouth every morning and evening. Take 1 (50 mg) tablet daily in the afternoon.    Dispense:  180 tablet    Refill:  3    Pt requesting to have both hydralazine 100 mg and 50 mg tablets in order to keep up with his current dosing schedule.    Order Specific Question:   Supervising Provider    Answer:   GAAdrian [id number]]   hydrALAZINE (APRESOLINE) 50 MG tablet    Sig: Take 1 (100 mg) tablet by mouth every morning and evening. Take 1 (50 mg) tablet daily in the afternoon.    Dispense:  90 tablet    Refill:  3    Pt requesting to have both hydralazine 100  mg and 50 mg in order to continue with his current dosing regimen    Order Specific Question:   Supervising Provider    Answer:   Adrian Prows [[id number]]   rosuvastatin (CRESTOR) 10 MG tablet    Sig: Take 1 tablet (10 mg total) by mouth daily.    Dispense:  90 tablet    Refill:  3    Order Specific Question:   Supervising Provider    Answer:   Adrian Prows [[id number]]    Medications Discontinued During This Encounter  Medication Reason   hydrALAZINE (APRESOLINE) 50 MG tablet Duplicate   hydrALAZINE (APRESOLINE) 423 MG tablet Duplicate   rosuvastatin (CRESTOR) 10 MG tablet Reorder    Recommendations:   Vasovagal syncope Feels that syncopal episode is vasovagal in conjunction with dehydration and prolonged sun exposure.  However, given history of atrial flutter will place cardiac event monitor to rule out syncope related to arrhythmia or sick sinus syndrome. Advised patient that if he has episode of feeling like he may pass out to perform valsalva maneuver, lay down, and elevated legs if possible  Essential hypertension, benign Blood pressure is elevated at today's visit, feel that this is white coat hypertension He is  enrolled in [employer], I have reviewed home BP readings and BP is well controlled Continue current medications without changes  Typical atrial flutter (HCC) No recurrence of atria flutter Continue on Eliquis as ordered in view of elevated cha2ds2-vasc score  Asymptomatic bilateral carotid artery stenosis He continues to follow with vascular surgery, otherwise stable from cardiac standpoint  Follow-up in 6 weeks or sooner if needed   Ernst Spell, NP 06/19/2022, 3:27 PM Office: [phone]

## 2022-06-19 NOTE — Therapy (Signed)
OUTPATIENT PHYSICAL THERAPY LOWER EXTREMITY EVALUATION   Patient Name: Dalton Cardenas MRN: [id number] DOB:[birth year], 88 y.o., male Today's Date: 06/19/2022   PT End of Session - 06/19/22 1020     Visit Number 2    Number of Visits 18    Date for PT Re-Evaluation 09/11/22    Authorization Type UHC MCR    PT Start Time 1017    PT Stop Time 1056    PT Time Calculation (min) 39 min    Activity Tolerance Patient tolerated treatment well    Behavior During Therapy [facility] for tasks assessed/performed             Past Medical History:  Diagnosis Date   Coronary artery disease    60-70% circumflex (cath in [place] 08/2010).   Dysrhythmia    A history of A flutter and tachycardia (SVT)   GERD (gastroesophageal reflux disease)    Hypercholesterolemia    Hypertension    Prostate cancer ([place])    status post prostatectomy   Sleep apnea    Stenosis of subclavian artery ([place]) 06/30/2020    Severe stenosis of the proximal right subclavian artery    SVT (supraventricular tachycardia)    a. Holter monitoring 2011 - AV node reentry, multifocal atrial tachycardia, and resting bradycardia. b. previously tx with Abana in [place].   Past Surgical History:  Procedure Laterality Date   ABLATION OF DYSRHYTHMIC FOCUS  10/13/2014   SVT         by Dr Jacquelyne Balint FISSURE REPAIR     CARDIOVERSION N/A 07/01/2020   Procedure: CARDIOVERSION;  Surgeon: Adrian Prows, MD;  Location: [facility];  Service: Cardiovascular;  Laterality: N/A;   ELECTROPHYSIOLOGY STUDY N/A 10/13/2014   Procedure: ELECTROPHYSIOLOGY STUDY;  Surgeon: Evans Lance, MD;  Location: [facility] CATH LAB;  Service: Cardiovascular;  Laterality: N/A;   EYE SURGERY Bilateral 2011   cataract    PROSTATECTOMY     SUPRAVENTRICULAR TACHYCARDIA ABLATION N/A 10/13/2014   Procedure: SUPRAVENTRICULAR TACHYCARDIA ABLATION;  Surgeon: Evans Lance, MD;  Location: [facility] CATH LAB;  Service: Cardiovascular;  Laterality: N/A;   TRANSCAROTID ARTERY  REVASCULARIZATION (TCAR) Left 08/01/2020   TRANSCAROTID ARTERY REVASCULARIZATION  Left 08/01/2020   Procedure: LEFT TRANSCAROTID ARTERY REVASCULARIZATION;  Surgeon: Elam Dutch, MD;  Location: [facility];  Service: Vascular;  Laterality: Left;   ULTRASOUND GUIDANCE FOR VASCULAR ACCESS Right 08/01/2020   Procedure: ULTRASOUND GUIDANCE FOR VASCULAR ACCESS;  Surgeon: Elam Dutch, MD;  Location: [facility] OR;  Service: Vascular;  Laterality: Right;   Patient Active Problem List   Diagnosis Date Noted   Presence of internal carotid stent (TCAR Left 2021) 02/07/2022   Primary osteoarthritis of left knee 05/01/2021   DVT (deep venous thrombosis) ([place]) 11/24/2020   Carotid stenosis 08/01/2020   Hyponatremia 06/30/2020   Atrial flutter ([place]) 06/30/2020   Chronic cough 11/26/2019   Neck pain 07/02/2018   SVT (supraventricular tachycardia) 10/13/2014   Syncope 07/22/2012   Coronary artery disease    CHEST PAIN UNSPECIFIED 04/13/2010   ADENOCARCINOMA, PROSTATE 12/12/2009   HYPERCHOLESTEROLEMIA 12/12/2009   Obstructive sleep apnea 12/12/2009   Essential hypertension, benign 12/12/2009   Supraventricular tachycardia 12/12/2009    PCP: Aretta Nip, MD  REFERRING PROVIDER: Leeroy Cha, MD  REFERRING DIAG: R26.9 (ICD-10-CM) - Unspecified abnormalities of gait and mobility  THERAPY DIAG:  Other abnormalities of gait and mobility  Muscle weakness (generalized)  Rationale for Evaluation and Treatment Rehabilitation  ONSET DATE: 11/2021  SUBJECTIVE:  SUBJECTIVE STATEMENT:  Pt states that his R hip was bothering hip over the weekend so he did not do as many repetitions.   Eval: Pt states that around 6 months ago his balance was deteriorating. He feels that he is more unstable than before. He will fall towards one direction with walking. Pt denies dizziness. Pt had an episode of syncope over the weekend- may be blood sugar issues but was a hot day and he did not eat  much. Pt states that he feels most wobbly when first waking up and getting going. Pt is still currently driving. No vision issues. Pt's pain is no associated with LOB. Pt walks for exercise usually. Does not go to the gym anymore. Denies 5D's 3N's.   Pt states the L shoulder also feels very stiff. 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 He was told that the ligaments in that shoulder are likely damaged. He did not have specific MOI.  PERTINENT HISTORY: CAD, prostate cancer, syncope, SVT  PAIN:  Are you having pain? Yes: NPRS scale: 2/10 Pain location: R groin Pain description: aching   PRECAUTIONS: None  WEIGHT BEARING RESTRICTIONS No  FALLS:  Has patient fallen in last 6 months? Yes. 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    OBJECTIVE:   DIAGNOSTIC FINDINGS: N/A  PATIENT SURVEYS:  FOTO 6 66pts @ DC 5 pts MCII   TODAY'S TREATMENT:  Nu step L3 5 min L3 warm up Standing hp flexor stretch 30s 3x Standing adductor stretch 30s 2x  STS to mid height table 5x 2x10 NBOS foam EC balance 30s 3x Lateral step up 2x10 - RTB Side Stepping and retro with Resistance at Thighs  - 2 x daily - 7 x weekly - 1 sets - 3 reps - 27f hold - Standing Tandem Balance with Counter Support  - 2 x daily - 7 x weekly - 1 sets - 3 reps - 30 hold   PATIENT EDUCATION:  Education details:  anatomy, balance systems, exercise progression, muscle firing,  envelope of function, HEP, POC  Person educated: Patient Education method: Explanation, Demonstration, and Handouts Education comprehension: verbalized understanding and returned demonstration     HOME EXERCISE  PROGRAM:  Access Code: [email or web address]: [email or web address] Date: 06/13/2022 Prepared by: ADaleen Bo  ASSESSMENT:   CLINICAL IMPRESSION: Pt able to incorporate new balance challenges into session today. Pt continues to have more frontal plane balance deficits at this time but was able to improve postural sway with cuing for feeling centered, abdominal contraction, and maintain mid foot pressure. Pt did find relief of R groin tightness with adductor lunging stretching. HEP updated at this time. Consider addition of lateral step up to HEP at next and decrease height of resisted STS.  Pt would benefit from continued skilled therapy in order to reach goals and maximize functional LE strength and stability for prevention of future falls.       OBJECTIVE IMPAIRMENTS decreased balance, decreased coordination, decreased endurance, decreased knowledge of use of DME, decreased mobility, difficulty walking, decreased strength, decreased safety awareness, impaired sensation, impaired UE functional use, improper body mechanics, postural dysfunction, obesity, and pain.    ACTIVITY LIMITATIONS carrying, lifting,  squatting, stairs, transfers, reach over head, and locomotion level   PARTICIPATION LIMITATIONS: cleaning, laundry, community activity, and yard work   PERSONAL FACTORS Education, Fitness, Past/current experiences, Time since onset of injury/illness/exacerbation, and 1-2 comorbidities are also affecting patient's functional outcome.    REHAB POTENTIAL: Good   CLINICAL DECISION MAKING: Stable/uncomplicated   EVALUATION COMPLEXITY: Low     GOALS:    SHORT TERM GOALS: Target date: 07/31/2022   Pt will become independent with HEP in order to demonstrate synthesis of PT education.  Goal status: INITIAL   2.  Pt will score at least 5 pt increase on FOTO to demonstrate functional improvement in MCII and pt perceived function.    Goal status: INITIAL      LONG TERM GOALS:  Target date: 09/11/2022     Pt  will become independent with final HEP in order to demonstrate synthesis of PT education.  Goal status: INITIAL   2.   Pt will have an at least 45/56 on Berg Balance scale in order to demonstrate improvement above cut off score for risk of falls.   Goal status: INITIAL   3.   Pt will be able to demonstrate gait speed of 1 m/s or greater in order to demonstrate functional improvement in LE function for prevention of falls.  Goal status: INITIAL   4.   Pt will score >/= 66 on FOTO to demonstrate improvement in perceived LE/balance function.  Goal status: INITIAL         PLAN: PT FREQUENCY: 1x/week   PT DURATION: 12 weeks (likely DC in 8)   PLANNED INTERVENTIONS: Therapeutic exercises, Therapeutic activity, Neuromuscular re-education, Balance training, Gait training, Patient/Family education, Joint mobilization, Stair training, DME instructions, Aquatic Therapy, Dry Needling, Electrical stimulation, Cryotherapy, Moist heat, Taping, Ultrasound, Ionotophoresis '4mg'$ /ml Dexamethasone, Manual therapy, and Re-evaluation   PLAN FOR NEXT SESSION:  hip strength, frontal plane stability, lateral lunge, airex balance  Daleen Bo, PT 06/19/2022, 10:59 AM

## 2022-06-21 DIAGNOSIS — R55 Syncope and collapse: Secondary | ICD-10-CM | POA: Diagnosis not present

## 2022-06-22 DIAGNOSIS — I10 Essential (primary) hypertension: Secondary | ICD-10-CM | POA: Diagnosis not present

## 2022-06-28 ENCOUNTER — Encounter (HOSPITAL_BASED_OUTPATIENT_CLINIC_OR_DEPARTMENT_OTHER): Payer: Self-pay

## 2022-06-28 ENCOUNTER — Encounter (HOSPITAL_BASED_OUTPATIENT_CLINIC_OR_DEPARTMENT_OTHER): Payer: Medicare Other | Admitting: Physical Therapy

## 2022-07-04 ENCOUNTER — Ambulatory Visit (HOSPITAL_BASED_OUTPATIENT_CLINIC_OR_DEPARTMENT_OTHER): Payer: Medicare Other | Admitting: Physical Therapy

## 2022-07-04 ENCOUNTER — Encounter (HOSPITAL_BASED_OUTPATIENT_CLINIC_OR_DEPARTMENT_OTHER): Payer: Self-pay | Admitting: Physical Therapy

## 2022-07-04 DIAGNOSIS — M6281 Muscle weakness (generalized): Secondary | ICD-10-CM

## 2022-07-04 DIAGNOSIS — R2689 Other abnormalities of gait and mobility: Secondary | ICD-10-CM

## 2022-07-04 NOTE — Therapy (Signed)
OUTPATIENT PHYSICAL THERAPY LOWER EXTREMITY TREATMENT   Patient Name: Dalton Cardenas MRN: [id number] DOB:[birth year], 88 y.o., male Today's Date: 07/04/2022   PT End of Session - 07/04/22 1509     Visit Number 3    Number of Visits 18    Date for PT Re-Evaluation 09/11/22    Authorization Type U[employer] MCR    PT Start Time 0263    PT Stop Time 1550    PT Time Calculation (min) 40 min    Activity Tolerance Patient tolerated treatment well    Behavior During Therapy [facility] for tasks assessed/performed             Past Medical History:  Diagnosis Date   Coronary artery disease    60-70% circumflex (cath in [place] 08/2010).   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 Holter monitoring 2011 - AV node reentry, multifocal atrial tachycardia, and resting bradycardia. b. previously tx with Abana in [place].   Past Surgical History:  Procedure Laterality Date   ABLATION OF DYSRHYTHMIC FOCUS  10/13/2014   SVT         by Dr Jacquelyne Balint FISSURE REPAIR     CARDIOVERSION N/A 07/01/2020   Procedure: CARDIOVERSION;  Surgeon: Adrian Prows, MD;  Location: [place];  Service: Cardiovascular;  Laterality: N/A;   ELECTROPHYSIOLOGY STUDY N/A 10/13/2014   Procedure: ELECTROPHYSIOLOGY STUDY;  Surgeon: Evans Lance, MD;  Location: [facility] CATH LAB;  Service: Cardiovascular;  Laterality: N/A;   EYE SURGERY Bilateral 2011   cataract    PROSTATECTOMY     SUPRAVENTRICULAR TACHYCARDIA ABLATION N/A 10/13/2014   Procedure: SUPRAVENTRICULAR TACHYCARDIA ABLATION;  Surgeon: Evans Lance, MD;  Location: [facility] CATH LAB;  Service: Cardiovascular;  Laterality: N/A;   TRANSCAROTID ARTERY  REVASCULARIZATION (TCAR) Left 08/01/2020   TRANSCAROTID ARTERY REVASCULARIZATION  Left 08/01/2020   Procedure: LEFT TRANSCAROTID ARTERY REVASCULARIZATION;  Surgeon: Elam Dutch, MD;  Location: [place];  Service: Vascular;  Laterality: Left;   ULTRASOUND GUIDANCE FOR VASCULAR ACCESS Right 08/01/2020   Procedure: ULTRASOUND GUIDANCE FOR VASCULAR ACCESS;  Surgeon: Elam Dutch, MD;  Location: [facility] OR;  Service: Vascular;  Laterality: Right;   Patient Active Problem List   Diagnosis Date Noted   Presence of internal carotid stent (TCAR Left 2021) 02/07/2022   Primary osteoarthritis of left knee 05/01/2021   DVT (deep venous thrombosis) ([place]) 11/24/2020   Carotid stenosis 08/01/2020   Hyponatremia 06/30/2020   Atrial flutter ([place]) 06/30/2020   Chronic cough 11/26/2019   Neck pain 07/02/2018   SVT (supraventricular tachycardia) 10/13/2014   Syncope 07/22/2012   Coronary artery disease    CHEST PAIN UNSPECIFIED 04/13/2010   ADENOCARCINOMA, PROSTATE 12/12/2009   HYPERCHOLESTEROLEMIA 12/12/2009   Obstructive sleep apnea 12/12/2009   Essential hypertension, benign 12/12/2009   Supraventricular tachycardia 12/12/2009    PCP: Aretta Nip, MD  REFERRING PROVIDER: Leeroy Cha, MD  REFERRING DIAG: R26.9 (ICD-10-CM) - Unspecified abnormalities of gait and mobility  THERAPY DIAG:  Other abnormalities of gait and mobility  Muscle weakness (generalized)  Rationale for Evaluation and Treatment Rehabilitation  ONSET DATE: 11/2021  SUBJECTIVE:  SUBJECTIVE STATEMENT:  Pt states that the balance seems to be doing better. He states the R groin is better but the L outer hip is stiff after sitting for a while.   Eval: Pt states that around 6 months ago his balance was deteriorating. He feels that he is more unstable than before. He will fall towards one direction with walking. Pt denies dizziness. Pt had an episode of syncope over the weekend- may be blood sugar  issues but was a hot day and he did not eat much. Pt states that he feels most wobbly when first waking up and getting going. Pt is still currently driving. No vision issues. Pt's pain is no associated with LOB. Pt walks for exercise usually. Does not go to the gym anymore. Denies 5D's 3N's.   Pt states the L shoulder also feels very stiff. 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 He was told that the ligaments in that shoulder are likely damaged. He did not have specific MOI.  PERTINENT HISTORY: CAD, prostate cancer, syncope, SVT  PAIN:  Are you having pain? Yes: NPRS scale: 2/10 Pain location: R groin Pain description: aching   PRECAUTIONS: None  WEIGHT BEARING RESTRICTIONS No  FALLS:  Has patient fallen in last 6 months? Yes. 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    OBJECTIVE:   DIAGNOSTIC FINDINGS: N/A  PATIENT SURVEYS:  FOTO 61 66pts @ DC 5 pts MCII   TODAY'S TREATMENT:  Nu step L4 5 min L3 warm up Seated figure 4 stretch 30s 3x  STS to mid height table with RTB at knees 4x5  Retro and retro woth cog challenge 58f no issues Hell and toe walk 324fx2 no issues Single cone taps atlernating 20x no issues NBOS foam EC balance 30s 3x  Lateral step up 2x10 6" Hurdle march 6x fwd and lateral 2x each Shuttle leg press 62 lbs 3x10 Standing calf raise at step 2x15   PATIENT EDUCATION:  Education details:  anatomy, balance systems, exercise progression, muscle firing,  envelope of function, HEP, POC  Person educated: Patient Education method: Explanation, Demonstration, and Handouts Education comprehension: verbalized understanding and returned  demonstration     HOME EXERCISE PROGRAM:  Access Code: [email or web address]: [email or web address] Date: 06/13/2022 Prepared by: AlDaleen Bo ASSESSMENT:   CLINICAL IMPRESSION: Pt with very good dynamic balance during testing today with little postural instability, even with SL activity. Pt appears to be largely LE strength limited when it comes to complaints of wobbliness. Pt HEP updated today with hip stretching and new hip strengthening exercise. Consider more strength additions to HEP at next. Pt performing and progressing very well with SL stability and dynamic motions. Consider addition of more pliant surface, knee extension  and hip extension strength at next. Of note, pt does have R knee pain that is irritated with CKC movements. Pt would benefit from continued skilled therapy in order to reach goals and maximize functional LE strength and stability for prevention of future falls.       OBJECTIVE IMPAIRMENTS decreased balance, decreased coordination, decreased endurance, decreased knowledge of use of DME, decreased mobility, difficulty walking, decreased strength, decreased safety awareness, impaired sensation,  impaired UE functional use, improper body mechanics, postural dysfunction, obesity, and pain.    ACTIVITY LIMITATIONS carrying, lifting, squatting, stairs, transfers, reach over head, and locomotion level   PARTICIPATION LIMITATIONS: cleaning, laundry, community activity, and yard work   PERSONAL FACTORS Education, Fitness, Past/current experiences, Time since onset of injury/illness/exacerbation, and 1-2 comorbidities are also affecting patient's functional outcome.    REHAB POTENTIAL: Good   CLINICAL DECISION MAKING: Stable/uncomplicated   EVALUATION COMPLEXITY: Low     GOALS:    SHORT TERM GOALS: Target date: 08/15/2022   Pt will become independent with HEP in order to demonstrate synthesis of PT education.  Goal status: INITIAL   2.  Pt will score at  least 5 pt increase on FOTO to demonstrate functional improvement in MCII and pt perceived function.    Goal status: INITIAL      LONG TERM GOALS: Target date: 09/26/2022     Pt  will become independent with final HEP in order to demonstrate synthesis of PT education.  Goal status: INITIAL   2.   Pt will have an at least 45/56 on Berg Balance scale in order to demonstrate improvement above cut off score for risk of falls.   Goal status: INITIAL   3.   Pt will be able to demonstrate gait speed of 1 m/s or greater in order to demonstrate functional improvement in LE function for prevention of falls.  Goal status: INITIAL   4.   Pt will score >/= 66 on FOTO to demonstrate improvement in perceived LE/balance function.  Goal status: INITIAL         PLAN: PT FREQUENCY: 1x/week   PT DURATION: 12 weeks (likely DC in 8)   PLANNED INTERVENTIONS: Therapeutic exercises, Therapeutic activity, Neuromuscular re-education, Balance training, Gait training, Patient/Family education, Joint mobilization, Stair training, DME instructions, Aquatic Therapy, Dry Needling, Electrical stimulation, Cryotherapy, Moist heat, Taping, Ultrasound, Ionotophoresis '4mg'$ /ml Dexamethasone, Manual therapy, and Re-evaluation   PLAN FOR NEXT SESSION:  hip strength, frontal plane stability, lateral lunge, airex balance  Daleen Bo, PT 07/04/2022, 3:52 PM

## 2022-07-11 ENCOUNTER — Ambulatory Visit (HOSPITAL_BASED_OUTPATIENT_CLINIC_OR_DEPARTMENT_OTHER): Payer: Medicare Other | Admitting: Physical Therapy

## 2022-07-13 DIAGNOSIS — R55 Syncope and collapse: Secondary | ICD-10-CM | POA: Diagnosis not present

## 2022-07-18 ENCOUNTER — Ambulatory Visit (HOSPITAL_BASED_OUTPATIENT_CLINIC_OR_DEPARTMENT_OTHER): Payer: Medicare Other | Admitting: Physical Therapy

## 2022-07-25 ENCOUNTER — Encounter (HOSPITAL_BASED_OUTPATIENT_CLINIC_OR_DEPARTMENT_OTHER): Payer: Self-pay | Admitting: Physical Therapy

## 2022-07-25 ENCOUNTER — Ambulatory Visit (HOSPITAL_BASED_OUTPATIENT_CLINIC_OR_DEPARTMENT_OTHER): Payer: Medicare Other | Attending: Internal Medicine | Admitting: Physical Therapy

## 2022-07-25 DIAGNOSIS — R2689 Other abnormalities of gait and mobility: Secondary | ICD-10-CM | POA: Diagnosis not present

## 2022-07-25 DIAGNOSIS — M6281 Muscle weakness (generalized): Secondary | ICD-10-CM | POA: Diagnosis not present

## 2022-07-25 DIAGNOSIS — M25612 Stiffness of left shoulder, not elsewhere classified: Secondary | ICD-10-CM | POA: Insufficient documentation

## 2022-07-25 NOTE — Therapy (Signed)
OUTPATIENT PHYSICAL THERAPY LOWER EXTREMITY TREATMENT   Patient Name: Dalton Cardenas MRN: [id number] DOB:1934/02/25, 88 y.o., male Today's Date: 07/25/2022   PT End of Session - 07/25/22 1522     Visit Number 4    Number of Visits 18    Date for PT Re-Evaluation 09/11/22    Authorization Type UHC MCR    PT Start Time 1518    PT Stop Time 1556    PT Time Calculation (min) 38 min    Activity Tolerance Patient tolerated treatment well    Behavior During Therapy [facility] for tasks assessed/performed             Past Medical History:  Diagnosis Date   Coronary artery disease    60-70% circumflex (cath in [place] 08/2010).   Dysrhythmia    A history of A flutter and tachycardia (SVT)   GERD (gastroesophageal reflux disease)    Hypercholesterolemia    Hypertension    Prostate cancer ([place])    status post prostatectomy   Sleep apnea    Stenosis of subclavian artery ([place]) 06/30/2020    Severe stenosis of the proximal right subclavian artery    SVT (supraventricular tachycardia)    a. Holter monitoring 2011 - AV node reentry, multifocal atrial tachycardia, and resting bradycardia. b. previously tx with Abana in [place].   Past Surgical History:  Procedure Laterality Date   ABLATION OF DYSRHYTHMIC FOCUS  10/13/2014   SVT         by Dr Jacquelyne Balint FISSURE REPAIR     CARDIOVERSION N/A 07/01/2020   Procedure: CARDIOVERSION;  Surgeon: Adrian Prows, MD;  Location: [place];  Service: Cardiovascular;  Laterality: N/A;   ELECTROPHYSIOLOGY STUDY N/A 10/13/2014   Procedure: ELECTROPHYSIOLOGY STUDY;  Surgeon: Evans Lance, MD;  Location: [facility] CATH LAB;  Service: Cardiovascular;  Laterality: N/A;   EYE SURGERY Bilateral 2011   cataract    PROSTATECTOMY     SUPRAVENTRICULAR TACHYCARDIA ABLATION N/A 10/13/2014   Procedure: SUPRAVENTRICULAR TACHYCARDIA ABLATION;  Surgeon: Evans Lance, MD;  Location: [facility] CATH LAB;  Service: Cardiovascular;  Laterality: N/A;   TRANSCAROTID ARTERY  REVASCULARIZATION (TCAR) Left 08/01/2020   TRANSCAROTID ARTERY REVASCULARIZATION  Left 08/01/2020   Procedure: LEFT TRANSCAROTID ARTERY REVASCULARIZATION;  Surgeon: Elam Dutch, MD;  Location: [place];  Service: Vascular;  Laterality: Left;   ULTRASOUND GUIDANCE FOR VASCULAR ACCESS Right 08/01/2020   Procedure: ULTRASOUND GUIDANCE FOR VASCULAR ACCESS;  Surgeon: Elam Dutch, MD;  Location: [facility] OR;  Service: Vascular;  Laterality: Right;   Patient Active Problem List   Diagnosis Date Noted   Presence of internal carotid stent (TCAR Left 2021) 02/07/2022   Primary osteoarthritis of left knee 05/01/2021   DVT (deep venous thrombosis) ([place]) 11/24/2020   Carotid stenosis 08/01/2020   Hyponatremia 06/30/2020   Atrial flutter ([place]) 06/30/2020   Chronic cough 11/26/2019   Neck pain 07/02/2018   SVT (supraventricular tachycardia) 10/13/2014   Syncope 07/22/2012   Coronary artery disease    CHEST PAIN UNSPECIFIED 04/13/2010   ADENOCARCINOMA, PROSTATE 12/12/2009   HYPERCHOLESTEROLEMIA 12/12/2009   Obstructive sleep apnea 12/12/2009   Essential hypertension, benign 12/12/2009   Supraventricular tachycardia 12/12/2009    PCP: Aretta Nip, MD  REFERRING PROVIDER: Leeroy Cha, MD  REFERRING DIAG: R26.9 (ICD-10-CM) - Unspecified abnormalities of gait and mobility  THERAPY DIAG:  Other abnormalities of gait and mobility  Muscle weakness (generalized)  Rationale for Evaluation and Treatment Rehabilitation  ONSET DATE: 11/2021  SUBJECTIVE:  SUBJECTIVE STATEMENT:  Pt states he was sick with COVID and lost some strength while he was out last week. He returns today and is ready for start back up with exercise again. Did not do exercises while at home.   Eval: Pt states that around 6 months ago his balance was deteriorating. He feels that he is more unstable than before. He will fall towards one direction with walking. Pt denies dizziness. Pt had an episode of  syncope over the weekend- may be blood sugar issues but was a hot day and he did not eat much. Pt states that he feels most wobbly when first waking up and getting going. Pt is still currently driving. No vision issues. Pt's pain is no associated with LOB. Pt walks for exercise usually. Does not go to the gym anymore. Denies 5D's 3N's.   Pt states the L shoulder also feels very stiff. 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 He was told that the ligaments in that shoulder are likely damaged. He did not have specific MOI.  PERTINENT HISTORY: CAD, prostate cancer, syncope, SVT  PAIN:  Are you having pain? Yes: NPRS scale: 2/10 Pain location: R groin Pain description: aching   PRECAUTIONS: None  WEIGHT BEARING RESTRICTIONS No  FALLS:  Has patient fallen in last 6 months? Yes. 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    OBJECTIVE:   DIAGNOSTIC FINDINGS: N/A  PATIENT SURVEYS:  FOTO 57 66pts @ DC 5 pts MCII   TODAY'S TREATMENT:   11/18  Nu step L4 6 min warm up  Shuttle leg press, staggered 75lbs 3x10  STS to mid height table with RTB at knees 4x5 NBOS foam EC balance 30s 3x STS 5x5 with GTB at knees focus on RFD/power Sidestepping at rail 3x laps GTB at knees    PATIENT EDUCATION:  Education details:  anatomy, balance systems, exercise progression, muscle firing,  envelope of function, HEP, POC  Person educated: Patient Education method: Explanation, Demonstration, and Handouts Education comprehension: verbalized understanding and returned demonstration     HOME EXERCISE PROGRAM:  Access Code: [email or web address] URL:  [email or web address] Date: 06/13/2022 Prepared by: Daleen Bo   ASSESSMENT:   CLINICAL IMPRESSION: Pt returns today following period of illness. No significant functional deficits but pt does have noticeable fatigue and requires longer seated rest breaks. Pt was able to return to previous exercise today with SOB. Pt was given power focused STS today in order to improve motor recruitment and rate of force development. Plan to continue with dynamic balance and LE strength as tolerated. Pt would benefit from continued skilled therapy in order to reach goals and maximize functional LE strength and stability for prevention of future falls.       OBJECTIVE IMPAIRMENTS decreased balance, decreased coordination, decreased endurance, decreased knowledge of use of DME, decreased mobility, difficulty walking, decreased strength, decreased safety awareness, impaired sensation, impaired UE functional use, improper body mechanics, postural dysfunction, obesity, and pain.    ACTIVITY LIMITATIONS carrying, lifting, squatting, stairs, transfers, reach over head, and locomotion level   PARTICIPATION LIMITATIONS: cleaning, laundry, community activity, and yard work   PERSONAL FACTORS Education, Fitness, Past/current experiences, Time since onset of injury/illness/exacerbation,  and 1-2 comorbidities are also affecting patient's functional outcome.    REHAB POTENTIAL: Good   CLINICAL DECISION MAKING: Stable/uncomplicated   EVALUATION COMPLEXITY: Low     GOALS:    SHORT TERM GOALS: Target date: 09/05/2022   Pt will become independent with HEP in order to demonstrate synthesis of PT education.  Goal status: INITIAL   2.  Pt will score at least 5 pt increase on FOTO to demonstrate functional improvement in MCII and pt perceived function.    Goal status: INITIAL      LONG TERM GOALS: Target date: 10/17/2022     Pt  will become independent with final HEP in order to demonstrate  synthesis of PT education.  Goal status: INITIAL   2.   Pt will have an at least 45/56 on Berg Balance scale in order to demonstrate improvement above cut off score for risk of falls.   Goal status: INITIAL   3.   Pt will be able to demonstrate gait speed of 1 m/s or greater in order to demonstrate functional improvement in LE function for prevention of falls.  Goal status: INITIAL   4.   Pt will score >/= 66 on FOTO to demonstrate improvement in perceived LE/balance function.  Goal status: INITIAL         PLAN: PT FREQUENCY: 1x/week   PT DURATION: 12 weeks (likely DC in 8)   PLANNED INTERVENTIONS: Therapeutic exercises, Therapeutic activity, Neuromuscular re-education, Balance training, Gait training, Patient/Family education, Joint mobilization, Stair training, DME instructions, Aquatic Therapy, Dry Needling, Electrical stimulation, Cryotherapy, Moist heat, Taping, Ultrasound, Ionotophoresis '4mg'$ /ml Dexamethasone, Manual therapy, and Re-evaluation   PLAN FOR NEXT SESSION:  hip strength, frontal plane stability, lateral lunge, airex balance  Daleen Bo, PT 07/25/2022, 3:58 PM

## 2022-07-26 ENCOUNTER — Ambulatory Visit: Payer: Medicare Other

## 2022-07-26 ENCOUNTER — Ambulatory Visit: Payer: Medicare Other | Admitting: Cardiology

## 2022-07-26 ENCOUNTER — Encounter: Payer: Self-pay | Admitting: Cardiology

## 2022-07-26 VITALS — BP 130/74 | HR 60 | Resp 16 | Ht 66.0 in | Wt 160.0 lb

## 2022-07-26 DIAGNOSIS — R55 Syncope and collapse: Secondary | ICD-10-CM

## 2022-07-26 DIAGNOSIS — I484 Atypical atrial flutter: Secondary | ICD-10-CM | POA: Diagnosis not present

## 2022-07-26 DIAGNOSIS — I10 Essential (primary) hypertension: Secondary | ICD-10-CM

## 2022-07-26 NOTE — Progress Notes (Signed)
Primary Physician/Referring:  Aretta Nip, MD  Patient ID: Dalton Cardenas, male    DOB: [birth year], 88 y.o.   MRN: [id number]  Chief Complaint  Patient presents with   Hypertension   Loss of Consciousness   Follow-up    6 week   HPI:    Dalton Cardenas  is a 88 y.o. 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  Hence has been on chronic anticoagulation.  He also has history of left carotid TCAR on 08/01/2020.   He was seen 6 weeks ago for recurrence of syncope again felt to be vasovagal, prior to this in June 2022, Dec 2022 he has had another episode similarly.  He underwent extended EKG monitoring.  He is presently doing well and remains asymptomatic and has not had any recurrence of syncope or near syncope.  Past Medical History:  Diagnosis Date   Coronary artery disease    60-70% circumflex (cath in [place] 08/2010).   Dysrhythmia    A history of A flutter and tachycardia (SVT)   GERD (gastroesophageal reflux disease)    Hypercholesterolemia    Hypertension    Prostate cancer ([place])    status post prostatectomy   Sleep apnea    Stenosis of subclavian artery ([place]) 06/30/2020    Severe stenosis of the proximal right subclavian artery    SVT (supraventricular tachycardia)    a. Holter monitoring 2011 - AV node reentry, multifocal atrial tachycardia, and resting bradycardia. b. previously tx with Abana in [place].    Social History   Tobacco Use   Smoking status: Never   Smokeless tobacco: Never  Substance Use Topics   Alcohol use: No   ROS  Review of Systems  Cardiovascular:  Negative for chest pain, dyspnea on exertion and leg swelling.    Objective  Blood pressure 130/74, pulse 60, resp. rate 16, height '5\' 6"'$  (1.676 m), weight 160 lb (72.6 kg), SpO2 96 %.     07/26/2022    2:31 PM  07/26/2022    2:29 PM 06/19/2022    2:43 PM  Vitals with BMI  Height  '5\' 6"'$  '5\' 6"'$   Weight  160 lbs 161 lbs  BMI  93.26 26  Systolic 712 458 099  Diastolic 74 76 79  Pulse 60 60 60     Physical Exam Vitals reviewed.  Neck:     Vascular: No JVD.  Cardiovascular:     Rate and Rhythm: Normal rate and regular rhythm.     Pulses: Intact distal pulses.          Carotid pulses are  on the right side with bruit and  on the left side with bruit.    Heart sounds: S1 normal and S2 normal. Murmur heard.     Harsh midsystolic murmur is present with a grade of 2/6 at the upper right sternal border.     Early diastolic murmur is present with a grade of 2/4 at the upper right sternal border radiating to the apex.     No gallop.  Pulmonary:     Effort: Pulmonary effort is normal. No respiratory distress.     Breath sounds: No wheezing, rhonchi or rales.  Abdominal:     General: Bowel sounds are normal.     Palpations: Abdomen is soft.  Musculoskeletal:     Right lower leg:  Edema (trace) present.     Left lower leg: Edema (trace) present.   Physical exam unchanged compared to previous office visit.  Laboratory examination:   Lab Results  Component Value Date   NA 135 03/15/2021   K 3.5 03/15/2021   CO2 24 03/15/2021   GLUCOSE 210 (H) 03/15/2021   BUN 17 03/15/2021   CREATININE 1.03 03/15/2021   CALCIUM 9.5 03/15/2021   GFRNONAA >60 03/15/2021    CrCl cannot be calculated (Patient's most recent lab result is older than the maximum 21 days allowed.).     Latest Ref Rng & Units 03/15/2021   12:23 AM 11/08/2020    2:17 PM 08/02/2020    5:00 AM  CMP  Glucose 70 - 99 mg/dL 210  102  98   BUN 8 - 23 mg/dL '17  20  13   '$ Creatinine 0.61 - 1.24 mg/dL 1.03  1.04  1.02   Sodium 135 - 145 mmol/L 135  136  132   Potassium 3.5 - 5.1 mmol/L 3.5  4.8  4.3   Chloride 98 - 111 mmol/L 102  104  104   CO2 22 - 32 mmol/L '24  24  22   '$ Calcium 8.9 - 10.3 mg/dL 9.5  9.4  7.7   Total Protein 6.5 - 8.1  g/dL  7.5    Total Bilirubin 0.3 - 1.2 mg/dL  0.9    Alkaline Phos 38 - 126 U/L  71    AST 15 - 41 U/L  23    ALT 0 - 44 U/L  21        Latest Ref Rng & Units 03/15/2021   12:23 AM 11/08/2020    2:17 PM 08/02/2020    5:00 AM  CBC  WBC 4.0 - 10.5 K/uL 10.8  8.2  9.0   Hemoglobin 13.0 - 17.0 g/dL 14.0  11.8  10.2   Hematocrit 39.0 - 52.0 % 41.0  36.1  30.1   Platelets 150 - 400 K/uL 243  267  220    External labs :   Labs 02/02/2021:  Total cholesterol 187, triglycerides 87, HDL 51, LDL 120. Allergies   Allergies  Allergen Reactions   Amoxicillin Diarrhea    GI upset: Bleeding diarrhea  Other reaction(s): GI Bleed   Ibuprofen Diarrhea    Bloody diarrhea    Penicillins Diarrhea    Bloody diarrhea    Ace Inhibitors Swelling    Face swelling    Amoxicillin-Pot Clavulanate Other (See Comments)   Doxycycline Hyclate Other (See Comments)   Spironolactone     Hyponatremia    Atorvastatin     Weakness    Azithromycin     Stomach ache    Diltiazem Nausea Only    Upset stomach per patient   Hydrochlorothiazide Other (See Comments)    Photosensitivity    Final Medications at End of Visit     Current Outpatient Medications:    acebutolol (SECTRAL) 200 MG capsule, Take 1 capsule (200 mg total) by mouth 2 (two) times daily., Disp: 180 capsule, Rfl: 3   amLODipine (NORVASC) 5 MG tablet, Take 1 tablet (5 mg total) by mouth daily., Disp: 90 tablet, Rfl: 3   apixaban (ELIQUIS) 2.5 MG TABS tablet, Take 1 tablet (2.5 mg total) by mouth 2 (two) times daily., Disp: 180 tablet, Rfl: 3   b complex vitamins tablet, Take 1 tablet by mouth daily. , Disp: , Rfl:    Cholecalciferol (VITAMIN D) 125 MCG (5000 UT)  CAPS, Take 5,000 Units by mouth daily. , Disp: , Rfl:    Coenzyme Q10 (CO Q-10) 200 MG CAPS, Take 200 mg by mouth daily. , Disp: , Rfl:    Cyanocobalamin (B-12) 5000 MCG CAPS, Take 5,000 mcg by mouth daily., Disp: , Rfl:    folic acid (FOLVITE) 361 MCG tablet, Take 800 mcg by  mouth daily., Disp: , Rfl:    hydrALAZINE (APRESOLINE) 100 MG tablet, Take 1 (100 mg) tablet by mouth every morning and evening. Take 1 (50 mg) tablet daily in the afternoon., Disp: 180 tablet, Rfl: 3   hydrALAZINE (APRESOLINE) 50 MG tablet, Take 1 (100 mg) tablet by mouth every morning and evening. Take 1 (50 mg) tablet daily in the afternoon., Disp: 90 tablet, Rfl: 3   L-Arginine 1000 MG TABS, Take 1,000 mg by mouth daily. , Disp: , Rfl:    LUTEIN PO, Take 1 tablet by mouth daily. , Disp: , Rfl:    Magnesium 250 MG TABS, Take 500 mg by mouth daily. , Disp: , Rfl:    melatonin 5 MG TABS, Take 1 tablet by mouth daily as needed., Disp: , Rfl:    Probiotic Product (ALIGN PO), Take 1 capsule by mouth daily. , Disp: , Rfl:    rosuvastatin (CRESTOR) 10 MG tablet, Take 1 tablet (10 mg total) by mouth daily., Disp: 90 tablet, Rfl: 3   valsartan (DIOVAN) 160 MG tablet, Take 1 tablet (160 mg total) by mouth daily., Disp: 90 tablet, Rfl: 3   Vitamin A 2400 MCG (8000 UT) CAPS, Take 8,000 Units by mouth daily., Disp: , Rfl:    vitamin C (ASCORBIC ACID) 500 MG tablet, Take 500 mg by mouth daily., Disp: , Rfl:    zinc gluconate 50 MG tablet, Take 50 mg by mouth daily., Disp: , Rfl:   Radiology:   Chest x-ray 06/30/2020: Lungs are clear.  No pleural effusion or pneumothorax. The heart is normal in size.  Thoracic aortic atherosclerosis. Degenerative changes of the visualized thoracolumbar spine.  CT angiogram chest 11/11/2020: 1. No demonstrable pulmonary embolus. No thoracic aortic aneurysm or dissection. There are foci of aortic atherosclerosis as well as foci of great vessel and coronary artery calcification.   2. Mild atelectatic change in the left lower lobe and inferior lingula. No edema or airspace opacity. Multiple 1-2 mm nodular opacities noted in each upper lobe. No larger pulmonary nodular opacities evident. No follow-up needed if patient is low-risk (and has no known or suspected primary  neoplasm). Non-contrast chest CT can be considered in 12 months if patient is high-risk.  Cardiac Studies:  Coronary Angiogram   [2011]: Performed in [place], 70% stenosis in the circumflex coronary artery. Was performed as a part of workup for EP study for the SVT.  CT angio head W or WO Contrast 07/12/2020:  No acute intracranial abnormality. Mild chronic microvascular ischemic changes. Chronic right caudate infarct. Short segment marked stenosis of the distal intracranial right vertebral artery.  Carotid duplex 05/24/2021: Right Carotid: Velocities in the right ICA are consistent with a 1-39% stenosis. Non-hemodynamically significant plaque <50% noted in the CCA.  Left Carotid: There is no evidence of stenosis in the left ICA. Non-hemodynamically significant plaque <50% noted in the CCA. The ECA appears >50% stenosed. Patent left CCA/ ICA stent.  Vertebrals:  Bilateral vertebral arteries demonstrate antegrade flow.  Subclavians: Right subclavian artery was stenotic. Normal flow hemodynamics were seen in the left subclavian artery.   PCV ECHOCARDIOGRAM COMPLETE 02/23/2022  Normal LV  systolic function with visual EF 60-65%. Left ventricle cavity is normal in size. Moderate to severe left ventricular hypertrophy, presence of a septal bulge. Systolic motion of the anterior mitral valve leaflet (SAM).  Normal global wall motion. Unable to accurately evaluate diastolic function due to mitral annular calcification; however, doppler parameters suggestive of grade 2 diastolic dysfunction and elevated LAP. Aortic valve sclerosis without stenosis. Mild to moderate aortic regurgitation. Mild (Grade I) mitral regurgitation. Moderate calcification of the mitral valve annulus. Moderate tricuspid regurgitation. Mild pulmonary hypertension. RVSP measures 39 mmHg. Mild pulmonic regurgitation. Compared to 06/03/2020 Moderate AR is now mild/moderate, moderate MR is now mild, mild PHTN (RVSP 68mHG compared to  before 388mG) / SAM / dilated coronary sinus are new findings.  Zio Patch Extended out patient EKG monitoring 13 days starting 07/03/2022:   Dominant rhythm Sinus.  First degree AV block was present. Ventricular bigeminy was present, longest episode 4.6 seconds. HR 39-92 bpm. Avg HR 55 bpm. No atrial fibrillation/atrial flutter/SVT/VT/high grade AV block, sinus pause >3sec noted. 14 runs of probable atrial tachycardia with fastest interval lasting 6 beats with a max heart rate of 113 bpm. Isolated SVE <1.0%, SVE Couplets <1.0%, SVE Triplets <1.0% Isolated VE <1%, VE Couplets <1.0%, VE Triplets <1.0% Symptoms reported: None  EKG:    EKG 07/16/2022: Sinus bradycardia with first-degree AV block at rate of 57 bpm.  Left axis deviation.  Left anterior fascicular block.  LVH.  Compared to previous EKG on 02/07/2022 no significant change.   Assessment     ICD-10-CM   1. Vasovagal syncope  R55     2. Atypical atrial flutter (HCC)  I48.4     3. Primary hypertension  I10       CHA2DS2-VASc Score is 4.  Yearly risk of stroke: 4.8% (A, HTN, Vasc Dz).  Score of 1=0.6; 2=2.2; 3=3.2; 4=4.8; 5=7.2; 6=9.8; 7=>9.8) -(CHF; HTN; vasc disease DM,  Male = 1; Age <65 =0; 65-74 = 1,  >75 =2; stroke/embolism= 2).    No orders of the defined types were placed in this encounter.   Medications Discontinued During This Encounter  Medication Reason   Ferrous Sulfate 27 MG TABS       Recommendations:   ShPHI AVANSis [place] [age]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  Hence has been on chronic anticoagulation.  He also has history of left carotid TCAR on 08/01/2020.   He was seen 6 weeks ago for recurrence of syncope again felt to be vasovagal, prior to this in June 2022, Dec 2022 he has had another episode similarly.  He  underwent extended EKG monitoring.  1. Vasovagal syncope Patient symptoms of syncope and near syncope, are preceded by premonitory symptoms all suggesting vasovagal episodes.  Counterpressure maneuvers discussed with the patient.  I have also discussed with him that he needs to be careful especially if he has abdominal discomfort and if he stands up this could lead to worsening of the vasovagal episodes.  Do not think he needs a loop recorder implantation.  Reviewed the Zio patch monitoring.  2. Atypical atrial flutter (HCPalmerHe is maintaining sinus rhythm mostly, he is presently on anticoagulation and is tolerating this well.  Continue the same.  3. Primary hypertension He has marked fluctuation in blood pressure, also has whitecoat hypertension.  Blood pressure at home has  been under excellent control and he did not make any changes to his medications.  Overall stable from cardiac standpoint, aortic stenosis/aortic regurgitation murmur appears to be stable as well, no clinical evidence of heart failure.  I will see him back in 6 months for follow-up.  He has had TCAR in the left carotid artery, he has a very prominent bruit, he needs follow-up carotid artery duplex and he will contact [facility].    Adrian Prows, MD, [facility] 07/26/2022, 2:55 PM Office: [phone] Fax: [phone] Pager: [phone]

## 2022-07-30 ENCOUNTER — Ambulatory Visit: Payer: Medicare Other | Admitting: Cardiology

## 2022-08-01 ENCOUNTER — Encounter (HOSPITAL_BASED_OUTPATIENT_CLINIC_OR_DEPARTMENT_OTHER): Payer: Self-pay | Admitting: Physical Therapy

## 2022-08-01 ENCOUNTER — Ambulatory Visit (HOSPITAL_BASED_OUTPATIENT_CLINIC_OR_DEPARTMENT_OTHER): Payer: Medicare Other | Admitting: Physical Therapy

## 2022-08-01 ENCOUNTER — Other Ambulatory Visit: Payer: Self-pay | Admitting: *Deleted

## 2022-08-01 DIAGNOSIS — M25612 Stiffness of left shoulder, not elsewhere classified: Secondary | ICD-10-CM | POA: Diagnosis not present

## 2022-08-01 DIAGNOSIS — R2689 Other abnormalities of gait and mobility: Secondary | ICD-10-CM | POA: Diagnosis not present

## 2022-08-01 DIAGNOSIS — I6522 Occlusion and stenosis of left carotid artery: Secondary | ICD-10-CM

## 2022-08-01 DIAGNOSIS — M6281 Muscle weakness (generalized): Secondary | ICD-10-CM

## 2022-08-01 NOTE — Therapy (Signed)
OUTPATIENT PHYSICAL THERAPY LOWER EXTREMITY TREATMENT   Patient Name: Dalton Cardenas MRN: [id number] DOB:04-13-1934, 88 y.o., male Today's Date: 08/01/2022   PT End of Session - 08/01/22 1522     Visit Number 5    Number of Visits 18    Date for PT Re-Evaluation 09/11/22    Authorization Type [employer] MCR    PT Start Time 5956    PT Stop Time 1556    PT Time Calculation (min) 40 min    Activity Tolerance Patient tolerated treatment well    Behavior During Therapy [facility] for tasks assessed/performed             Past Medical History:  Diagnosis Date   Coronary artery disease    60-70% circumflex (cath in [place] 08/2010).   Dysrhythmia    A history of A flutter and tachycardia (SVT)   GERD (gastroesophageal reflux disease)    Hypercholesterolemia    Hypertension    Prostate cancer ([place])    status post prostatectomy   Sleep apnea    Stenosis of subclavian artery ([place]) 06/30/2020    Severe stenosis of the proximal right subclavian artery    SVT (supraventricular tachycardia)    a. Holter monitoring 2011 - AV node reentry, multifocal atrial tachycardia, and resting bradycardia. b. previously tx with Abana in [place].   Past Surgical History:  Procedure Laterality Date   ABLATION OF DYSRHYTHMIC FOCUS  10/13/2014   SVT         by Dr Jacquelyne Balint FISSURE REPAIR     CARDIOVERSION N/A 07/01/2020   Procedure: CARDIOVERSION;  Surgeon: Adrian Prows, MD;  Location: [place];  Service: Cardiovascular;  Laterality: N/A;   ELECTROPHYSIOLOGY STUDY N/A 10/13/2014   Procedure: ELECTROPHYSIOLOGY STUDY;  Surgeon: Evans Lance, MD;  Location[facility] CATH LAB;  Service: Cardiovascular;  Laterality: N/A;   EYE SURGERY Bilateral 2011   cataract    PROSTATECTOMY     SUPRAVENTRICULAR TACHYCARDIA ABLATION N/A 10/13/2014   Procedure: SUPRAVENTRICULAR TACHYCARDIA ABLATION;  Surgeon: Evans Lance, MD;  [facility] C[facility] LAB;  Service: Cardiovascular;  Laterality: N/A;   TRANSCAROTID ARTERY  REVASCULARIZATION (TCAR) Left 08/01/2020   TRANSCAROTID ARTERY REVASCULARIZATION  Left 08/01/2020   Procedure: LEFT TRANSCAROTID ARTERY REVASCULARIZATION;  Surgeon: Elam Dutch, MD;  Location: [place];  Service: Vascular;  Laterality: Left;   ULTRASOUND GUIDANCE FOR VASCULAR ACCESS Right 08/01/2020   Procedure: ULTRASOUND GUIDANCE FOR VASCULAR ACCESS;  Surgeon: Elam Dutch, MD;  Location[facility];  Service: Vascular;  Laterality: Right;   Patient Active Problem List   Diagnosis Date Noted   Presence of internal carotid stent (TCAR Left 2021) 02/07/2022   Primary osteoarthritis of left knee 05/01/2021   DVT (deep venous thrombosis) ([place]) 11/24/2020   Carotid stenosis 08/01/2020   Hyponatremia 06/30/2020   Atrial flutter ([place]) 06/30/2020   Chronic cough 11/26/2019   Neck pain 07/02/2018   SVT (supraventricular tachycardia) 10/13/2014   Syncope 07/22/2012   Coronary artery disease    CHEST PAIN UNSPECIFIED 04/13/2010   ADENOCARCINOMA, PROSTATE 12/12/2009   HYPERCHOLESTEROLEMIA 12/12/2009   Obstructive sleep apnea 12/12/2009   Essential hypertension, benign 12/12/2009   Supraventricular tachycardia 12/12/2009    PCP: Aretta Nip, MD  REFERRING PROVIDER: Leeroy Cha, MD  REFERRING DIAG: R26.9 (ICD-10-CM) - Unspecified abnormalities of gait and mobility  THERAPY DIAG:  Other abnormalities of gait and mobility  Muscle weakness (generalized)  Rationale for Evaluation and Treatment Rehabilitation  ONSET DATE: 11/2021  SUBJECTIVE:  SUBJECTIVE STATEMENT:  Pt states he was a little sore after last session into the legs. L>R. He feels the hips and knee are stiff today but no pain.   Eval: Pt states that around 6 months ago his balance was deteriorating. He feels that he is more unstable than before. He will fall towards one direction with walking. Pt denies dizziness. Pt had an episode of syncope over the weekend- may be blood sugar issues but was a hot  day and he did not eat much. Pt states that he feels most wobbly when first waking up and getting going. Pt is still currently driving. No vision issues. Pt's pain is no associated with LOB. Pt walks for exercise usually. Does not go to the gym anymore. Denies 5D's 3N's.   Pt states the L shoulder also feels very stiff. 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 He was told that the ligaments in that shoulder are likely damaged. He did not have specific MOI.  PERTINENT HISTORY: CAD, prostate cancer, syncope, SVT  PAIN:  Are you having pain? No: NPRS scale: 0/10 Pain location: R groin Pain description: aching   PRECAUTIONS: None  WEIGHT BEARING RESTRICTIONS No  FALLS:  Has patient fallen in last 6 months? Yes. 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    OBJECTIVE:   DIAGNOSTIC FINDINGS: N/A  PATIENT SURVEYS:  FOTO 57 66pts @ DC 5 pts MCII   TODAY'S TREATMENT:   11/15  Nu step L4 6.5 min warm up  Seated figure 4 stretch 30s 3x Flexion lumbar stretch with stool 10s 6x Tandeom on foam 30s 3x each way NBOS foam EC balance 30s 3x STS 5x5 with3 lbs weight focus on RFD/power Standing GTB row in tandem 2x10  Verbal instruction on AAROM L shoulder flexion   PATIENT EDUCATION:  Education details:  anatomy, balance systems, exercise progression, muscle firing,  envelope of function, HEP, POC  Person educated: Patient Education method: Explanation, Demonstration, and Handouts Education comprehension: verbalized understanding and returned demonstration     HOME EXERCISE PROGRAM:  Access Code: [email or web address] URL: [email or web address] Date:  06/13/2022 Prepared by: Daleen Bo   ASSESSMENT:   CLINICAL IMPRESSION: Patient presents today with increased lower extremity stiffness especially at the knees and bilateral hips.  Patient able to begin session with longer dynamic warm up and static stretching in order to prevent knee and hip pain with standing exercise.  Patient was then able to continue with exercise today with increase in volume and intensity.  Patient able to progress to pliant surface balance training at today's session.  However, patient does have more medial lateral and anterior posterior instability on Airex surface.  Patient was given verbal instruction on shoulder exercises patient remarks that left shoulder is also limiting ability to perform ADLs.  Plan to continue to continue with general lower extremity strengthening, dynamic balance, and functional capacity at future sessions.  Pt would benefit from continued skilled therapy in order to reach goals and maximize functional LE strength and stability for prevention of future falls.       OBJECTIVE IMPAIRMENTS decreased balance, decreased coordination, decreased endurance, decreased knowledge of use of DME, decreased mobility, difficulty walking, decreased  strength, decreased safety awareness, impaired sensation, impaired UE functional use, improper body mechanics, postural dysfunction, obesity, and pain.    ACTIVITY LIMITATIONS carrying, lifting, squatting, stairs, transfers, reach over head, and locomotion level   PARTICIPATION LIMITATIONS: cleaning, laundry, community activity, and yard work   PERSONAL FACTORS Education, Fitness, Past/current experiences, Time since onset of injury/illness/exacerbation, and 1-2 comorbidities are also affecting patient's functional outcome.    REHAB POTENTIAL: Good   CLINICAL DECISION MAKING: Stable/uncomplicated   EVALUATION COMPLEXITY: Low     GOALS:    SHORT TERM GOALS: Target date: 09/12/2022   Pt will become independent  with HEP in order to demonstrate synthesis of PT education.  Goal status: met   2.  Pt will score at least 5 pt increase on FOTO to demonstrate functional improvement in MCII and pt perceived function.    Goal status: INITIAL      LONG TERM GOALS: Target date: 10/24/2022     Pt  will become independent with final HEP in order to demonstrate synthesis of PT education.  Goal status: ongoing   2.   Pt will have an at least 45/56 on Berg Balance scale in order to demonstrate improvement above cut off score for risk of falls.   Goal status: ongoing   3.   Pt will be able to demonstrate gait speed of 1 m/s or greater in order to demonstrate functional improvement in LE function for prevention of falls.  Goal status: ongoing   4.   Pt will score >/= 66 on FOTO to demonstrate improvement in perceived LE/balance function.  Goal status: INITIAL         PLAN: PT FREQUENCY: 1x/week   PT DURATION: 12 weeks (likely DC in 8)   PLANNED INTERVENTIONS: Therapeutic exercises, Therapeutic activity, Neuromuscular re-education, Balance training, Gait training, Patient/Family education, Joint mobilization, Stair training, DME instructions, Aquatic Therapy, Dry Needling, Electrical stimulation, Cryotherapy, Moist heat, Taping, Ultrasound, Ionotophoresis 39m/ml Dexamethasone, Manual therapy, and Re-evaluation   PLAN FOR NEXT SESSION:  hip strength, frontal plane stability, SL stability, NBOS balance with reaching  ADaleen Bo PT 08/01/2022, 4:01 PM

## 2022-08-02 ENCOUNTER — Ambulatory Visit (HOSPITAL_COMMUNITY)
Admission: RE | Admit: 2022-08-02 | Discharge: 2022-08-02 | Disposition: A | Discharge status: Home / Self Care | Payer: Medicare Other | Source: Ambulatory Visit | Attending: Vascular Surgery | Admitting: Vascular Surgery

## 2022-08-02 ENCOUNTER — Ambulatory Visit: Payer: Medicare Other | Admitting: Physician Assistant

## 2022-08-02 VITALS — BP 136/68 | HR 59 | Temp 97.4°F | Resp 16 | Ht 67.0 in | Wt 159.0 lb

## 2022-08-02 DIAGNOSIS — I4892 Unspecified atrial flutter: Secondary | ICD-10-CM | POA: Diagnosis not present

## 2022-08-02 DIAGNOSIS — I6522 Occlusion and stenosis of left carotid artery: Secondary | ICD-10-CM | POA: Insufficient documentation

## 2022-08-02 DIAGNOSIS — K219 Gastro-esophageal reflux disease without esophagitis: Secondary | ICD-10-CM | POA: Diagnosis not present

## 2022-08-02 DIAGNOSIS — E7800 Pure hypercholesterolemia, unspecified: Secondary | ICD-10-CM | POA: Diagnosis not present

## 2022-08-02 DIAGNOSIS — I10 Essential (primary) hypertension: Secondary | ICD-10-CM | POA: Diagnosis not present

## 2022-08-02 NOTE — Progress Notes (Signed)
History of Present Illness:  Patient is a 87 y.o. year old male who presents for evaluation of carotid stenosis.  He was referred to [place] by Dr. Einar Gip  for asymptomatic > 80% left ICA stenosis.   The patient denies symptoms of TIA, amaurosis, or stroke.  S/P left TCAR by Dr. Oneida Alar 2021.    On his last f/u visit the right ICA was <39% stenosis.  The patient is currently on ASA and Eliquis 5 MG.  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  He is followed by Dr. Irene Limbo in Hematology.        Past Medical History:  Diagnosis Date   Coronary artery disease    60-70% circumflex (cath in [place] 08/2010).   Dysrhythmia    A history of A flutter and tachycardia (SVT)   GERD (gastroesophageal reflux disease)    Hypercholesterolemia    Hypertension    Prostate cancer ([place])    status post prostatectomy   Sleep apnea    Stenosis of subclavian artery ([place]) 06/30/2020    Severe stenosis of the proximal right subclavian artery    SVT (supraventricular tachycardia)    a. Holter monitoring 2011 - AV node reentry, multifocal atrial tachycardia, and resting bradycardia. b. previously tx with Abana in [place].    Past Surgical History:  Procedure Laterality Date   ABLATION OF DYSRHYTHMIC FOCUS  10/13/2014   SVT         by Dr Jacquelyne Balint FISSURE REPAIR     CARDIOVERSION N/A 07/01/2020   Procedure: CARDIOVERSION;  Surgeon: Adrian Prows, MD;  Location: [facility];  Service: Cardiovascular;  Laterality: N/A;   ELECTROPHYSIOLOGY STUDY N/A 10/13/2014   Procedure: ELECTROPHYSIOLOGY STUDY;  Surgeon: Evans Lance, MD;  Location: [facility] CATH LAB;  Service: Cardiovascular;  Laterality: N/A;   EYE SURGERY Bilateral 2011   cataract    PROSTATECTOMY     SUPRAVENTRICULAR TACHYCARDIA ABLATION N/A 10/13/2014   Procedure: SUPRAVENTRICULAR TACHYCARDIA ABLATION;  Surgeon: Evans Lance, MD;  Location: [facility] CATH LAB;  Service: Cardiovascular;  Laterality: N/A;    TRANSCAROTID ARTERY REVASCULARIZATION (TCAR) Left 08/01/2020   TRANSCAROTID ARTERY REVASCULARIZATION  Left 08/01/2020   Procedure: LEFT TRANSCAROTID ARTERY REVASCULARIZATION;  Surgeon: Elam Dutch, MD;  Location: [facility] OR;  Service: Vascular;  Laterality: Left;   ULTRASOUND GUIDANCE FOR VASCULAR ACCESS Right 08/01/2020   Procedure: ULTRASOUND GUIDANCE FOR VASCULAR ACCESS;  Surgeon: Elam Dutch, MD;  Location: [facility] OR;  Service: Vascular;  Laterality: Right;     Social History Social History   Tobacco Use   Smoking status: Never   Smokeless tobacco: Never  Vaping Use   Vaping Use: Never used  Substance Use Topics   Alcohol use: No   Drug use: No    Family History Family History  Problem Relation Age of Onset   Hypertension Mother    Heart attack Father 54       only HA   Heart disease Father    Myocarditis Father    Hypertension Sister    Ulcers Sister    Hypertension Sister    Hypertension Brother    Diabetes Neg Hx    Coronary artery disease Neg Hx     Allergies  Allergies  Allergen Reactions   Amoxicillin Diarrhea    GI upset: Bleeding diarrhea  Other reaction(s): GI Bleed   Ibuprofen Diarrhea    Bloody diarrhea  Penicillins Diarrhea    Bloody diarrhea    Ace Inhibitors Swelling    Face swelling    Amoxicillin-Pot Clavulanate Other (See Comments)   Doxycycline Hyclate Other (See Comments)   Spironolactone     Hyponatremia    Atorvastatin     Weakness    Azithromycin     Stomach ache    Diltiazem Nausea Only    Upset stomach per patient   Hydrochlorothiazide Other (See Comments)    Photosensitivity     Current Outpatient Medications  Medication Sig Dispense Refill   acebutolol (SECTRAL) 200 MG capsule Take 1 capsule (200 mg total) by mouth 2 (two) times daily. 180 capsule 3   amLODipine (NORVASC) 5 MG tablet Take 1 tablet (5 mg total) by mouth daily. 90 tablet 3   apixaban (ELIQUIS) 2.5 MG TABS tablet Take 1 tablet (2.5 mg total)  by mouth 2 (two) times daily. 180 tablet 3   b complex vitamins tablet Take 1 tablet by mouth daily.      Cholecalciferol (VITAMIN D) 125 MCG (5000 UT) CAPS Take 5,000 Units by mouth daily.      Coenzyme Q10 (CO Q-10) 200 MG CAPS Take 200 mg by mouth daily.      Cyanocobalamin (B-12) 5000 MCG CAPS Take 5,000 mcg by mouth daily.     folic acid (FOLVITE) 937 MCG tablet Take 800 mcg by mouth daily.     hydrALAZINE (APRESOLINE) 100 MG tablet Take 1 (100 mg) tablet by mouth every morning and evening. Take 1 (50 mg) tablet daily in the afternoon. 180 tablet 3   hydrALAZINE (APRESOLINE) 50 MG tablet Take 1 (100 mg) tablet by mouth every morning and evening. Take 1 (50 mg) tablet daily in the afternoon. 90 tablet 3   L-Arginine 1000 MG TABS Take 1,000 mg by mouth daily.      LUTEIN PO Take 1 tablet by mouth daily.      Magnesium 250 MG TABS Take 500 mg by mouth daily.      melatonin 5 MG TABS Take 1 tablet by mouth daily as needed.     Probiotic Product (ALIGN PO) Take 1 capsule by mouth daily.      rosuvastatin (CRESTOR) 10 MG tablet Take 1 tablet (10 mg total) by mouth daily. 90 tablet 3   valsartan (DIOVAN) 160 MG tablet Take 1 tablet (160 mg total) by mouth daily. 90 tablet 3   Vitamin A 2400 MCG (8000 UT) CAPS Take 8,000 Units by mouth daily.     vitamin C (ASCORBIC ACID) 500 MG tablet Take 500 mg by mouth daily.     zinc gluconate 50 MG tablet Take 50 mg by mouth daily.     No current facility-administered medications for this visit.    ROS:   General:  No weight loss, Fever, chills  HEENT: No recent headaches, no nasal bleeding, no visual changes, no sore throat  Neurologic: No dizziness, blackouts, seizures. No recent symptoms of stroke or mini- stroke. No recent episodes of slurred speech, or temporary blindness.  Cardiac: No recent episodes of chest pain/pressure, no shortness of breath at rest.  No shortness of breath with exertion.  Denies history of atrial fibrillation or irregular  heartbeat  Vascular: No history of rest pain in feet.  No history of claudication.  No history of non-healing ulcer, positive history of DVT   Pulmonary: No home oxygen, no productive cough, no hemoptysis,  No asthma or wheezing  Musculoskeletal:  '[ ]'$  Arthritis, '[ ]'$   Low back pain,  '[ ]'$  Joint pain  Hematologic:No history of hypercoagulable state.  No history of easy bleeding.  No history of anemia  Gastrointestinal: No hematochezia or melena,  No gastroesophageal reflux, no trouble swallowing  Urinary: '[ ]'$  chronic Kidney disease, '[ ]'$  on HD - '[ ]'$  MWF or '[ ]'$  TTHS, '[ ]'$  Burning with urination, '[ ]'$  Frequent urination, '[ ]'$  Difficulty urinating;   Skin: No rashes  Psychological: No history of anxiety,  No history of depression   Physical Examination  Vitals:   08/02/22 0827 08/02/22 0831  BP: (!) 154/68 136/68  Pulse: (!) 59 (!) 59  Resp: 16   Temp: (!) 97.4 F (36.3 C)   TempSrc: Temporal   SpO2: 99%   Weight: 159 lb (72.1 kg)   Height: '5\' 7"'$  (1.702 m)     Body mass index is 24.9 kg/m.  General:  Alert and oriented, no acute distress HEENT: Normal Neck: No bruit or JVD Pulmonary: Clear to auscultation bilaterally Cardiac: Regular Rate and Rhythm with known  murmur Gastrointestinal: Soft, non-tender, non-distended, no mass, no scars Skin: No rash Extremity Pulses:  2+ radial pulses bilaterally Musculoskeletal: No deformity or edema, weakness B shoulders  Neurologic: Upper and lower extremity motor intact and grossly symmetric  DATA:  Right Carotid Findings:  +----------+--------+--------+--------+------------------+--------+           PSV cm/sEDV cm/sStenosisPlaque DescriptionComments  +----------+--------+--------+--------+------------------+--------+  CCA Prox  117     12                                tortuous  +----------+--------+--------+--------+------------------+--------+  CCA Mid   35      14                                           +----------+--------+--------+--------+------------------+--------+  CCA Distal80      17              heterogenous                +----------+--------+--------+--------+------------------+--------+  ICA Prox  126     24      1-39%                               +----------+--------+--------+--------+------------------+--------+  ICA Mid   68      18                                          +----------+--------+--------+--------+------------------+--------+  ICA Distal92      21                                tortuous  +----------+--------+--------+--------+------------------+--------+  ECA      140     4                                           +----------+--------+--------+--------+------------------+--------+   +----------+--------+-------+--------+-------------------+           PSV cm/sEDV cmsDescribeArm Pressure (mmHG)  +----------+--------+-------+--------+-------------------+  [phone]           Stenotic139                  +----------+--------+-------+--------+-------------------+   +---------+--------+--------+----------+  VertebralPSV cm/sEDV cm/sRetrograde  +---------+--------+--------+----------+      Left Carotid Findings:  +----------+--------+--------+--------+------------------+--------+           PSV cm/sEDV cm/sStenosisPlaque DescriptionComments  +----------+--------+--------+--------+------------------+--------+  CCA Prox  75      14                                          +----------+--------+--------+--------+------------------+--------+  CCA Mid   86      22                                          +----------+--------+--------+--------+------------------+--------+  CCA Distal85      21                                stent     +----------+--------+--------+--------+------------------+--------+  ICA Prox                                            stent      +----------+--------+--------+--------+------------------+--------+  ICA Mid                                             stenbt    +----------+--------+--------+--------+------------------+--------+  ICA Distal143     31                                          +----------+--------+--------+--------+------------------+--------+  ECA      326     23      >50%                                +----------+--------+--------+--------+------------------+--------+   +----------+--------+--------+----------------+-------------------+           PSV cm/sEDV cm/sDescribe        Arm Pressure (mmHG)  +----------+--------+--------+----------------+-------------------+  Subclavian207    9       Multiphasic, FIE332                  +----------+--------+--------+----------------+-------------------+   +---------+--------+--+--------+--+---------+  VertebralPSV cm/s86EDV cm/s18Antegrade  +---------+--------+--+--------+--+---------+   Right Brachial 83 cm/s monophasic waveform, left Brachial 135 cm/s  triphasic waveform   Left Stent(s):  +---------------+---+--++++  Prox to Stent  80 21  +---------------+---+--++++  Proximal Stent 89 22  +---------------+---+--++++  Mid Stent      10932  +---------------+---+--++++  Distal Stent   13328  +---------------+---+--++++  Distal to [id number]  +---------------+---+--++++       Summary:  Right Carotid: Velocities in the right ICA are consistent with a 1-39%  stenosis.   Left Carotid: Patent stent with no evidence for restenosis. The ECA  appears >50%  stenosed.   Vertebrals:  Left vertebral artery demonstrates antegrade flow. Right  vertebral              artery demonstrates retrograde flow (Probable right  subclavian              steal)..  Subclavians: Right subclavian artery was stenotic. Normal flow  hemodynamics were               seen in the left  subclavian artery.    ASSESSMENT/PLAN: Asymptomatic carotid stenosis s/p left TCAR The duplex shows patent left TCAR stent and < 39% on the right ICA He remains asymptomatic for stroke and TIA symptoms.   If he develops symptoms of stroke/TIA he will call 911.   F/U in 1 year for repeat surveillance .    He is on Eliquis for history of DVT.  He is now followed by [facility] for his hemarthrosis and arthrodesis.      Roxy Horseman PA-C [facility] Office: [phone]  MD in clinic [facility]

## 2022-08-07 ENCOUNTER — Ambulatory Visit (HOSPITAL_BASED_OUTPATIENT_CLINIC_OR_DEPARTMENT_OTHER): Payer: Medicare Other | Admitting: Physical Therapy

## 2022-08-07 ENCOUNTER — Encounter (HOSPITAL_BASED_OUTPATIENT_CLINIC_OR_DEPARTMENT_OTHER): Payer: Self-pay | Admitting: Physical Therapy

## 2022-08-07 DIAGNOSIS — M25612 Stiffness of left shoulder, not elsewhere classified: Secondary | ICD-10-CM

## 2022-08-07 DIAGNOSIS — R2689 Other abnormalities of gait and mobility: Secondary | ICD-10-CM | POA: Diagnosis not present

## 2022-08-07 DIAGNOSIS — M6281 Muscle weakness (generalized): Secondary | ICD-10-CM

## 2022-08-07 NOTE — Therapy (Signed)
OUTPATIENT PHYSICAL THERAPY LOWER EXTREMITY TREATMENT   Patient Name: Dalton Cardenas MRN: [id number] DOB:06/03/1934, 88 y.o., male Today's Date: 08/07/2022   PT End of Session - 08/07/22 1350     Visit Number 6    Number of Visits 18    Date for PT Re-Evaluation 09/11/22    Authorization Type [employer] MCR    PT Start Time 5638    PT Stop Time 1425    PT Time Calculation (min) 38 min    Activity Tolerance Patient tolerated treatment well    Behavior During Therapy [facility] for tasks assessed/performed             Past Medical History:  Diagnosis Date   Coronary artery disease    60-70% circumflex (cath in [place] 08/2010).   Dysrhythmia    A history of A flutter and tachycardia (SVT)   GERD (gastroesophageal reflux disease)    Hypercholesterolemia    Hypertension    Prostate cancer ([place])    status post prostatectomy   Sleep apnea    Stenosis of subclavian artery ([place]) 06/30/2020    Severe stenosis of the proximal right subclavian artery    SVT (supraventricular tachycardia)    a. Holter monitoring 2011 - AV node reentry, multifocal atrial tachycardia, and resting bradycardia. b. previously tx with Abana in [place].   Past Surgical History:  Procedure Laterality Date   ABLATION OF DYSRHYTHMIC FOCUS  10/13/2014   SVT         by Dr Jacquelyne Balint FISSURE REPAIR     CARDIOVERSION N/A 07/01/2020   Procedure: CARDIOVERSION;  Surgeon: Adrian Prows, MD;  Location: [place];  Service: Cardiovascular;  Laterality: N/A;   ELECTROPHYSIOLOGY STUDY N/A 10/13/2014   Procedure: ELECTROPHYSIOLOGY STUDY;  Surgeon: Evans Lance, MD;  Location: [facility] CATH LAB;  Service: Cardiovascular;  Laterality: N/A;   EYE SURGERY Bilateral 2011   cataract    PROSTATECTOMY     SUPRAVENTRICULAR TACHYCARDIA ABLATION N/A 10/13/2014   Procedure: SUPRAVENTRICULAR TACHYCARDIA ABLATION;  Surgeon: Evans Lance, MD;  Location: [facility] CATH LAB;  Service: Cardiovascular;  Laterality: N/A;   TRANSCAROTID ARTERY  REVASCULARIZATION (TCAR) Left 08/01/2020   TRANSCAROTID ARTERY REVASCULARIZATION  Left 08/01/2020   Procedure: LEFT TRANSCAROTID ARTERY REVASCULARIZATION;  Surgeon: Elam Dutch, MD;  Location: [place];  Service: Vascular;  Laterality: Left;   ULTRASOUND GUIDANCE FOR VASCULAR ACCESS Right 08/01/2020   Procedure: ULTRASOUND GUIDANCE FOR VASCULAR ACCESS;  Surgeon: Elam Dutch, MD;  Location: [facility] OR;  Service: Vascular;  Laterality: Right;   Patient Active Problem List   Diagnosis Date Noted   Presence of internal carotid stent (TCAR Left 2021) 02/07/2022   Primary osteoarthritis of left knee 05/01/2021   DVT (deep venous thrombosis) ([place]) 11/24/2020   Carotid stenosis 08/01/2020   Hyponatremia 06/30/2020   Atrial flutter ([place]) 06/30/2020   Chronic cough 11/26/2019   Neck pain 07/02/2018   SVT (supraventricular tachycardia) 10/13/2014   Syncope 07/22/2012   Coronary artery disease    CHEST PAIN UNSPECIFIED 04/13/2010   ADENOCARCINOMA, PROSTATE 12/12/2009   HYPERCHOLESTEROLEMIA 12/12/2009   Obstructive sleep apnea 12/12/2009   Essential hypertension, benign 12/12/2009   Supraventricular tachycardia 12/12/2009    PCP: Aretta Nip, MD  REFERRING PROVIDER: Leeroy Cha, MD  REFERRING DIAG: R26.9 (ICD-10-CM) - Unspecified abnormalities of gait and mobility  THERAPY DIAG:  Other abnormalities of gait and mobility  Stiffness of left shoulder, not elsewhere classified  Muscle weakness (generalized)  Rationale for Evaluation and  Treatment Rehabilitation  ONSET DATE: 11/2021  SUBJECTIVE:   SUBJECTIVE STATEMENT:  Pt states that he felt good after last session. He had some more R sided knee discomfort but the L was fine. Pt states he missed a step at night a few days ago. Denies injury.  Eval: Pt states that around 6 months ago his balance was deteriorating. He feels that he is more unstable than before. He will fall towards one direction with walking. Pt  denies dizziness. Pt had an episode of syncope over the weekend- may be blood sugar issues but was a hot day and he did not eat much. Pt states that he feels most wobbly when first waking up and getting going. Pt is still currently driving. No vision issues. Pt's pain is no associated with LOB. Pt walks for exercise usually. Does not go to the gym anymore. Denies 5D's 3N's.   Pt states the L shoulder also feels very stiff. 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 He was told that the ligaments in that shoulder are likely damaged. He did not have specific MOI.  PERTINENT HISTORY: CAD, prostate cancer, syncope, SVT, L shoulder RC injury  PAIN:  Are you having pain? No: NPRS scale: 0/10 Pain location: R groin Pain description: aching   PRECAUTIONS: None  WEIGHT BEARING RESTRICTIONS No  FALLS:  Has patient fallen in last 6 months? Yes. 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    OBJECTIVE:   DIAGNOSTIC FINDINGS: N/A  PATIENT SURVEYS:  FOTO 61 66pts @ DC 5 pts MCII   TODAY'S TREATMENT:  11/121  Nu step L5 6 min warm up  SL shuttle leg press 3x8 31lbs  Seated figure 4 stretch 30s 3x Tandeom on foam 30s 4x each way NBOS on foam with double UE reach and cog challenge 6x rounds NBOS foam EC balance 30s 3x STS 5x5 with3 lbs weight focus on RFD/power SL quad stretch 20s 3x each side     11/15  Nu step L4 6.5 min warm up  Seated figure 4 stretch 30s 3x Flexion lumbar stretch with stool 10s 6x Tandeom on foam 30s 3x each way NBOS foam EC balance 30s 3x STS 5x5 with3 lbs weight focus on RFD/power Standing GTB row in tandem 2x10  Verbal instruction on  AAROM L shoulder flexion   PATIENT EDUCATION:  Education details:  anatomy, balance systems, exercise progression, muscle firing,  envelope of function, HEP, POC  Person educated: Patient Education method: Explanation, Demonstration, and Handouts Education comprehension: verbalized understanding and returned demonstration     HOME EXERCISE PROGRAM:  Access Code: [email or web address] URL: [email or web address] Date: 06/13/2022 Prepared by: Daleen Bo   ASSESSMENT:   CLINICAL IMPRESSION: Pt able to continue with functional strengthening of the LE at today's session. However, pt was mildly limited by R knee pain. Appears related to potential strain with catching himself at the bottom step. No signs of injury noted with gait or functional balance, No increase in pain with exercise today. Pt was able to incorporate more weight shifting type exercise with dynamic balance without LOB. Pt does continue to have most difficulty with power based movements and slow eccentrics. Plan to continue with LE  strength and dynamic balance as able. Pt would benefit from continued skilled therapy in order to reach goals and maximize functional LE strength and stability for prevention of future falls.       OBJECTIVE IMPAIRMENTS decreased balance, decreased coordination, decreased endurance, decreased knowledge of use of DME, decreased mobility, difficulty walking, decreased strength, decreased safety awareness, impaired sensation, impaired UE functional use, improper body mechanics, postural dysfunction, obesity, and pain.    ACTIVITY LIMITATIONS carrying, lifting, squatting, stairs, transfers, reach over head, and locomotion level   PARTICIPATION LIMITATIONS: cleaning, laundry, community activity, and yard work   PERSONAL FACTORS Education, Fitness, Past/current experiences, Time since onset of injury/illness/exacerbation, and 1-2 comorbidities are also affecting patient's functional outcome.    REHAB  POTENTIAL: Good   CLINICAL DECISION MAKING: Stable/uncomplicated   EVALUATION COMPLEXITY: Low     GOALS:    SHORT TERM GOALS: Target date: 09/18/2022   Pt will become independent with HEP in order to demonstrate synthesis of PT education.  Goal status: met   2.  Pt will score at least 5 pt increase on FOTO to demonstrate functional improvement in MCII and pt perceived function.    Goal status: INITIAL      LONG TERM GOALS: Target date: 10/30/2022     Pt  will become independent with final HEP in order to demonstrate synthesis of PT education.  Goal status: ongoing   2.   Pt will have an at least 45/56 on Berg Balance scale in order to demonstrate improvement above cut off score for risk of falls.   Goal status: ongoing   3.   Pt will be able to demonstrate gait speed of 1 m/s or greater in order to demonstrate functional improvement in LE function for prevention of falls.  Goal status: ongoing   4.   Pt will score >/= 66 on FOTO to demonstrate improvement in perceived LE/balance function.  Goal status: INITIAL         PLAN: PT FREQUENCY: 1x/week   PT DURATION: 12 weeks (likely DC in 8)   PLANNED INTERVENTIONS: Therapeutic exercises, Therapeutic activity, Neuromuscular re-education, Balance training, Gait training, Patient/Family education, Joint mobilization, Stair training, DME instructions, Aquatic Therapy, Dry Needling, Electrical stimulation, Cryotherapy, Moist heat, Taping, Ultrasound, Ionotophoresis 20m/ml Dexamethasone, Manual therapy, and Re-evaluation   PLAN FOR NEXT SESSION:  hip strength, frontal plane stability, SL stability, pliant surface training, reactive stepping  ADaleen Bo PT 08/07/2022, 2:34 PM

## 2022-08-14 ENCOUNTER — Encounter (HOSPITAL_BASED_OUTPATIENT_CLINIC_OR_DEPARTMENT_OTHER): Payer: Medicare Other | Admitting: Physical Therapy

## 2022-08-14 ENCOUNTER — Encounter (HOSPITAL_BASED_OUTPATIENT_CLINIC_OR_DEPARTMENT_OTHER): Payer: Self-pay | Admitting: Physical Therapy

## 2022-08-15 ENCOUNTER — Ambulatory Visit (HOSPITAL_BASED_OUTPATIENT_CLINIC_OR_DEPARTMENT_OTHER): Payer: Medicare Other | Admitting: Physical Therapy

## 2022-08-15 DIAGNOSIS — M6281 Muscle weakness (generalized): Secondary | ICD-10-CM | POA: Diagnosis not present

## 2022-08-15 DIAGNOSIS — R2689 Other abnormalities of gait and mobility: Secondary | ICD-10-CM | POA: Diagnosis not present

## 2022-08-15 DIAGNOSIS — M25612 Stiffness of left shoulder, not elsewhere classified: Secondary | ICD-10-CM | POA: Diagnosis not present

## 2022-08-15 NOTE — Therapy (Signed)
OUTPATIENT PHYSICAL THERAPY LOWER EXTREMITY TREATMENT   Patient Name: REDFORD SALSER MRN: [id number] DOB:11/08/1933, 88 y.o., male Today's Date: 08/16/2022   PT End of Session - 08/15/22 1532     Visit Number 7    Number of Visits 18    Date for PT Re-Evaluation 09/11/22    Authorization Type U[employer] MCR    PT Start Time 1523    PT Stop Time 1601    PT Time Calculation (min) 38 min    Activity Tolerance Patient tolerated treatment well    Behavior During Therapy [facility] for tasks assessed/performed              Past Medical History:  Diagnosis Date   Coronary artery disease    60-70% circumflex (cath in [place] 08/2010).   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 Holter monitoring 2011 - AV node reentry, multifocal atrial tachycardia, and resting bradycardia. b. previously tx with Abana in [place].   Past Surgical History:  Procedure Laterality Date   ABLATION OF DYSRHYTHMIC FOCUS  10/13/2014   SVT         by Dr Laqueta Due FISSURE REPAIR     CARDIOVERSION N/A 07/01/2020   Procedure: CARDIOVERSION;  Surgeon: Yates Decamp, MD;  Location: [facility] ENDOSCOPY;  Service: Cardiovascular;  Laterality: N/A;   ELECTROPHYSIOLOGY STUDY N/A 10/13/2014   Procedure: ELECTROPHYSIOLOGY STUDY;  Surgeon: Marinus Maw, MD;  Location: [facility] CATH LAB;  Service: Cardiovascular;  Laterality: N/A;   EYE SURGERY Bilateral 2011   cataract    PROSTATECTOMY     SUPRAVENTRICULAR TACHYCARDIA ABLATION N/A 10/13/2014   Procedure: SUPRAVENTRICULAR TACHYCARDIA ABLATION;  Surgeon: Marinus Maw, MD;  Location: [facility] CATH LAB;  Service: Cardiovascular;  Laterality: N/A;   TRANSCAROTID ARTERY  REVASCULARIZATION (TCAR) Left 08/01/2020   TRANSCAROTID ARTERY REVASCULARIZATION  Left 08/01/2020   Procedure: LEFT TRANSCAROTID ARTERY REVASCULARIZATION;  Surgeon: Sherren Kerns, MD;  Location: [facility] OR;  Service: Vascular;  Laterality: Left;   ULTRASOUND GUIDANCE FOR VASCULAR ACCESS Right 08/01/2020   Procedure: ULTRASOUND GUIDANCE FOR VASCULAR ACCESS;  Surgeon: Sherren Kerns, MD;  Location: [facility] OR;  Service: Vascular;  Laterality: Right;   Patient Active Problem List   Diagnosis Date Noted   Presence of internal carotid stent (TCAR Left 2021) 02/07/2022   Primary osteoarthritis of left knee 05/01/2021   DVT (deep venous thrombosis) (HCC) 11/24/2020   Carotid stenosis 08/01/2020   Hyponatremia 06/30/2020   Atrial flutter (HCC) 06/30/2020   Chronic cough 11/26/2019   Neck pain 07/02/2018   SVT (supraventricular tachycardia) 10/13/2014   Syncope 07/22/2012   Coronary artery disease    CHEST PAIN UNSPECIFIED 04/13/2010   ADENOCARCINOMA, PROSTATE 12/12/2009   HYPERCHOLESTEROLEMIA 12/12/2009   Obstructive sleep apnea 12/12/2009   Essential hypertension, benign 12/12/2009   Supraventricular tachycardia 12/12/2009    PCP: Clayborn Heron, MD  REFERRING PROVIDER: Lorenda Ishihara, MD  REFERRING DIAG: R26.9 (ICD-10-CM) - Unspecified abnormalities of gait and mobility  THERAPY DIAG:  Other abnormalities of gait and mobility  Muscle weakness (generalized)  Rationale for Evaluation and Treatment Rehabilitation  ONSET DATE: 11/2021  SUBJECTIVE:   SUBJECTIVE STATEMENT:  Pt states his L hip feels stiff.  Pt states he felt fine after prior Rx.  Pt states he has improved in balance some.  Pt states he is better at times and then not at other times.  Pt denies any falls.  Pt states he has not been as consistent with HEP and performs home exercises once every 2 days.     Eval: Pt states that around 6 months ago his balance was deteriorating. He feels that he is more  unstable than before. He will fall towards one direction with walking. Pt denies dizziness. Pt had an episode of syncope over the weekend- may be blood sugar issues but was a hot day and he did not eat much. Pt states that he feels most wobbly when first waking up and getting going. Pt is still currently driving. No vision issues. Pt's pain is no associated with LOB. Pt walks for exercise usually. Does not go to the gym anymore. Denies 5D's 3N's.   Pt states the L shoulder also feels very stiff. 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 He was told that the ligaments in that shoulder are likely damaged. He did not have specific MOI.  PERTINENT HISTORY: CAD, prostate cancer, syncope, SVT, L shoulder RC injury, A-fib with hx of ablation, and Hx of DVT in 2022  PAIN:  Are you having pain? No: NPRS scale: 3/10 Pain location: L hip Pain description: aching   PRECAUTIONS: None  WEIGHT BEARING RESTRICTIONS No  FALLS:  Has patient fallen in last 6 months? Yes. 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    OBJECTIVE:   DIAGNOSTIC FINDINGS: N/A    TODAY'S TREATMENT:  Reviewed response to prior Rx, pain level, current function, and HEP compliance. Pt performed:  Nu step L4 5 min warm up SL shuttle leg press 3x8 31lbs  Reviewed seated figure 4 stretch STS 5x5 with 3 lbs weight  Tandem on foam 30s 2x each way with frequent UE support and min assist  Tandem on floor 2x30 sec bilat with occasional UE assist. NBOS on foam EC 3x30 sec  NBOS foam EC balance 30s 2x with min/mod assist for balance      PATIENT EDUCATION:  Education details:  anatomy, balance systems,  exercise progression, muscle firing,  envelope of function, HEP, POC Person educated: Patient Education method: Explanation, Demonstration, and Handouts Education comprehension: verbalized understanding and returned demonstration     HOME EXERCISE PROGRAM:  Access Code: [email or web address] URL: [email or web address] Date: 06/13/2022 Prepared by: Zebedee Iba   ASSESSMENT:   CLINICAL IMPRESSION: Pt reports having stiffness and some pain in L hip.  PT reviewed the seated hip stretch which he reports having pain with the stretch.  Pt is limited with flexibility with stretch.  PT instructed pt to not perform stretch at this time due to pain.  Pt had increased sway and LOB with balance exercises on airex today which pt states is due to the cushioning of his new shoes.  Pt did report some R knee discomfort with STS.  PT increased the height of the table which improved his discomfort.  Pt responded well to rx stating  his hip felt better after Rx having reduced pain.  Pt should benefit from cont skilled PT services to address ongoing goals and to improve stability for prevention of future falls.       OBJECTIVE IMPAIRMENTS decreased balance, decreased coordination, decreased endurance, decreased knowledge of use of DME, decreased mobility, difficulty walking, decreased strength, decreased safety awareness, impaired sensation, impaired UE functional use, improper body mechanics, postural dysfunction, obesity, and pain.    ACTIVITY LIMITATIONS carrying, lifting, squatting, stairs, transfers, reach over head, and locomotion level   PARTICIPATION LIMITATIONS: cleaning, laundry, community activity, and yard work   PERSONAL FACTORS Education, Fitness, Past/current experiences, Time since onset of injury/illness/exacerbation, and 1-2 comorbidities are also affecting patient's functional outcome.    REHAB POTENTIAL: Good   CLINICAL DECISION MAKING: Stable/uncomplicated   EVALUATION COMPLEXITY: Low      GOALS:    SHORT TERM GOALS: Target date: 09/27/2022   Pt will become independent with HEP in order to demonstrate synthesis of PT education.  Goal status: met   2.  Pt will score at least 5 pt increase on FOTO to demonstrate functional improvement in MCII and pt perceived function.    Goal status: INITIAL      LONG TERM GOALS: Target date: 11/08/2022     Pt  will become independent with final HEP in order to demonstrate synthesis of PT education.  Goal status: ongoing   2.   Pt will have an at least 45/56 on Berg Balance scale in order to demonstrate improvement above cut off score for risk of falls.   Goal status: ongoing   3.   Pt will be able to demonstrate gait speed of 1 m/s or greater in order to demonstrate functional improvement in LE function for prevention of falls.  Goal status: ongoing   4.   Pt will score >/= 66 on FOTO to demonstrate improvement in perceived LE/balance function.  Goal status: INITIAL         PLAN: PT FREQUENCY: 1x/week   PT DURATION: 12 weeks (likely DC in 8)   PLANNED INTERVENTIONS: Therapeutic exercises, Therapeutic activity, Neuromuscular re-education, Balance training, Gait training, Patient/Family education, Joint mobilization, Stair training, DME instructions, Aquatic Therapy, Dry Needling, Electrical stimulation, Cryotherapy, Moist heat, Taping, Ultrasound, Ionotophoresis 4mg /ml Dexamethasone, Manual therapy, and Re-evaluation   PLAN FOR NEXT SESSION:  hip strength, frontal plane stability, SL stability, pliant surface training, reactive stepping  Audie Clear III PT, DPT 08/16/22 10:33 PM

## 2022-08-16 ENCOUNTER — Encounter (HOSPITAL_BASED_OUTPATIENT_CLINIC_OR_DEPARTMENT_OTHER): Payer: Self-pay | Admitting: Physical Therapy

## 2022-08-22 ENCOUNTER — Ambulatory Visit (HOSPITAL_BASED_OUTPATIENT_CLINIC_OR_DEPARTMENT_OTHER): Payer: Medicare Other | Attending: Internal Medicine | Admitting: Physical Therapy

## 2022-08-22 DIAGNOSIS — R2689 Other abnormalities of gait and mobility: Secondary | ICD-10-CM

## 2022-08-22 DIAGNOSIS — I10 Essential (primary) hypertension: Secondary | ICD-10-CM | POA: Diagnosis not present

## 2022-08-22 DIAGNOSIS — M6281 Muscle weakness (generalized): Secondary | ICD-10-CM | POA: Diagnosis not present

## 2022-08-22 NOTE — Therapy (Signed)
OUTPATIENT PHYSICAL THERAPY LOWER EXTREMITY TREATMENT   Patient Name: Dalton Cardenas MRN: [id number] DOB:[birth year], 88 y.o., male Today's Date: 08/23/2022   PT End of Session - 08/22/22 1530     Visit Number 8    Number of Visits 18    Date for PT Re-Evaluation 09/11/22    Authorization Type UHC MCR    PT Start Time 1526    PT Stop Time 1607    PT Time Calculation (min) 41 min    Activity Tolerance Patient tolerated treatment well    Behavior During Therapy [facility] for tasks assessed/performed              Past Medical History:  Diagnosis Date   Coronary artery disease    60-70% circumflex (cath in [place] 08/2010).   Dysrhythmia    A history of A flutter and tachycardia (SVT)   GERD (gastroesophageal reflux disease)    Hypercholesterolemia    Hypertension    Prostate cancer ([place])    status post prostatectomy   Sleep apnea    Stenosis of subclavian artery ([place]) 06/30/2020    Severe stenosis of the proximal right subclavian artery    SVT (supraventricular tachycardia)    a. Holter monitoring 2011 - AV node reentry, multifocal atrial tachycardia, and resting bradycardia. b. previously tx with Abana in [place].   Past Surgical History:  Procedure Laterality Date   ABLATION OF DYSRHYTHMIC FOCUS  10/13/2014   SVT         by Dr Jacquelyne Balint FISSURE REPAIR     CARDIOVERSION N/A 07/01/2020   Procedure: CARDIOVERSION;  Surgeon: Adrian Prows, MD;  Location: [place];  Service: Cardiovascular;  Laterality: N/A;   ELECTROPHYSIOLOGY STUDY N/A 10/13/2014   Procedure: ELECTROPHYSIOLOGY STUDY;  Surgeon: Evans Lance, MD;  Location: [facility] CATH LAB;  Service: Cardiovascular;  Laterality: N/A;   EYE SURGERY Bilateral 2011   cataract    PROSTATECTOMY     SUPRAVENTRICULAR TACHYCARDIA ABLATION N/A 10/13/2014   Procedure: SUPRAVENTRICULAR TACHYCARDIA ABLATION;  Surgeon: Evans Lance, MD;  Location: [facility] CATH LAB;  Service: Cardiovascular;  Laterality: N/A;   TRANSCAROTID ARTERY  REVASCULARIZATION (TCAR) Left 08/01/2020   TRANSCAROTID ARTERY REVASCULARIZATION  Left 08/01/2020   Procedure: LEFT TRANSCAROTID ARTERY REVASCULARIZATION;  Surgeon: Elam Dutch, MD;  Location: [place];  Service: Vascular;  Laterality: Left;   ULTRASOUND GUIDANCE FOR VASCULAR ACCESS Right 08/01/2020   Procedure: ULTRASOUND GUIDANCE FOR VASCULAR ACCESS;  Surgeon: Elam Dutch, MD;  Location: [facility] OR;  Service: Vascular;  Laterality: Right;   Patient Active Problem List   Diagnosis Date Noted   Presence of internal carotid stent (TCAR Left 2021) 02/07/2022   Primary osteoarthritis of left knee 05/01/2021   DVT (deep venous thrombosis) ([place]) 11/24/2020   Carotid stenosis 08/01/2020   Hyponatremia 06/30/2020   Atrial flutter ([place]) 06/30/2020   Chronic cough 11/26/2019   Neck pain 07/02/2018   SVT (supraventricular tachycardia) 10/13/2014   Syncope 07/22/2012   Coronary artery disease    CHEST PAIN UNSPECIFIED 04/13/2010   ADENOCARCINOMA, PROSTATE 12/12/2009   HYPERCHOLESTEROLEMIA 12/12/2009   Obstructive sleep apnea 12/12/2009   Essential hypertension, benign 12/12/2009   Supraventricular tachycardia 12/12/2009    PCP: Aretta Nip, MD  REFERRING PROVIDER: Leeroy Cha, MD  REFERRING DIAG: R26.9 (ICD-10-CM) - Unspecified abnormalities of gait and mobility  THERAPY DIAG:  Other abnormalities of gait and mobility  Muscle weakness (generalized)  Rationale for Evaluation and Treatment Rehabilitation  ONSET DATE: 11/2021  SUBJECTIVE:   SUBJECTIVE STATEMENT:  Pt states his L hip feels stiff, but is improving.  Pt states he felt fine after prior Rx.  Pt states he has improved in balance some.  Pt denies any falls.  Pt states he has been performing his exercises more consistently about every other day.      Eval: Pt states that around 6 months ago his balance was deteriorating. He feels that he is more unstable than before. He will fall towards one  direction with walking. Pt denies dizziness. Pt had an episode of syncope over the weekend- may be blood sugar issues but was a hot day and he did not eat much. Pt states that he feels most wobbly when first waking up and getting going. Pt is still currently driving. No vision issues. Pt's pain is no associated with LOB. Pt walks for exercise usually. Does not go to the gym anymore. Denies 5D's 3N's.   Pt states the L shoulder also feels very stiff. 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 He was told that the ligaments in that shoulder are likely damaged. He did not have specific MOI.  PERTINENT HISTORY: CAD, prostate cancer, syncope, SVT, L shoulder RC injury, A-fib with hx of ablation, and Hx of DVT in 2022  PAIN:  Are you having pain? No: NPRS scale: 0/10 Pain location: L hip Pain description: aching   PRECAUTIONS: None  WEIGHT BEARING RESTRICTIONS No  FALLS:  Has patient fallen in last 6 months? Yes. 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    OBJECTIVE:   DIAGNOSTIC FINDINGS: N/A    TODAY'S TREATMENT:  Reviewed response to prior Rx, pain level, current function, and HEP compliance. Pt performed:  Nustep L4 5 min warm up SL shuttle leg press 3x8 31lbs  STS 5x5 with 3 lbs weight  Lateral band walks with RTB above knees x 3 reps Tandem on foam 30s 2x each way with frequent UE support, PT provided CGA and occasionally min assist Tandem on floor x30 sec bilat with occasional UE assist with R LE back NBOS on foam EC 3x30 sec Lateral stepping over 4 hurdles Supine manual piriformis stretch 2x30 sec bilat     PATIENT EDUCATION:  Education details:  PT  answered Pt's questions.  PT provided education concerning relevant anatomy, exercise progression, exercise form, HEP, and POC. Person educated: Patient Education method: Explanation, Demonstration, verbal cues Education comprehension: verbalized understanding, verbal cues required, and returned demonstration     HOME EXERCISE PROGRAM:  Access Code: [email or web address] URL: [email or web address] Date: 06/13/2022 Prepared by: Daleen Bo   ASSESSMENT:   CLINICAL IMPRESSION: Pt continues to have stiffness in L hip though is improving.  He has not been performing the figure four stretch, but has been more compliant with HEP.  Pt had improved balance with neuro re-ed activities today though still has deficits with tandem stance which is worse with R LE being back.  Pt performed exercises well with cuing for correct form and positioning.  He responded well to rx having no pain and no c/o's   after Rx.  Pt should benefit from cont skilled PT services to address ongoing goals and to improve  stability for prevention of future falls.      OBJECTIVE IMPAIRMENTS decreased balance, decreased coordination, decreased endurance, decreased knowledge of use of DME, decreased mobility, difficulty walking, decreased strength, decreased safety awareness, impaired sensation, impaired UE functional use, improper body mechanics, postural dysfunction, obesity, and pain.    ACTIVITY LIMITATIONS carrying, lifting, squatting, stairs, transfers, reach over head, and locomotion level   PARTICIPATION LIMITATIONS: cleaning, laundry, community activity, and yard work   PERSONAL FACTORS Education, Fitness, Past/current experiences, Time since onset of injury/illness/exacerbation, and 1-2 comorbidities are also affecting patient's functional outcome.    REHAB POTENTIAL: Good   CLINICAL DECISION MAKING: Stable/uncomplicated   EVALUATION COMPLEXITY: Low     GOALS:    SHORT TERM GOALS: Target date:  10/04/2022   Pt will become independent with HEP in order to demonstrate synthesis of PT education.  Goal status: met   2.  Pt will score at least 5 pt increase on FOTO to demonstrate functional improvement in MCII and pt perceived function.    Goal status: INITIAL      LONG TERM GOALS: Target date: 11/15/2022     Pt  will become independent with final HEP in order to demonstrate synthesis of PT education.  Goal status: ongoing   2.   Pt will have an at least 45/56 on Berg Balance scale in order to demonstrate improvement above cut off score for risk of falls.   Goal status: ongoing   3.   Pt will be able to demonstrate gait speed of 1 m/s or greater in order to demonstrate functional improvement in LE function for prevention of falls.  Goal status: ongoing   4.   Pt will score >/= 66 on FOTO to demonstrate improvement in perceived LE/balance function.  Goal status: INITIAL         PLAN: PT FREQUENCY: 1x/week   PT DURATION: 12 weeks (likely DC in 8)   PLANNED INTERVENTIONS: Therapeutic exercises, Therapeutic activity, Neuromuscular re-education, Balance training, Gait training, Patient/Family education, Joint mobilization, Stair training, DME instructions, Aquatic Therapy, Dry Needling, Electrical stimulation, Cryotherapy, Moist heat, Taping, Ultrasound, Ionotophoresis 56m/ml Dexamethasone, Manual therapy, and Re-evaluation   PLAN FOR NEXT SESSION:  hip strength, frontal plane stability, SL stability, pliant surface training, reactive stepping  RSelinda MichaelsIII PT, DPT 08/23/22 5:37 PM

## 2022-08-23 ENCOUNTER — Encounter (HOSPITAL_BASED_OUTPATIENT_CLINIC_OR_DEPARTMENT_OTHER): Payer: Self-pay | Admitting: Physical Therapy

## 2022-08-29 ENCOUNTER — Ambulatory Visit (HOSPITAL_BASED_OUTPATIENT_CLINIC_OR_DEPARTMENT_OTHER): Payer: Medicare Other | Admitting: Physical Therapy

## 2022-08-29 DIAGNOSIS — R2689 Other abnormalities of gait and mobility: Secondary | ICD-10-CM

## 2022-08-29 DIAGNOSIS — M6281 Muscle weakness (generalized): Secondary | ICD-10-CM | POA: Diagnosis not present

## 2022-08-29 NOTE — Therapy (Signed)
OUTPATIENT PHYSICAL THERAPY LOWER EXTREMITY TREATMENT   Patient Name: Dalton Cardenas MRN: [id number] DOB:1934-04-25, 88 y.o., male Today's Date: 08/30/2022   PT End of Session - 08/29/22 1611     Visit Number 9    Number of Visits 18    Date for PT Re-Evaluation 09/11/22    Authorization Type [employer] MCR    PT Start Time 1519    PT Stop Time 1558    PT Time Calculation (min) 39 min    Activity Tolerance Patient tolerated treatment well    Behavior During Therapy [facility] for tasks assessed/performed               Past Medical History:  Diagnosis Date   Coronary artery disease    60-70% circumflex (cath in [place] 08/2010).   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 Holter monitoring 2011 - AV node reentry, multifocal atrial tachycardia, and resting bradycardia. b. previously tx with [facility] in [place].   Past Surgical History:  Procedure Laterality Date   ABLATION OF DYSRHYTHMIC FOCUS  10/13/2014   SVT         by Dr Jacquelyne Balint FISSURE REPAIR     CARDIOVERSION N/A 07/01/2020   Procedure: CARDIOVERSION;  Surgeon: Adrian Prows, MD;  Location: [place];  Service: Cardiovascular;  Laterality: N/A;   ELECTROPHYSIOLOGY STUDY N/A 10/13/2014   Procedure: ELECTROPHYSIOLOGY STUDY;  Surgeon: Evans Lance, MD;  Location: [facility] CATH LAB;  Service: Cardiovascular;  Laterality: N/A;   EYE SURGERY Bilateral 2011   cataract    PROSTATECTOMY     SUPRAVENTRICULAR TACHYCARDIA ABLATION N/A 10/13/2014   Procedure: SUPRAVENTRICULAR TACHYCARDIA ABLATION;  Surgeon: Evans Lance, MD;  Location: [facility] CATH LAB;  Service: Cardiovascular;  Laterality: N/A;   TRANSCAROTID ARTERY  REVASCULARIZATION (TCAR) Left 08/01/2020   TRANSCAROTID ARTERY REVASCULARIZATION  Left 08/01/2020   Procedure: LEFT TRANSCAROTID ARTERY REVASCULARIZATION;  Surgeon: Elam Dutch, MD;  Location: [place];  Service: Vascular;  Laterality: Left;   ULTRASOUND GUIDANCE FOR VASCULAR ACCESS Right 08/01/2020   Procedure: ULTRASOUND GUIDANCE FOR VASCULAR ACCESS;  Surgeon: Elam Dutch, MD;  Location: [facility] OR;  Service: Vascular;  Laterality: Right;   Patient Active Problem List   Diagnosis Date Noted   Presence of internal carotid stent (TCAR Left 2021) 02/07/2022   Primary osteoarthritis of left knee 05/01/2021   DVT (deep venous thrombosis) ([place]) 11/24/2020   Carotid stenosis 08/01/2020   Hyponatremia 06/30/2020   Atrial flutter ([place]) 06/30/2020   Chronic cough 11/26/2019   Neck pain 07/02/2018   SVT (supraventricular tachycardia) 10/13/2014   Syncope 07/22/2012   Coronary artery disease    CHEST PAIN UNSPECIFIED 04/13/2010   ADENOCARCINOMA, PROSTATE 12/12/2009   HYPERCHOLESTEROLEMIA 12/12/2009   Obstructive sleep apnea 12/12/2009   Essential hypertension, benign 12/12/2009   Supraventricular tachycardia 12/12/2009    PCP: Aretta Nip, MD  REFERRING PROVIDER: Leeroy Cha, MD  REFERRING DIAG: R26.9 (ICD-10-CM) - Unspecified abnormalities of gait and mobility  THERAPY DIAG:  Other abnormalities of gait and mobility  Muscle weakness (generalized)  Rationale for Evaluation and Treatment Rehabilitation  ONSET DATE: 11/2021  SUBJECTIVE:   SUBJECTIVE STATEMENT:  Pt states his L hip is ok.  Pt has less stiffness in hip.  He did not add in that hip stretch yet.   Pt states he feels bloated today though is not having any palpitations.  Pt states he felt fine after prior Rx.  Pt states he has improved in balance some.  He still has difficulty with tandem stance.  Pt denies any falls.  Pt states he has been performing his exercises about every other day.       Eval: Pt states that around 6 months ago his balance was deteriorating. He feels that he is more unstable than before. He will fall towards one direction with walking. Pt denies dizziness. Pt had an episode of syncope over the weekend- may be blood sugar issues but was a hot day and he did not eat much. Pt states that he feels most wobbly when first waking up and getting going. Pt is still currently driving. No vision issues. Pt's pain is no associated with LOB. Pt walks for exercise usually. Does not go to the gym anymore. Denies 5D's 3N's.   Pt states the L shoulder also feels very stiff. 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 He was told that the ligaments in that shoulder are likely damaged. He did not have specific MOI.  PERTINENT HISTORY: CAD, prostate cancer, syncope, SVT, L shoulder RC injury, A-fib with hx of ablation, and Hx of DVT in 2022  PAIN:  Are you having pain? No: NPRS scale: 0/10 Pain location: L hip Pain description: aching   PRECAUTIONS: None  WEIGHT BEARING RESTRICTIONS No  FALLS:  Has patient fallen in last 6 months? Yes. 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    OBJECTIVE:   DIAGNOSTIC FINDINGS: N/A    TODAY'S TREATMENT:  At beginning of Rx: O2:  99% HR:  60  At end of Rx:   O2:  99% HR:  78-80 which improved to 69-71 with sitting   Reviewed response to prior Rx, pain level, current function, and HEP compliance. Pt performed:  Nustep L4 5 min warm up SL shuttle leg press 3x8 31lbs  STS 5x5 with 3 lbs weight  Lateral band walks with RTB above knees x 3 reps Tandem on floor 2x30 sec bilat with occasional UE assist with R  >L LE back NBOS on foam EC 2x30 sec Ambulating with step to and reciprocal gait over 4 hurdles  Lateral stepping over 4 hurdles-2 occasions of LOB with min assist from PT and UE support Seated figure 4 stretch 3x30 sec bilat     PATIENT EDUCATION:  Education details:  PT answered Pt's questions.  PT provided education concerning exercise progression, exercise form, HEP, and POC. Person educated: Patient Education method: Explanation, Demonstration, verbal cues Education comprehension: verbalized understanding, verbal cues required, and returned demonstration     HOME EXERCISE PROGRAM:  Access Code: [email or web address] URL: [email or web address] Date: 06/13/2022 Prepared by: Daleen Bo   ASSESSMENT:   CLINICAL IMPRESSION:    Pt states his left hip is feeling better and has less stiffness.  Pt performed exercises well with cuing for correct form and positioning.  He  did well with sit to stands with table elevated.  Pt continues to have deficits with tandem stance though pt states his balance has improved some.  Pt able to ambulate with a reciprocal gait over hurdles well without UE support.  Pt had 2 LOBs with lateral stepping over hurdles.  Pt presented to Rx today stating he felt bloated though had improved bloating feeling after Rx.  Pt had no increased pain after Rx.      OBJECTIVE IMPAIRMENTS decreased balance, decreased coordination, decreased endurance, decreased knowledge of use of DME, decreased mobility, difficulty walking, decreased strength, decreased safety awareness, impaired sensation, impaired UE functional use, improper body mechanics, postural dysfunction, obesity, and pain.    ACTIVITY LIMITATIONS carrying, lifting, squatting, stairs, transfers, reach over head, and locomotion level   PARTICIPATION LIMITATIONS: cleaning, laundry, community activity, and yard work   PERSONAL FACTORS Education, Fitness, Past/current experiences, Time since onset of  injury/illness/exacerbation, and 1-2 comorbidities are also affecting patient's functional outcome.    REHAB POTENTIAL: Good   CLINICAL DECISION MAKING: Stable/uncomplicated   EVALUATION COMPLEXITY: Low     GOALS:    SHORT TERM GOALS: Target date: 10/11/2022   Pt will become independent with HEP in order to demonstrate synthesis of PT education.  Goal status: met   2.  Pt will score at least 5 pt increase on FOTO to demonstrate functional improvement in MCII and pt perceived function.    Goal status: INITIAL      LONG TERM GOALS: Target date: 11/22/2022     Pt  will become independent with final HEP in order to demonstrate synthesis of PT education.  Goal status: ongoing   2.   Pt will have an at least 45/56 on Berg Balance scale in order to demonstrate improvement above cut off score for risk of falls.   Goal status: ongoing   3.   Pt will be able to demonstrate gait speed of 1 m/s or greater in order to demonstrate functional improvement in LE function for prevention of falls.  Goal status: ongoing   4.   Pt will score >/= 66 on FOTO to demonstrate improvement in perceived LE/balance function.  Goal status: INITIAL         PLAN: PT FREQUENCY: 1x/week   PT DURATION: 12 weeks (likely DC in 8)   PLANNED INTERVENTIONS: Therapeutic exercises, Therapeutic activity, Neuromuscular re-education, Balance training, Gait training, Patient/Family education, Joint mobilization, Stair training, DME instructions, Aquatic Therapy, Dry Needling, Electrical stimulation, Cryotherapy, Moist heat, Taping, Ultrasound, Ionotophoresis 59m/ml Dexamethasone, Manual therapy, and Re-evaluation   PLAN FOR NEXT SESSION:  hip strength, frontal plane stability, SL stability, pliant surface training, reactive stepping.  PN vs discharge next visit.   RSelinda MichaelsIII PT, DPT 08/30/22 11:41 PM

## 2022-08-30 ENCOUNTER — Encounter (HOSPITAL_BASED_OUTPATIENT_CLINIC_OR_DEPARTMENT_OTHER): Payer: Self-pay | Admitting: Physical Therapy

## 2022-09-03 ENCOUNTER — Encounter: Payer: Self-pay | Admitting: Cardiology

## 2022-09-03 ENCOUNTER — Ambulatory Visit: Payer: Medicare Other | Admitting: Cardiology

## 2022-09-03 ENCOUNTER — Telehealth: Payer: Self-pay

## 2022-09-03 VITALS — BP 125/52 | HR 71 | Ht 67.0 in | Wt 168.0 lb

## 2022-09-03 DIAGNOSIS — R0602 Shortness of breath: Secondary | ICD-10-CM | POA: Diagnosis not present

## 2022-09-03 DIAGNOSIS — R0789 Other chest pain: Secondary | ICD-10-CM | POA: Diagnosis not present

## 2022-09-03 DIAGNOSIS — I10 Essential (primary) hypertension: Secondary | ICD-10-CM

## 2022-09-03 DIAGNOSIS — I484 Atypical atrial flutter: Secondary | ICD-10-CM

## 2022-09-03 DIAGNOSIS — I5033 Acute on chronic diastolic (congestive) heart failure: Secondary | ICD-10-CM

## 2022-09-03 DIAGNOSIS — I480 Paroxysmal atrial fibrillation: Secondary | ICD-10-CM

## 2022-09-03 MED ORDER — POTASSIUM CHLORIDE ER 10 MEQ PO TBCR: 20.0000 meq | EXTENDED_RELEASE_TABLET | Freq: Every day | ORAL | 0 refills | Status: DC | PRN

## 2022-09-03 MED ORDER — FUROSEMIDE 40 MG PO TABS: 40.0000 mg | ORAL_TABLET | Freq: Every day | ORAL | 0 refills | Status: DC | PRN

## 2022-09-03 NOTE — Telephone Encounter (Signed)
Patient's home BP is controlled on current antihypertensive regimen.   Average Systolic BP Level 595.39 mmHg Lowest Systolic BP Level 97 mmHg Highest Systolic BP Level 672 mmHg  Average Diastolic BP Level 89.7 mmHg Lowest Diastolic BP Level 56 mmHg Highest Diastolic BP Level 86 mmHg  Average Pulse Level 56.73 BPM Lowest Pulse Level 46 BPM Highest Pulse Level 76 BPM  09/02/2022 'Sunday at 12:38 PM 128 / 70      09/02/2022 Sunday at 12:36 PM 129 / 68      09/02/2022 Sunday at 12:35 PM 134 / 68      09/01/2022 Saturday at 12:13 PM 116 / 59      09/01/2022 Saturday at 12:12 PM 119 / 58      09/01/2022 Saturday at 12:11 PM 123 / 69      08/31/2022 Friday at 11:26 AM 124 / 70      08/31/2022 Friday at 11:24 AM 124 / 70      12'$ /15/2023 Friday at 11:22 AM 124 / 70

## 2022-09-03 NOTE — H&P (View-Only) (Signed)
Primary Physician/Referring:  Leeroy Cha, MD  Patient ID: Dalton Cardenas, male    DOB: 08-19-1934, 88 y.o.   MRN: [id number]  Chief Complaint  Patient presents with   Loss of Consciousness   Follow-up   Shortness of Breath   HPI:    Dalton Cardenas  is a 88 y.o. 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  Hence has been on chronic anticoagulation.  He also has history of left carotid TCAR on 08/01/2020.    He has had vasovagal syncope about 2 to 3 months ago and has had no recurrence.  He called our office last night stating that he has been having chest pain, worsening dyspnea and I brought him in to be seen.  Over the past several days, he has noticed even doing minimal activities around the house he gets markedly short of breath.  He had developed constipation, thought that he was having abdominal distention and also lower part of the chest hurting and wanted to be evaluated.  He is also noticed worsening leg edema.  Past Medical History:  Diagnosis Date   Coronary artery disease    60-70% circumflex (cath in [place] 08/2010).   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 Holter monitoring 2011 - AV node reentry, multifocal atrial tachycardia, and resting bradycardia. b. previously tx with [facility] in [place].    Social History   Tobacco Use   Smoking status: Never   Smokeless tobacco: Never  Substance Use Topics   Alcohol use: No   ROS  Review of Systems  Constitutional:  Positive for malaise/fatigue.  Cardiovascular:  Positive for dyspnea on exertion, leg swelling and palpitations. Negative for chest pain.    Objective  Blood pressure (!) 125/52, pulse 71, height '5\' 7"'$  (1.702 m), weight 168 lb (76.2 kg), SpO2 96 %.     09/03/2022   12:17 PM 08/02/2022    8:31 AM 08/02/2022    8:27 AM  Vitals with BMI  Height '5\' 7"'$   '5\' 7"'$   Weight 168 lbs  159 lbs  BMI 85.02  77.4  Systolic 128 786 767  Diastolic 52 68 68  Pulse 71 59 59     Physical Exam Vitals reviewed.  Neck:     Vascular: JVD present. Carotid bruit: soft bilateral. Cardiovascular:     Rate and Rhythm: Bradycardia present. Rhythm irregular.     Pulses: Intact distal pulses.     Heart sounds: S1 normal and S2 normal. Murmur heard.     Harsh midsystolic murmur is present with a grade of 2/6 at the upper right sternal border.     Early diastolic murmur is present with a grade of 2/4 at the upper right sternal border radiating to the apex.     No gallop.  Pulmonary:     Effort: Pulmonary effort is normal. No respiratory distress.     Breath sounds: No wheezing, rhonchi or rales.  Abdominal:     General: Bowel sounds  are normal.     Palpations: Abdomen is soft.  Musculoskeletal:     Right lower leg: Edema (2 + pitting upto the thigh) present.     Left lower leg: Edema (1-2+ below knee) present.   Physical exam unchanged compared to previous office visit.  Laboratory examination:   Lab Results  Component Value Date   NA 135 03/15/2021   K 3.5 03/15/2021   CO2 24 03/15/2021   GLUCOSE 210 (H) 03/15/2021   BUN 17 03/15/2021   CREATININE 1.03 03/15/2021   CALCIUM 9.5 03/15/2021   GFRNONAA >60 03/15/2021    CrCl cannot be calculated (Patient's most recent lab result is older than the maximum 21 days allowed.).     Latest Ref Rng & Units 03/15/2021   12:23 AM 11/08/2020    2:17 PM 08/02/2020    5:00 AM  CMP  Glucose 70 - 99 mg/dL 210  102  98   BUN 8 - 23 mg/dL '17  20  13    '$ Creatinine 0.61 - 1.24 mg/dL 1.03  1.04  1.02   Sodium 135 - 145 mmol/L 135  136  132   Potassium 3.5 - 5.1 mmol/L 3.5  4.8  4.3   Chloride 98 - 111 mmol/L 102  104  104   CO2 22 - 32 mmol/L '24  24  22   '$ Calcium 8.9 - 10.3 mg/dL 9.5  9.4  7.7   Total Protein 6.5 - 8.1 g/dL  7.5    Total Bilirubin 0.3 - 1.2 mg/dL  0.9    Alkaline Phos 38 - 126 U/L  71    AST 15 - 41 U/L  23    ALT 0 - 44 U/L  21        Latest Ref Rng & Units 03/15/2021   12:23 AM 11/08/2020    2:17 PM 08/02/2020    5:00 AM  CBC  WBC 4.0 - 10.5 K/uL 10.8  8.2  9.0   Hemoglobin 13.0 - 17.0 g/dL 14.0  11.8  10.2   Hematocrit 39.0 - 52.0 % 41.0  36.1  30.1   Platelets 150 - 400 K/uL 243  267  220    External labs :   Labs 02/02/2021:  Total cholesterol 187, triglycerides 87, HDL 51, LDL 120. Allergies   Allergies  Allergen Reactions   Amoxicillin Diarrhea    GI upset: Bleeding diarrhea  Other reaction(s): GI Bleed   Ibuprofen Diarrhea    Bloody diarrhea    Penicillins Diarrhea    Bloody diarrhea    Ace Inhibitors Swelling    Face swelling    Amoxicillin-Pot Clavulanate Other (See Comments)   Doxycycline Hyclate Other (See Comments)   Spironolactone     Hyponatremia    Atorvastatin     Weakness    Azithromycin     Stomach ache    Diltiazem Nausea Only    Upset stomach per patient   Hydrochlorothiazide Other (See Comments)    Photosensitivity    Final Medications at End of Visit     Current Outpatient Medications:    acebutolol (SECTRAL) 200 MG capsule, Take 1 capsule (200 mg total) by mouth 2 (two) times daily., Disp: 180 capsule, Rfl: 3   amLODipine (NORVASC) 5 MG tablet, Take 1 tablet (5 mg total) by mouth daily., Disp: 90 tablet, Rfl: 3   apixaban (ELIQUIS) 2.5 MG TABS tablet, Take 1 tablet (2.5 mg total) by mouth 2 (two) times daily., Disp: 180 tablet, Rfl: 3  b complex vitamins tablet, Take 1 tablet by mouth daily. , Disp: , Rfl:    Cholecalciferol (VITAMIN D) 125 MCG (5000 UT) CAPS,  Take 5,000 Units by mouth daily. , Disp: , Rfl:    Coenzyme Q10 (CO Q-10) 200 MG CAPS, Take 200 mg by mouth daily. , Disp: , Rfl:    Cyanocobalamin (B-12) 5000 MCG CAPS, Take 5,000 mcg by mouth daily., Disp: , Rfl:    folic acid (FOLVITE) 109 MCG tablet, Take 800 mcg by mouth daily., Disp: , Rfl:    furosemide (LASIX) 40 MG tablet, Take 1 tablet (40 mg total) by mouth daily as needed. Weight gain > 3 Lbs in 3 days or shortness of breath, Disp: 30 tablet, Rfl: 0   hydrALAZINE (APRESOLINE) 100 MG tablet, Take 1 (100 mg) tablet by mouth every morning and evening. Take 1 (50 mg) tablet daily in the afternoon., Disp: 180 tablet, Rfl: 3   hydrALAZINE (APRESOLINE) 50 MG tablet, Take 1 (100 mg) tablet by mouth every morning and evening. Take 1 (50 mg) tablet daily in the afternoon., Disp: 90 tablet, Rfl: 3   L-Arginine 1000 MG TABS, Take 1,000 mg by mouth daily. , Disp: , Rfl:    LUTEIN PO, Take 1 tablet by mouth daily. , Disp: , Rfl:    Magnesium 250 MG TABS, Take 500 mg by mouth daily. , Disp: , Rfl:    melatonin 5 MG TABS, Take 1 tablet by mouth daily as needed., Disp: , Rfl:    Menaquinone-7 (K2 PO), Take by mouth daily at 6 (six) AM., Disp: , Rfl:    potassium chloride (KLOR-CON) 10 MEQ tablet, Take 2 tablets (20 mEq total) by mouth daily as needed. Take with Furosemide only, Disp: 60 tablet, Rfl: 0   Probiotic Product (ALIGN PO), Take 1 capsule by mouth daily. , Disp: , Rfl:    rosuvastatin (CRESTOR) 10 MG tablet, Take 1 tablet (10 mg total) by mouth daily., Disp: 90 tablet, Rfl: 3   valsartan (DIOVAN) 160 MG tablet, Take 1 tablet (160 mg total) by mouth daily., Disp: 90 tablet, Rfl: 3   Vitamin A 2400 MCG (8000 UT) CAPS, Take 8,000 Units by mouth daily., Disp: , Rfl:    vitamin C (ASCORBIC ACID) 500 MG tablet, Take 500 mg by mouth daily., Disp: , Rfl:    zinc gluconate 50 MG tablet, Take 50 mg by mouth daily., Disp: , Rfl:   Radiology:   Chest x-ray 06/30/2020: Lungs are clear.  No pleural  effusion or pneumothorax. The heart is normal in size.  Thoracic aortic atherosclerosis. Degenerative changes of the visualized thoracolumbar spine.  CT angiogram chest 11/11/2020: 1. No demonstrable pulmonary embolus. No thoracic aortic aneurysm or dissection. There are foci of aortic atherosclerosis as well as foci of great vessel and coronary artery calcification.   2. Mild atelectatic change in the left lower lobe and inferior lingula. No edema or airspace opacity. Multiple 1-2 mm nodular opacities noted in each upper lobe. No larger pulmonary nodular opacities evident. No follow-up needed if patient is low-risk (and has no known or suspected primary neoplasm). Non-contrast chest CT can be considered in 12 months if patient is high-risk.  Cardiac Studies:  Coronary Angiogram   [2011]: Performed in [place], 70% stenosis in the circumflex coronary artery. Was performed as a part of workup for EP study for the SVT.  CT angio head W or WO Contrast 07/12/2020:  No acute intracranial abnormality. Mild chronic microvascular ischemic changes. Chronic right  caudate infarct. Short segment marked stenosis of the distal intracranial right vertebral artery.  Carotid duplex 05/24/2021: Right Carotid: Velocities in the right ICA are consistent with a 1-39% stenosis. Non-hemodynamically significant plaque <50% noted in the CCA.  Left Carotid: There is no evidence of stenosis in the left ICA. Non-hemodynamically significant plaque <50% noted in the CCA. The ECA appears >50% stenosed. Patent left CCA/ ICA stent.  Vertebrals:  Bilateral vertebral arteries demonstrate antegrade flow.  Subclavians: Right subclavian artery was stenotic. Normal flow hemodynamics were seen in the left subclavian artery.   PCV ECHOCARDIOGRAM COMPLETE 02/23/2022  Normal LV systolic function with visual EF 60-65%. Left ventricle cavity is normal in size. Moderate to severe left ventricular hypertrophy, presence of a septal bulge.  Systolic motion of the anterior mitral valve leaflet (SAM).  Normal global wall motion. Unable to accurately evaluate diastolic function due to mitral annular calcification; however, doppler parameters suggestive of grade 2 diastolic dysfunction and elevated LAP. Aortic valve sclerosis without stenosis. Mild to moderate aortic regurgitation. Mild (Grade I) mitral regurgitation. Moderate calcification of the mitral valve annulus. Moderate tricuspid regurgitation. Mild pulmonary hypertension. RVSP measures 39 mmHg. Mild pulmonic regurgitation. Compared to 06/03/2020 Moderate AR is now mild/moderate, moderate MR is now mild, mild PHTN (RVSP 34mHG compared to before 365mG) / SAM / dilated coronary sinus are new findings.  [facility] Extended out patient EKG monitoring 13 days starting 07/03/2022:   Dominant rhythm Sinus.  First degree AV block was present. Ventricular bigeminy was present, longest episode 4.6 seconds. HR 39-92 bpm. Avg HR 55 bpm. No atrial fibrillation/atrial flutter/SVT/VT/high grade AV block, sinus pause >3sec noted. 14 runs of probable atrial tachycardia with fastest interval lasting 6 beats with a max heart rate of 113 bpm. Isolated SVE <1.0%, SVE Couplets <1.0%, SVE Triplets <1.0% Isolated VE <1%, VE Couplets <1.0%, VE Triplets <1.0% Symptoms reported: None  EKG:    EKG 09/03/2022: Atrial fibrillation with controlled ventricular response at the rate of 58 bpm, left axis deviation, left anterior fascicular block.  Incomplete right bundle branch block.  Poor R progression, probably normal variant.  Nonspecific T abnormality.  Compared to 06/19/2021, sinus with first-degree AV block has now been replaced by atrial fibrillation otherwise no change.  EKG 07/16/2022: Sinus bradycardia with first-degree AV block at rate of 57 bpm.  Left axis deviation.  Left anterior fascicular block.  LVH.  Compared to previous EKG on 02/07/2022 no significant change.   Assessment     ICD-10-CM    1. Paroxysmal atrial fibrillation (HCC)  I48.0     2. Atypical atrial flutter (HCC)  I48.4     3. Acute on chronic diastolic heart failure (HCC)  I5C58.52asic metabolic panel    CBC    Pro b natriuretic peptide (BNP)    Magnesium    furosemide (LASIX) 40 MG tablet    potassium chloride (KLOR-CON) 10 MEQ tablet    4. SOB (shortness of breath)  R06.02 EKG 12-Lead    5. Primary hypertension  I10       CHA2DS2-VASc Score is 4.  Yearly risk of stroke: 4.8% (A, HTN, Vasc Dz).  Score of 1=0.6; 2=2.2; 3=3.2; 4=4.8; 5=7.2; 6=9.8; 7=>9.8) -(CHF; HTN; vasc disease DM,  Male = 1; Age <65 =0; 65-74 = 1,  >75 =2; stroke/embolism= 2).    Meds ordered this encounter  Medications   furosemide (LASIX) 40 MG tablet    Sig: Take 1 tablet (40 mg total) by mouth daily as needed. Weight  gain > 3 Lbs in 3 days or shortness of breath    Dispense:  30 tablet    Refill:  0   potassium chloride (KLOR-CON) 10 MEQ tablet    Sig: Take 2 tablets (20 mEq total) by mouth daily as needed. Take with Furosemide only    Dispense:  60 tablet    Refill:  0   There are no discontinued medications.  Recommendations:   MARTISE WADDELL  is a 88 y.o. 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  Hence has been on chronic anticoagulation.  He also has history of left carotid TCAR on 08/01/2020.  He has had vasovagal syncope about 2 to 3 months ago and has had no recurrence.  He called our office last night stating that he has been having chest pain, worsening dyspnea and I brought him in to be seen.  1. Paroxysmal atrial fibrillation (HCC) Patient symptoms of worsening dyspnea, fatigue is related to recurrence of atrial fibrillation, previously has had atypical atrial flutter.  He is also in acute decompensated heart failure with elevated JVD,  bilateral leg edema right worse than the left and abdominal distention.  I will schedule him for direct-current cardioversion tomorrow. Schedule for Direct current cardioversion. I have discussed regarding risks benefits rate control vs rhythm control with the patient. Patient understands cardiac arrest and need for CPR, aspiration pneumonia, but not limited to these. Patient is willing.   2. Atypical atrial flutter (HCC) Prior history of atypical atrial flutter and he has had typical atrial flutter ablation in the remote past.  3. Acute on chronic diastolic heart failure (HCC) Presently in acute decompensated heart failure as evidenced by elevated JVD, abdominal distention, weight gain and leg edema.  I will start him on furosemide 40 mg once daily along with potassium 20 mEq daily on a as needed basis, clear-cut instructions given to the patient.  4. SOB (shortness of breath) Dyspnea is related to #1 and 3.  5. Primary hypertension Patient presently in RPM, blood pressure has been under excellent control.  Continue present medications.  I will see him back in 6 weeks for follow-up.  Note RPM: Patient's home BP is controlled on current antihypertensive regimen.    Average Systolic BP Level 662.94 mmHg Lowest Systolic BP Level 97 mmHg Highest Systolic BP Level 765 mmHg   Average Diastolic BP Level 46.5 mmHg Lowest Diastolic BP Level 56 mmHg Highest Diastolic BP Level 86 mmHg   Average Pulse Level 56.73 BPM Lowest Pulse Level 46 BPM Highest Pulse Level 76 BPM   09/02/2022 'Sunday at 12:38 PM        128 / 70                09/02/2022 Sunday at 12:36 PM        129 / 68                09/02/2022 Sunday at 12:35 PM        134 / 68                09/01/2022 Saturday at 12:13 PM      116 / 59                09/01/2022 Saturday at 12:12 PM      11'$ 9 / 58  09/01/2022 Saturday at 12:11 PM      123 / 69                08/31/2022 Friday at 11:26 AM          124 / 70                 08/31/2022 Friday at 11:24 AM          124 / 70                08/31/2022 Friday at [place] / 70   Adrian Prows, MD, [facility] 09/03/2022, 12:58 PM Office: [phone] Fax: [phone] Pager: [phone]

## 2022-09-03 NOTE — Progress Notes (Signed)
Primary Physician/Referring:  Leeroy Cha, MD  Patient ID: Dalton Cardenas, male    DOB: Jul 05, 1934, 88 y.o.   MRN: [id number]  Chief Complaint  Patient presents with   Loss of Consciousness   Follow-up   Shortness of Breath   HPI:    Dalton Cardenas  is a 88 y.o. 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  Hence has been on chronic anticoagulation.  He also has history of left carotid TCAR on 08/01/2020.    He has had vasovagal syncope about 2 to 3 months ago and has had no recurrence.  He called our office last night stating that he has been having chest pain, worsening dyspnea and I brought him in to be seen.  Over the past several days, he has noticed even doing minimal activities around the house he gets markedly short of breath.  He had developed constipation, thought that he was having abdominal distention and also lower part of the chest hurting and wanted to be evaluated.  He is also noticed worsening leg edema.  Past Medical History:  Diagnosis Date   Coronary artery disease    60-70% circumflex (cath in [place] 08/2010).   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 Holter monitoring 2011 - AV node reentry, multifocal atrial tachycardia, and resting bradycardia. b. previously tx with [facility] in [place].    Social History   Tobacco Use   Smoking status: Never   Smokeless tobacco: Never  Substance Use Topics   Alcohol use: No   ROS  Review of Systems  Constitutional:  Positive for malaise/fatigue.  Cardiovascular:  Positive for dyspnea on exertion, leg swelling and palpitations. Negative for chest pain.    Objective  Blood pressure (!) 125/52, pulse 71, height '5\' 7"'$  (1.702 m), weight 168 lb (76.2 kg), SpO2 96 %.     09/03/2022   12:17 PM 08/02/2022    8:31 AM 08/02/2022    8:27 AM  Vitals with BMI  Height '5\' 7"'$   '5\' 7"'$   Weight 168 lbs  159 lbs  BMI 83.41  96.2  Systolic 229 798 921  Diastolic 52 68 68  Pulse 71 59 59     Physical Exam Vitals reviewed.  Neck:     Vascular: JVD present. Carotid bruit: soft bilateral. Cardiovascular:     Rate and Rhythm: Bradycardia present. Rhythm irregular.     Pulses: Intact distal pulses.     Heart sounds: S1 normal and S2 normal. Murmur heard.     Harsh midsystolic murmur is present with a grade of 2/6 at the upper right sternal border.     Early diastolic murmur is present with a grade of 2/4 at the upper right sternal border radiating to the apex.     No gallop.  Pulmonary:     Effort: Pulmonary effort is normal. No respiratory distress.     Breath sounds: No wheezing, rhonchi or rales.  Abdominal:     General: Bowel sounds  are normal.     Palpations: Abdomen is soft.  Musculoskeletal:     Right lower leg: Edema (2 + pitting upto the thigh) present.     Left lower leg: Edema (1-2+ below knee) present.   Physical exam unchanged compared to previous office visit.  Laboratory examination:   Lab Results  Component Value Date   NA 135 03/15/2021   K 3.5 03/15/2021   CO2 24 03/15/2021   GLUCOSE 210 (H) 03/15/2021   BUN 17 03/15/2021   CREATININE 1.03 03/15/2021   CALCIUM 9.5 03/15/2021   GFRNONAA >60 03/15/2021    CrCl cannot be calculated (Patient's most recent lab result is older than the maximum 21 days allowed.).     Latest Ref Rng & Units 03/15/2021   12:23 AM 11/08/2020    2:17 PM 08/02/2020    5:00 AM  CMP  Glucose 70 - 99 mg/dL 210  102  98   BUN 8 - 23 mg/dL '17  20  13    '$ Creatinine 0.61 - 1.24 mg/dL 1.03  1.04  1.02   Sodium 135 - 145 mmol/L 135  136  132   Potassium 3.5 - 5.1 mmol/L 3.5  4.8  4.3   Chloride 98 - 111 mmol/L 102  104  104   CO2 22 - 32 mmol/L '24  24  22   '$ Calcium 8.9 - 10.3 mg/dL 9.5  9.4  7.7   Total Protein 6.5 - 8.1 g/dL  7.5    Total Bilirubin 0.3 - 1.2 mg/dL  0.9    Alkaline Phos 38 - 126 U/L  71    AST 15 - 41 U/L  23    ALT 0 - 44 U/L  21        Latest Ref Rng & Units 03/15/2021   12:23 AM 11/08/2020    2:17 PM 08/02/2020    5:00 AM  CBC  WBC 4.0 - 10.5 K/uL 10.8  8.2  9.0   Hemoglobin 13.0 - 17.0 g/dL 14.0  11.8  10.2   Hematocrit 39.0 - 52.0 % 41.0  36.1  30.1   Platelets 150 - 400 K/uL 243  267  220    External labs :   Labs 02/02/2021:  Total cholesterol 187, triglycerides 87, HDL 51, LDL 120. Allergies   Allergies  Allergen Reactions   Amoxicillin Diarrhea    GI upset: Bleeding diarrhea  Other reaction(s): GI Bleed   Ibuprofen Diarrhea    Bloody diarrhea    Penicillins Diarrhea    Bloody diarrhea    Ace Inhibitors Swelling    Face swelling    Amoxicillin-Pot Clavulanate Other (See Comments)   Doxycycline Hyclate Other (See Comments)   Spironolactone     Hyponatremia    Atorvastatin     Weakness    Azithromycin     Stomach ache    Diltiazem Nausea Only    Upset stomach per patient   Hydrochlorothiazide Other (See Comments)    Photosensitivity    Final Medications at End of Visit     Current Outpatient Medications:    acebutolol (SECTRAL) 200 MG capsule, Take 1 capsule (200 mg total) by mouth 2 (two) times daily., Disp: 180 capsule, Rfl: 3   amLODipine (NORVASC) 5 MG tablet, Take 1 tablet (5 mg total) by mouth daily., Disp: 90 tablet, Rfl: 3   apixaban (ELIQUIS) 2.5 MG TABS tablet, Take 1 tablet (2.5 mg total) by mouth 2 (two) times daily., Disp: 180 tablet, Rfl: 3  b complex vitamins tablet, Take 1 tablet by mouth daily. , Disp: , Rfl:    Cholecalciferol (VITAMIN D) 125 MCG (5000 UT) CAPS,  Take 5,000 Units by mouth daily. , Disp: , Rfl:    Coenzyme Q10 (CO Q-10) 200 MG CAPS, Take 200 mg by mouth daily. , Disp: , Rfl:    Cyanocobalamin (B-12) 5000 MCG CAPS, Take 5,000 mcg by mouth daily., Disp: , Rfl:    folic acid (FOLVITE) 161 MCG tablet, Take 800 mcg by mouth daily., Disp: , Rfl:    furosemide (LASIX) 40 MG tablet, Take 1 tablet (40 mg total) by mouth daily as needed. Weight gain > 3 Lbs in 3 days or shortness of breath, Disp: 30 tablet, Rfl: 0   hydrALAZINE (APRESOLINE) 100 MG tablet, Take 1 (100 mg) tablet by mouth every morning and evening. Take 1 (50 mg) tablet daily in the afternoon., Disp: 180 tablet, Rfl: 3   hydrALAZINE (APRESOLINE) 50 MG tablet, Take 1 (100 mg) tablet by mouth every morning and evening. Take 1 (50 mg) tablet daily in the afternoon., Disp: 90 tablet, Rfl: 3   L-Arginine 1000 MG TABS, Take 1,000 mg by mouth daily. , Disp: , Rfl:    LUTEIN PO, Take 1 tablet by mouth daily. , Disp: , Rfl:    Magnesium 250 MG TABS, Take 500 mg by mouth daily. , Disp: , Rfl:    melatonin 5 MG TABS, Take 1 tablet by mouth daily as needed., Disp: , Rfl:    Menaquinone-7 (K2 PO), Take by mouth daily at 6 (six) AM., Disp: , Rfl:    potassium chloride (KLOR-CON) 10 MEQ tablet, Take 2 tablets (20 mEq total) by mouth daily as needed. Take with Furosemide only, Disp: 60 tablet, Rfl: 0   Probiotic Product (ALIGN PO), Take 1 capsule by mouth daily. , Disp: , Rfl:    rosuvastatin (CRESTOR) 10 MG tablet, Take 1 tablet (10 mg total) by mouth daily., Disp: 90 tablet, Rfl: 3   valsartan (DIOVAN) 160 MG tablet, Take 1 tablet (160 mg total) by mouth daily., Disp: 90 tablet, Rfl: 3   Vitamin A 2400 MCG (8000 UT) CAPS, Take 8,000 Units by mouth daily., Disp: , Rfl:    vitamin C (ASCORBIC ACID) 500 MG tablet, Take 500 mg by mouth daily., Disp: , Rfl:    zinc gluconate 50 MG tablet, Take 50 mg by mouth daily., Disp: , Rfl:   Radiology:   Chest x-ray 06/30/2020: Lungs are clear.  No pleural  effusion or pneumothorax. The heart is normal in size.  Thoracic aortic atherosclerosis. Degenerative changes of the visualized thoracolumbar spine.  CT angiogram chest 11/11/2020: 1. No demonstrable pulmonary embolus. No thoracic aortic aneurysm or dissection. There are foci of aortic atherosclerosis as well as foci of great vessel and coronary artery calcification.   2. Mild atelectatic change in the left lower lobe and inferior lingula. No edema or airspace opacity. Multiple 1-2 mm nodular opacities noted in each upper lobe. No larger pulmonary nodular opacities evident. No follow-up needed if patient is low-risk (and has no known or suspected primary neoplasm). Non-contrast chest CT can be considered in 12 months if patient is high-risk.  Cardiac Studies:  Coronary Angiogram   [2011]: Performed in [place], 70% stenosis in the circumflex coronary artery. Was performed as a part of workup for EP study for the SVT.  CT angio head W or WO Contrast 07/12/2020:  No acute intracranial abnormality. Mild chronic microvascular ischemic changes. Chronic right  caudate infarct. Short segment marked stenosis of the distal intracranial right vertebral artery.  Carotid duplex 05/24/2021: Right Carotid: Velocities in the right ICA are consistent with a 1-39% stenosis. Non-hemodynamically significant plaque <50% noted in the CCA.  Left Carotid: There is no evidence of stenosis in the left ICA. Non-hemodynamically significant plaque <50% noted in the CCA. The ECA appears >50% stenosed. Patent left CCA/ ICA stent.  Vertebrals:  Bilateral vertebral arteries demonstrate antegrade flow.  Subclavians: Right subclavian artery was stenotic. Normal flow hemodynamics were seen in the left subclavian artery.   PCV ECHOCARDIOGRAM COMPLETE 02/23/2022  Normal LV systolic function with visual EF 60-65%. Left ventricle cavity is normal in size. Moderate to severe left ventricular hypertrophy, presence of a septal bulge.  Systolic motion of the anterior mitral valve leaflet (SAM).  Normal global wall motion. Unable to accurately evaluate diastolic function due to mitral annular calcification; however, doppler parameters suggestive of grade 2 diastolic dysfunction and elevated LAP. Aortic valve sclerosis without stenosis. Mild to moderate aortic regurgitation. Mild (Grade I) mitral regurgitation. Moderate calcification of the mitral valve annulus. Moderate tricuspid regurgitation. Mild pulmonary hypertension. RVSP measures 39 mmHg. Mild pulmonic regurgitation. Compared to 06/03/2020 Moderate AR is now mild/moderate, moderate MR is now mild, mild PHTN (RVSP 4mHG compared to before 334mG) / SAM / dilated coronary sinus are new findings.  [facility] Patch Extended out patient EKG monitoring 13 days starting 07/03/2022:   Dominant rhythm Sinus.  First degree AV block was present. Ventricular bigeminy was present, longest episode 4.6 seconds. HR 39-92 bpm. Avg HR 55 bpm. No atrial fibrillation/atrial flutter/SVT/VT/high grade AV block, sinus pause >3sec noted. 14 runs of probable atrial tachycardia with fastest interval lasting 6 beats with a max heart rate of 113 bpm. Isolated SVE <1.0%, SVE Couplets <1.0%, SVE Triplets <1.0% Isolated VE <1%, VE Couplets <1.0%, VE Triplets <1.0% Symptoms reported: None  EKG:    EKG 09/03/2022: Atrial fibrillation with controlled ventricular response at the rate of 58 bpm, left axis deviation, left anterior fascicular block.  Incomplete right bundle branch block.  Poor R progression, probably normal variant.  Nonspecific T abnormality.  Compared to 06/19/2021, sinus with first-degree AV block has now been replaced by atrial fibrillation otherwise no change.  EKG 07/16/2022: Sinus bradycardia with first-degree AV block at rate of 57 bpm.  Left axis deviation.  Left anterior fascicular block.  LVH.  Compared to previous EKG on 02/07/2022 no significant change.   Assessment     ICD-10-CM    1. Paroxysmal atrial fibrillation (HCC)  I48.0     2. Atypical atrial flutter (HCC)  I48.4     3. Acute on chronic diastolic heart failure (HCC)  I5W23.76asic metabolic panel    CBC    Pro b natriuretic peptide (BNP)    Magnesium    furosemide (LASIX) 40 MG tablet    potassium chloride (KLOR-CON) 10 MEQ tablet    4. SOB (shortness of breath)  R06.02 EKG 12-Lead    5. Primary hypertension  I10       CHA2DS2-VASc Score is 4.  Yearly risk of stroke: 4.8% (A, HTN, Vasc Dz).  Score of 1=0.6; 2=2.2; 3=3.2; 4=4.8; 5=7.2; 6=9.8; 7=>9.8) -(CHF; HTN; vasc disease DM,  Male = 1; Age <65 =0; 65-74 = 1,  >75 =2; stroke/embolism= 2).    Meds ordered this encounter  Medications   furosemide (LASIX) 40 MG tablet    Sig: Take 1 tablet (40 mg total) by mouth daily as needed. Weight  gain > 3 Lbs in 3 days or shortness of breath    Dispense:  30 tablet    Refill:  0   potassium chloride (KLOR-CON) 10 MEQ tablet    Sig: Take 2 tablets (20 mEq total) by mouth daily as needed. Take with Furosemide only    Dispense:  60 tablet    Refill:  0   There are no discontinued medications.  Recommendations:   Dalton Cardenas  is a 88 y.o. 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  Hence has been on chronic anticoagulation.  He also has history of left carotid TCAR on 08/01/2020.  He has had vasovagal syncope about 2 to 3 months ago and has had no recurrence.  He called our office last night stating that he has been having chest pain, worsening dyspnea and I brought him in to be seen.  1. Paroxysmal atrial fibrillation (HCC) Patient symptoms of worsening dyspnea, fatigue is related to recurrence of atrial fibrillation, previously has had atypical atrial flutter.  He is also in acute decompensated heart failure with elevated JVD,  bilateral leg edema right worse than the left and abdominal distention.  I will schedule him for direct-current cardioversion tomorrow. Schedule for Direct current cardioversion. I have discussed regarding risks benefits rate control vs rhythm control with the patient. Patient understands cardiac arrest and need for CPR, aspiration pneumonia, but not limited to these. Patient is willing.   2. Atypical atrial flutter (HCC) Prior history of atypical atrial flutter and he has had typical atrial flutter ablation in the remote past.  3. Acute on chronic diastolic heart failure (HCC) Presently in acute decompensated heart failure as evidenced by elevated JVD, abdominal distention, weight gain and leg edema.  I will start him on furosemide 40 mg once daily along with potassium 20 mEq daily on a as needed basis, clear-cut instructions given to the patient.  4. SOB (shortness of breath) Dyspnea is related to #1 and 3.  5. Primary hypertension Patient presently in RPM, blood pressure has been under excellent control.  Continue present medications.  I will see him back in 6 weeks for follow-up.  Note RPM: Patient's home BP is controlled on current antihypertensive regimen.    Average Systolic BP Level 270.35 mmHg Lowest Systolic BP Level 97 mmHg Highest Systolic BP Level 009 mmHg   Average Diastolic BP Level 38.1 mmHg Lowest Diastolic BP Level 56 mmHg Highest Diastolic BP Level 86 mmHg   Average Pulse Level 56.73 BPM Lowest Pulse Level 46 BPM Highest Pulse Level 76 BPM   09/02/2022 'Sunday at 12:38 PM        128 / 70                09/02/2022 Sunday at 12:36 PM        129 / 68                09/02/2022 Sunday at 12:35 PM        134 / 68                09/01/2022 Saturday at 12:13 PM      116 / 59                09/01/2022 Saturday at 12:12 PM      11'$ 9 / 58  09/01/2022 Saturday at 12:11 PM      123 / 69                08/31/2022 Friday at 11:26 AM          124 / 70                 08/31/2022 Friday at 11:24 AM          124 / 70                08/31/2022 Friday at [place] / 70   Adrian Prows, MD, [facility] 09/03/2022, 12:58 PM Office: [phone] Fax: [phone] Pager: [phone]

## 2022-09-04 ENCOUNTER — Encounter (HOSPITAL_COMMUNITY)
Admission: RE | Disposition: A | Discharge status: Home / Self Care | Payer: Self-pay | Source: Ambulatory Visit | Attending: Cardiology

## 2022-09-04 ENCOUNTER — Ambulatory Visit (HOSPITAL_BASED_OUTPATIENT_CLINIC_OR_DEPARTMENT_OTHER): Payer: Medicare Other | Admitting: Certified Registered"

## 2022-09-04 ENCOUNTER — Ambulatory Visit (HOSPITAL_COMMUNITY): Payer: Medicare Other | Admitting: Certified Registered"

## 2022-09-04 ENCOUNTER — Other Ambulatory Visit: Payer: Self-pay

## 2022-09-04 ENCOUNTER — Encounter (HOSPITAL_COMMUNITY): Payer: Self-pay | Admitting: Cardiology

## 2022-09-04 ENCOUNTER — Ambulatory Visit (HOSPITAL_COMMUNITY)
Admission: RE | Admit: 2022-09-04 | Discharge: 2022-09-04 | Disposition: A | Discharge status: Home / Self Care | Payer: Medicare Other | Source: Ambulatory Visit | Attending: Cardiology | Admitting: Cardiology

## 2022-09-04 DIAGNOSIS — I444 Left anterior fascicular block: Secondary | ICD-10-CM | POA: Diagnosis not present

## 2022-09-04 DIAGNOSIS — I491 Atrial premature depolarization: Secondary | ICD-10-CM | POA: Diagnosis not present

## 2022-09-04 DIAGNOSIS — Z9989 Dependence on other enabling machines and devices: Secondary | ICD-10-CM | POA: Diagnosis not present

## 2022-09-04 DIAGNOSIS — I110 Hypertensive heart disease with heart failure: Secondary | ICD-10-CM | POA: Diagnosis not present

## 2022-09-04 DIAGNOSIS — I739 Peripheral vascular disease, unspecified: Secondary | ICD-10-CM | POA: Insufficient documentation

## 2022-09-04 DIAGNOSIS — I484 Atypical atrial flutter: Secondary | ICD-10-CM | POA: Diagnosis not present

## 2022-09-04 DIAGNOSIS — Z7901 Long term (current) use of anticoagulants: Secondary | ICD-10-CM | POA: Insufficient documentation

## 2022-09-04 DIAGNOSIS — M1990 Unspecified osteoarthritis, unspecified site: Secondary | ICD-10-CM

## 2022-09-04 DIAGNOSIS — Z79899 Other long term (current) drug therapy: Secondary | ICD-10-CM | POA: Insufficient documentation

## 2022-09-04 DIAGNOSIS — I10 Essential (primary) hypertension: Secondary | ICD-10-CM | POA: Diagnosis not present

## 2022-09-04 DIAGNOSIS — E7800 Pure hypercholesterolemia, unspecified: Secondary | ICD-10-CM | POA: Insufficient documentation

## 2022-09-04 DIAGNOSIS — I2510 Atherosclerotic heart disease of native coronary artery without angina pectoris: Secondary | ICD-10-CM | POA: Diagnosis not present

## 2022-09-04 DIAGNOSIS — I5033 Acute on chronic diastolic (congestive) heart failure: Secondary | ICD-10-CM | POA: Insufficient documentation

## 2022-09-04 DIAGNOSIS — I480 Paroxysmal atrial fibrillation: Secondary | ICD-10-CM | POA: Insufficient documentation

## 2022-09-04 DIAGNOSIS — G4733 Obstructive sleep apnea (adult) (pediatric): Secondary | ICD-10-CM | POA: Diagnosis not present

## 2022-09-04 DIAGNOSIS — I4891 Unspecified atrial fibrillation: Secondary | ICD-10-CM | POA: Diagnosis not present

## 2022-09-04 DIAGNOSIS — I4819 Other persistent atrial fibrillation: Secondary | ICD-10-CM

## 2022-09-04 HISTORY — PX: CARDIOVERSION: SHX1299

## 2022-09-04 LAB — BASIC METABOLIC PANEL
BUN/Creatinine Ratio: 18 (ref 10–24)
BUN: 20 mg/dL (ref 8–27)
CO2: 20 mmol/L (ref 20–29)
Calcium: 9.4 mg/dL (ref 8.6–10.2)
Chloride: 102 mmol/L (ref 96–106)
Creatinine, Ser: 1.1 mg/dL (ref 0.76–1.27)
Glucose: 121 mg/dL — ABNORMAL HIGH (ref 70–99)
Potassium: 4.4 mmol/L (ref 3.5–5.2)
Sodium: 138 mmol/L (ref 134–144)
eGFR: 65 mL/min/{1.73_m2} (ref >59)

## 2022-09-04 LAB — CBC
Hematocrit: 37 % — ABNORMAL LOW (ref 37.5–51.0)
Hemoglobin: 12 g/dL — ABNORMAL LOW (ref 13.0–17.7)
MCH: 29.7 pg (ref 26.6–33.0)
MCHC: 32.4 g/dL (ref 31.5–35.7)
MCV: 92 fL (ref 79–97)
Platelets: 295 10*3/uL (ref 150–450)
RBC: 4.04 x10E6/uL — ABNORMAL LOW (ref 4.14–5.80)
RDW: 13.1 % (ref 11.6–15.4)
WBC: 8.6 10*3/uL (ref 3.4–10.8)

## 2022-09-04 LAB — PRO B NATRIURETIC PEPTIDE: NT-Pro BNP: 4148 pg/mL — ABNORMAL HIGH (ref 0–486)

## 2022-09-04 LAB — MAGNESIUM: Magnesium: 2.6 mg/dL — ABNORMAL HIGH (ref 1.6–2.3)

## 2022-09-04 SURGERY — CARDIOVERSION
Anesthesia: General

## 2022-09-04 MED ORDER — SODIUM CHLORIDE 0.9 % IV SOLN: INTRAVENOUS | Status: DC

## 2022-09-04 MED ORDER — PROPOFOL 10 MG/ML IV BOLUS
INTRAVENOUS | Status: DC | PRN
  Administered 2022-09-04: 50 mg via INTRAVENOUS

## 2022-09-04 MED ORDER — LIDOCAINE HCL (CARDIAC) PF 100 MG/5ML IV SOSY
PREFILLED_SYRINGE | INTRAVENOUS | Status: DC | PRN
  Administered 2022-09-04: 50 mg via INTRAVENOUS

## 2022-09-04 NOTE — Anesthesia Postprocedure Evaluation (Signed)
Anesthesia Post Note  Patient: Dalton Cardenas  Procedure(s) Performed: CARDIOVERSION     Patient location during evaluation: PACU Anesthesia Type: General Level of consciousness: awake and alert and oriented Pain management: pain level controlled Vital Signs Assessment: post-procedure vital signs reviewed and stable Respiratory status: spontaneous breathing, nonlabored ventilation and respiratory function stable Cardiovascular status: blood pressure returned to baseline and stable Postop Assessment: no apparent nausea or vomiting Anesthetic complications: no   No notable events documented.  Last Vitals:  Vitals:   09/04/22 1250 09/04/22 1300  BP: 125/65 120/68  Pulse: (!) 51 (!) 55  Resp: 15 16  Temp:    SpO2: 95% 98%    Last Pain:  Vitals:   09/04/22 1300  TempSrc:   PainSc: 0-No pain                 Marysa Wessner A.

## 2022-09-04 NOTE — Anesthesia Preprocedure Evaluation (Addendum)
Anesthesia Evaluation  Patient identified by MRN, date of birth, ID band Patient awake    Reviewed: Allergy & Precautions, H&P , NPO status , Patient's Chart, lab work & pertinent test results, reviewed documented beta blocker date and time   Airway Mallampati: I  TM Distance: >3 FB Neck ROM: Full    Dental  (+) Caps, Dental Advisory Given, Missing   Pulmonary sleep apnea and Continuous Positive Airway Pressure Ventilation    Pulmonary exam normal breath sounds clear to auscultation       Cardiovascular hypertension, Pt. on medications and Pt. on home beta blockers + CAD and + Peripheral Vascular Disease  Normal cardiovascular exam+ dysrhythmias Atrial Fibrillation and Supra Ventricular Tachycardia  Rhythm:Irregular Rate:Normal  S/P a fib ablation 2016   Neuro/Psych negative neurological ROS  negative psych ROS   GI/Hepatic Neg liver ROS,GERD  Medicated,,  Endo/Other  negative endocrine ROS    Renal/GU negative Renal ROS  negative genitourinary   Musculoskeletal  (+) Arthritis , Osteoarthritis,    Abdominal   Peds negative pediatric ROS (+)  Hematology Eliquis therapy- last dose this am   Anesthesia Other Findings   Reproductive/Obstetrics negative OB ROS                              Anesthesia Physical Anesthesia Plan  ASA: 3  Anesthesia Plan: General   Post-op Pain Management: Minimal or no pain anticipated   Induction: Intravenous  PONV Risk Score and Plan: 2 and Treatment may vary due to age or medical condition  Airway Management Planned: Mask  Additional Equipment:   Intra-op Plan:   Post-operative Plan:   Informed Consent: I have reviewed the patients History and Physical, chart, labs and discussed the procedure including the risks, benefits and alternatives for the proposed anesthesia with the patient or authorized representative who has indicated his/her  understanding and acceptance.     Dental advisory given  Plan Discussed with: CRNA and Surgeon  Anesthesia Plan Comments:        Anesthesia Quick Evaluation

## 2022-09-04 NOTE — Interval H&P Note (Signed)
History and Physical Interval Note:  09/04/2022 12:09 PM  Dalton Cardenas  has presented today for surgery, with the diagnosis of afib.  The various methods of treatment have been discussed with the patient and family. After consideration of risks, benefits and other options for treatment, the patient has consented to  Procedure(s): CARDIOVERSION (N/A) as a surgical intervention.  The patient's history has been reviewed, patient examined, no change in status, stable for surgery.  I have reviewed the patient's chart and labs.  Questions were answered to the patient's satisfaction.     Adrian Prows

## 2022-09-04 NOTE — Discharge Instructions (Signed)

## 2022-09-04 NOTE — Transfer of Care (Signed)
Immediate Anesthesia Transfer of Care Note  Patient: Dalton Cardenas  Procedure(s) Performed: CARDIOVERSION  Patient Location: PACU  Anesthesia Type:General  Level of Consciousness: drowsy  Airway & Oxygen Therapy: Patient Spontanous Breathing and Patient connected to nasal cannula oxygen  Post-op Assessment: Report given to RN and Post -op Vital signs reviewed and stable  Post vital signs: Reviewed and stable  Last Vitals:  Vitals Value Taken Time  BP    Temp    Pulse    Resp    SpO2      Last Pain:  Vitals:   09/04/22 1150  TempSrc: Temporal  PainSc: 0-No pain         Complications: No notable events documented.

## 2022-09-04 NOTE — CV Procedure (Signed)
Direct current cardioversion 09/04/2022 12:28 PM  Indication symptomatic A. Fibrillation.  Procedure: Using 50 mg of IV Propofol and 50 IV Lidocaine (for reducing venous pain) for achieving deep sedation, synchronized direct current cardioversion performed. Patient was delivered with 150 Joules of electricity X 1 with success to NSR. Patient tolerated the procedure well. No immediate complication noted.   Allergies as of 09/04/2022       Reactions   Amoxicillin Diarrhea   GI upset: Bleeding diarrhea    Ibuprofen Diarrhea   Bloody diarrhea   Penicillins Diarrhea   Bloody diarrhea   Ace Inhibitors Swelling   Face swelling   Augmentin [amoxicillin-pot Clavulanate] Diarrhea   Doxycycline Hyclate Diarrhea   Spironolactone    Hyponatremia    Atorvastatin    Weakness   Azithromycin    Stomach ache   Diltiazem Nausea Only   Upset stomach per patient   Hydrochlorothiazide Other (See Comments)   Photosensitivity        Medication List     TAKE these medications    acebutolol 200 MG capsule Commonly known as: SECTRAL Take 1 capsule (200 mg total) by mouth 2 (two) times daily.   ALIGN PO Take 1 capsule by mouth daily.   amLODipine 5 MG tablet Commonly known as: NORVASC Take 1 tablet (5 mg total) by mouth daily.   apixaban 2.5 MG Tabs tablet Commonly known as: ELIQUIS Take 1 tablet (2.5 mg total) by mouth 2 (two) times daily.   ascorbic acid 500 MG tablet Commonly known as: VITAMIN C Take 500 mg by mouth daily.   b complex vitamins tablet Take 1 tablet by mouth daily.   B-12 5000 MCG Caps Take 5,000 mcg by mouth daily.   CHROMIUM PO Take 1 tablet by mouth daily.   Co Q-10 200 MG Caps Take 200 mg by mouth daily.   folic acid 308 MCG tablet Commonly known as: FOLVITE Take 800 mcg by mouth daily.   furosemide 40 MG tablet Commonly known as: LASIX Take 1 tablet (40 mg total) by mouth daily as needed. Weight gain > 3 Lbs in 3 days or shortness of breath    hydrALAZINE 100 MG tablet Commonly known as: APRESOLINE Take 1 (100 mg) tablet by mouth every morning and evening. Take 1 (50 mg) tablet daily in the afternoon. What changed:  how much to take how to take this when to take this additional instructions   hydrALAZINE 50 MG tablet Commonly known as: APRESOLINE Take 1 (100 mg) tablet by mouth every morning and evening. Take 1 (50 mg) tablet daily in the afternoon. What changed:  how much to take how to take this when to take this additional instructions   K2 PO Take by mouth daily at 6 (six) AM.   L-Arginine 1000 MG Tabs Take 1,000 mg by mouth daily.   LUTEIN PO Take 1 tablet by mouth daily.   Magnesium 200 MG Tabs Take 400 mg by mouth daily.   melatonin 5 MG Tabs Take 5 mg by mouth at bedtime as needed (sleep).   potassium chloride 10 MEQ tablet Commonly known as: KLOR-CON Take 2 tablets (20 mEq total) by mouth daily as needed. Take with Furosemide only   rosuvastatin 10 MG tablet Commonly known as: CRESTOR Take 1 tablet (10 mg total) by mouth daily.   selenium 50 MCG Tabs tablet Take 50 mcg by mouth daily.   valsartan 160 MG tablet Commonly known as: Diovan Take 1 tablet (160 mg total) by  mouth daily.   Vitamin A 2400 MCG (8000 UT) Caps Take 8,000 Units by mouth daily.   Vitamin D 125 MCG (5000 UT) Caps Take 5,000 Units by mouth daily.   zinc gluconate 50 MG tablet Take 50 mg by mouth daily.          Adrian Prows, MD, [facility] 09/04/2022, 12:28 PM Office: [phone] Fax: [phone] Pager: [phone]

## 2022-09-04 NOTE — Progress Notes (Signed)
Pt reports drinking milk in his tea at approximately 0545. Dr Kalman Shan with anesthesia notified, procedure postponed for at least 6 hours from time of drink

## 2022-09-05 ENCOUNTER — Encounter (HOSPITAL_BASED_OUTPATIENT_CLINIC_OR_DEPARTMENT_OTHER): Payer: Medicare Other | Admitting: Physical Therapy

## 2022-09-10 ENCOUNTER — Encounter (HOSPITAL_COMMUNITY): Payer: Self-pay | Admitting: Cardiology

## 2022-09-12 ENCOUNTER — Encounter (HOSPITAL_BASED_OUTPATIENT_CLINIC_OR_DEPARTMENT_OTHER): Payer: Self-pay | Admitting: Physical Therapy

## 2022-09-12 ENCOUNTER — Ambulatory Visit (HOSPITAL_BASED_OUTPATIENT_CLINIC_OR_DEPARTMENT_OTHER): Payer: Medicare Other | Admitting: Physical Therapy

## 2022-09-12 DIAGNOSIS — M6281 Muscle weakness (generalized): Secondary | ICD-10-CM | POA: Diagnosis not present

## 2022-09-12 DIAGNOSIS — R2689 Other abnormalities of gait and mobility: Secondary | ICD-10-CM | POA: Diagnosis not present

## 2022-09-12 NOTE — Therapy (Signed)
OUTPATIENT PHYSICAL THERAPY LOWER EXTREMITY TREATMENT / PROGRESS NOTE  Progress Note Reporting Period 06/13/22 to 09/12/22  See note below for Objective Data and Assessment of Progress/Goals.       Patient Name: Dalton Cardenas MRN: [id number] DOB:[birth year], 88 y.o., male Today's Date: 09/12/2022   PT End of Session - 09/12/22 1537     Visit Number 10    Number of Visits 13    Date for PT Re-Evaluation 10/10/22    Authorization Type [employer] MCR    PT Start Time 1525    PT Stop Time 9735    PT Time Calculation (min) 49 min    Activity Tolerance Patient tolerated treatment well    Behavior During Therapy [facility] for tasks assessed/performed               Past Medical History:  Diagnosis Date   Coronary artery disease    60-70% circumflex (cath in [place] 08/2010).   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 Holter monitoring 2011 - AV node reentry, multifocal atrial tachycardia, and resting bradycardia. b. previously tx with [facility] in [place].   Past Surgical History:  Procedure Laterality Date   ABLATION OF DYSRHYTHMIC FOCUS  10/13/2014   SVT         by Dr Jacquelyne Balint FISSURE REPAIR     CARDIOVERSION N/A 07/01/2020   Procedure: CARDIOVERSION;  Surgeon: Adrian Prows, MD;  Location: [facility] ENDOSCOPY;  Service: Cardiovascular;  Laterality: N/A;   CARDIOVERSION N/A 09/04/2022   Procedure: CARDIOVERSION;  Surgeon: Adrian Prows, MD;  Location: [place];  Service: Cardiovascular;  Laterality: N/A;   ELECTROPHYSIOLOGY STUDY N/A 10/13/2014   Procedure: ELECTROPHYSIOLOGY STUDY;  Surgeon: Evans Lance, MD;  Location: [facility] CATH LAB;  Service: Cardiovascular;   Laterality: N/A;   EYE SURGERY Bilateral 2011   cataract    PROSTATECTOMY     SUPRAVENTRICULAR TACHYCARDIA ABLATION N/A 10/13/2014   Procedure: SUPRAVENTRICULAR TACHYCARDIA ABLATION;  Surgeon: Evans Lance, MD;  Location: [facility] CATH LAB;  Service: Cardiovascular;  Laterality: N/A;   TRANSCAROTID ARTERY REVASCULARIZATION (TCAR) Left 08/01/2020   TRANSCAROTID ARTERY REVASCULARIZATION  Left 08/01/2020   Procedure: LEFT TRANSCAROTID ARTERY REVASCULARIZATION;  Surgeon: Elam Dutch, MD;  Location: [place];  Service: Vascular;  Laterality: Left;   ULTRASOUND GUIDANCE FOR VASCULAR ACCESS Right 08/01/2020   Procedure: ULTRASOUND GUIDANCE FOR VASCULAR ACCESS;  Surgeon: Elam Dutch, MD;  Location: [facility] OR;  Service: Vascular;  Laterality: Right;   Patient Active Problem List   Diagnosis Date Noted   Presence of internal carotid stent (TCAR Left 2021) 02/07/2022   Primary osteoarthritis of left knee 05/01/2021   DVT (deep venous thrombosis) ([place]) 11/24/2020   Carotid stenosis 08/01/2020   Hyponatremia 06/30/2020   Atrial flutter ([place]) 06/30/2020   Chronic cough 11/26/2019   Neck pain 07/02/2018   SVT (supraventricular tachycardia) 10/13/2014   Syncope 07/22/2012   Coronary artery disease    CHEST PAIN UNSPECIFIED 04/13/2010   ADENOCARCINOMA, PROSTATE 12/12/2009   HYPERCHOLESTEROLEMIA 12/12/2009   Obstructive sleep apnea 12/12/2009   Essential hypertension, benign 12/12/2009   Supraventricular tachycardia 12/12/2009  PCP: Aretta Nip, MD  REFERRING PROVIDER: Leeroy Cha, MD  REFERRING DIAG: R26.9 (ICD-10-CM) - Unspecified abnormalities of gait and mobility  THERAPY DIAG:  Other abnormalities of gait and mobility  Muscle weakness (generalized)  Rationale for Evaluation and Treatment Rehabilitation  ONSET DATE: 11/2021  SUBJECTIVE:   SUBJECTIVE STATEMENT:  Pt was having some bloating and difficulty breathing.  Pt saw his cardiologist and had an EKG.  Pt  was informed he had CHF and MD performed cardioversion on 09/04/22.  Pt had 48 hour restrictions after cardioversion.  Pt spoke to cardiologist yesterday who cleared him to return to PT and stated he doesn't have any restrictions.   Pt reports improved balance and performing sit to stand transfers.  He feels that he needs increased strength in legs.  He has minimal difficulty getting up from lower chairs.  Pt wants to continue with PT and improve LE strength.  Pt denies pain in hip and states he still has stiffness.  Pt denies any falls.  Pt states he has not performed his exercises this past week due to A-fib and cardioversion though was performing prior.    PERTINENT HISTORY: CAD, prostate cancer, syncope, SVT, L shoulder RC injury, A-fib with hx of ablation, and Hx of DVT in 2022  PAIN:  Are you having pain? No: NPRS scale: 0/10 Pain location: L hip Pain description: aching   PRECAUTIONS: None  WEIGHT BEARING RESTRICTIONS No  FALLS:  Has patient fallen in last 6 months? Yes. 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    OBJECTIVE:   DIAGNOSTIC FINDINGS: N/A    TODAY'S TREATMENT:  -Reviewed pt presentation, current function, response to prior Rx, pain level, and HEP compliance. -Assessed vitals, gait speed, Berg Balance Test, 5x STS test. -Pt completed FOTO -See pt education below  At beginning of Rx: O2:  99% HR:  65-66  During Berg: O2:68 HR:  99  After gait speed test: HR:  70 which improved to 65 O2:  99%  After 5x STS: HR:  55 O2:  98%    FUNCTIONAL TESTS:  -5 times sit to stand: Initial/Current:  22.4  / 14.9 s.  Pt has minimal  discomfort in knees.    -BERG Balance Test                       Sit to Stand 4  Standing unsupported 4  Sitting with back unsupported but feet supported 4  Stand to sit  4  Transfers  4  Standing unsupported with eyes closed 4  Standing unsupported feet together 4  From standing position, reach forward with outstretched arm 3  From standing position, pick up object from floor 4  From standing position, turn and look behind over each shoulder 4  Turn 360 4  Standing unsupported, alternately place foot on step 4  Standing unsupported, one foot in front 3  Standing on one leg 3  Total:  53/56      -Gait speed: 1.18 m/s (64f in 9s)     -PATIENT SURVEYS:  FOTO:  Initial/Current:  57/59 Goal:  66pts @ DC 5 pts MCII   PATIENT  EDUCATION:  Education details:  Educated pt concerning objective findings and progress/comparison based upon initial findings.  Rationale of berg scores.  Educated Pt concerning POC and what PT will focus on with remaining visits.   Person educated: Patient Education method: Explanation, Demonstration, verbal cues Education comprehension: verbalized understanding, verbal cues required, and returned demonstration     HOME EXERCISE PROGRAM:  Access Code: [email or web address] URL: [email or web address] Date: 06/13/2022 Prepared by: Daleen Bo   ASSESSMENT:   CLINICAL IMPRESSION:  Pt has made great progress in PT.  He reports improved balance and performance of sit/stand transfers though does report continued LE weakness.  He demonstrates much improved balance with Berg Balance Test score improving from 44/56 initially to 53/56.  Pt demonstrates improved functional LE strength and performance of transfers with 5x STS improving from 22.4 s initially to 14.9 seconds currently.  Pt also demonstrates improved gait speed.  Pt's FOTO score showed minimal improvement.  PT did not assess LE strength in order to not overexert pt as he had a cardioversion last  week.  Pt states he is feeling fine since cardioversion and had no c/o's today during Rx.  PT closely monitored vitals which were good t/o Rx.  Pt is progressing toward goals.  He has met STG #1 and LTG's #2,3 and partially met STG #2.  Pt should benefit from continued PT for 2-3 more visits to assess and work on LE strength and to complete final HEP.        OBJECTIVE IMPAIRMENTS decreased balance, decreased coordination, decreased endurance, decreased knowledge of use of DME, decreased mobility, difficulty walking, decreased strength, decreased safety awareness, impaired sensation, impaired UE functional use, improper body mechanics, postural dysfunction, obesity, and pain.    ACTIVITY LIMITATIONS carrying, lifting, squatting, stairs, transfers, reach over head, and locomotion level   PARTICIPATION LIMITATIONS: cleaning, laundry, community activity, and yard work   PERSONAL FACTORS Education, Fitness, Past/current experiences, Time since onset of injury/illness/exacerbation, and 1-2 comorbidities are also affecting patient's functional outcome.    REHAB POTENTIAL: Good   CLINICAL DECISION MAKING: Stable/uncomplicated   EVALUATION COMPLEXITY: Low     GOALS:    SHORT TERM GOALS: Target date: 07/25/2022   Pt will become independent with HEP in order to demonstrate synthesis of PT education.  Goal status: met   2.  Pt will score at least 5 pt increase on FOTO to demonstrate functional improvement in MCII and pt perceived function.    Goal status: PARTIALLY MET      LONG TERM GOALS: Target date: 10/10/2022     Pt  will become independent with final HEP in order to demonstrate synthesis of PT education.  Goal status: ongoing   2.   Pt will have an at least 45/56 on Berg Balance scale in order to demonstrate improvement above cut off score for risk of falls.   Goal status: GOAL MET   3.   Pt will be able to demonstrate gait speed of 1 m/s or greater in order to demonstrate  functional improvement in LE function for prevention of falls.  Goal status:  GOAL MET   4.   Pt will score >/= 66 on FOTO to demonstrate improvement in perceived LE/balance function.  Goal status: ONGOING         PLAN: PT FREQUENCY: 1x/week for 2-3 more visits   PT DURATION:  4 weeks    PLANNED INTERVENTIONS: Therapeutic exercises, Therapeutic activity, Neuromuscular re-education, Balance training, Gait training, Patient/Family education, Joint mobilization, Stair training,  DME instructions, Aquatic Therapy, Dry Needling, Electrical stimulation, Cryotherapy, Moist heat, Taping, Ultrasound, Ionotophoresis 50m/ml Dexamethasone, Manual therapy, and Re-evaluation   PLAN FOR NEXT SESSION:  Pt to be seen for 2-3 more visits.  Assess LE strength and focus on HEP.    RSelinda MichaelsIII PT, DPT 09/12/22 4:45 PM

## 2022-09-13 ENCOUNTER — Telehealth: Payer: Self-pay

## 2022-09-13 NOTE — Patient Outreach (Signed)
  Care Coordination   Initial Visit Note   09/13/2022 Name: Dalton Cardenas MRN: [id number] DOB: 08-12-1934  Dalton Cardenas is a 88 y.o. year old male who sees Leeroy Cha, MD for primary care. I spoke with  Dalton Cardenas by phone today.  What matters to the patients health and wellness today?  I am doing better since I had my cardioversion.  States he checks his pulse and B/P daily and B/P ranges 130-150/60-70.  States he is going to PT now and is trying to get stronger.    Goals Addressed             This Visit's Progress    Care Coordination Activities       Care Coordination Interventions: Advised patient to keep scheduled appointments with providers , to schedule Annual Wellness Visit Provided education to patient re: s/sx of A Fib to call provider, care coordination services Discussed plans with patient for ongoing care management follow up and provided patient with direct contact information for care management team Assessed social determinant of health barriers Counseled on increased risk of stroke due to Afib and benefits of anticoagulation for stroke prevention Reviewed importance of adherence to anticoagulant exactly as prescribed Afib action plan reviewed          SDOH assessments and interventions completed:  Yes  SDOH Interventions Today    Flowsheet Row Most Recent Value  SDOH Interventions   Food Insecurity Interventions Intervention Not Indicated  Housing Interventions Intervention Not Indicated  Transportation Interventions Intervention Not Indicated  Utilities Interventions Intervention Not Indicated  Financial Strain Interventions Intervention Not Indicated        Care Coordination Interventions:  Yes, provided   Follow up plan: Follow up call scheduled for 10/24/22    Encounter Outcome:  Pt. Visit Completed  Peter Garter RN, [facility], [place] Management [phone]

## 2022-09-13 NOTE — Patient Instructions (Signed)
Visit Information  Thank you for taking time to visit with me today. Please don't hesitate to contact me if I can be of assistance to you.   Following are the goals we discussed today:   Goals Addressed             This Visit's Progress    Care Coordination Activities       Care Coordination Interventions: Advised patient to keep scheduled appointments with providers , to schedule Annual Wellness Visit Provided education to patient re: s/sx of A Fib to call provider, care coordination services Discussed plans with patient for ongoing care management follow up and provided patient with direct contact information for care management team Assessed social determinant of health barriers Counseled on increased risk of stroke due to Afib and benefits of anticoagulation for stroke prevention Reviewed importance of adherence to anticoagulant exactly as prescribed Afib action plan reviewed          Our next appointment is by telephone on 10/24/22 at 1PM  Please call the care guide team at [phone] if you need to cancel or reschedule your appointment.   If you are experiencing a Mental Health or [place] or need someone to talk to, please call the Suicide and Crisis Lifeline: 988 call the [place] National Suicide Prevention Lifeline: 207 772 1468 or TTY: 780-609-3090 TTY 419-689-4063) to talk to a trained counselor call 1-800-273-TALK (toll free, 24 hour hotline) go to [facility] [address], [place] [phone]) call 911   Patient verbalizes understanding of instructions and care plan provided today and agrees to view in [place]. Active MyChart status and patient understanding of how to access instructions and care plan via MyChart confirmed with patient.     Telephone follow up appointment with care management team member scheduled for: 10/24/22  Peter Garter RN, [facility] [place], [place] [facility] [phone]

## 2022-09-19 ENCOUNTER — Encounter (HOSPITAL_BASED_OUTPATIENT_CLINIC_OR_DEPARTMENT_OTHER): Payer: Self-pay | Admitting: Physical Therapy

## 2022-09-19 ENCOUNTER — Ambulatory Visit (HOSPITAL_BASED_OUTPATIENT_CLINIC_OR_DEPARTMENT_OTHER): Payer: Medicare Other | Attending: Internal Medicine | Admitting: Physical Therapy

## 2022-09-19 DIAGNOSIS — M6281 Muscle weakness (generalized): Secondary | ICD-10-CM | POA: Insufficient documentation

## 2022-09-19 DIAGNOSIS — R2689 Other abnormalities of gait and mobility: Secondary | ICD-10-CM

## 2022-09-19 NOTE — Therapy (Signed)
OUTPATIENT PHYSICAL THERAPY LOWER EXTREMITY TREATMENT Patient Name: Dalton Cardenas MRN: [id number] DOB:04-04-1934, 89 y.o., male Today's Date: 09/19/2022   PT End of Session - 09/19/22 1553     Visit Number 11    Number of Visits 13    Date for PT Re-Evaluation 10/10/22    Authorization Type U[employer] MCR    PT Start Time 3235    PT Stop Time 1550    PT Time Calculation (min) 33 min    Activity Tolerance Patient tolerated treatment well    Behavior During Therapy [facility] for tasks assessed/performed                Past Medical History:  Diagnosis Date   Coronary artery disease    60-70% circumflex (cath in [place] 08/2010).   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 Holter monitoring 2011 - AV node reentry, multifocal atrial tachycardia, and resting bradycardia. b. previously tx with Abana in [place].   Past Surgical History:  Procedure Laterality Date   ABLATION OF DYSRHYTHMIC FOCUS  10/13/2014   SVT         by Dr Jacquelyne Balint FISSURE REPAIR     CARDIOVERSION N/A 07/01/2020   Procedure: CARDIOVERSION;  Surgeon: Adrian Prows, MD;  Location: [facility] ENDOSCOPY;  Service: Cardiovascular;  Laterality: N/A;   CARDIOVERSION N/A 09/04/2022   Procedure: CARDIOVERSION;  Surgeon: Adrian Prows, MD;  Location: [place];  Service: Cardiovascular;  Laterality: N/A;   ELECTROPHYSIOLOGY STUDY N/A 10/13/2014   Procedure: ELECTROPHYSIOLOGY STUDY;  Surgeon: Evans Lance, MD;  Location: [facility] CATH LAB;  Service: Cardiovascular;  Laterality: N/A;   EYE SURGERY Bilateral 2011   cataract    PROSTATECTOMY     SUPRAVENTRICULAR TACHYCARDIA ABLATION N/A 10/13/2014   Procedure: SUPRAVENTRICULAR  TACHYCARDIA ABLATION;  Surgeon: Evans Lance, MD;  Location: [facility] CATH LAB;  Service: Cardiovascular;  Laterality: N/A;   TRANSCAROTID ARTERY REVASCULARIZATION (TCAR) Left 08/01/2020   TRANSCAROTID ARTERY REVASCULARIZATION  Left 08/01/2020   Procedure: LEFT TRANSCAROTID ARTERY REVASCULARIZATION;  Surgeon: Elam Dutch, MD;  Location: [place];  Service: Vascular;  Laterality: Left;   ULTRASOUND GUIDANCE FOR VASCULAR ACCESS Right 08/01/2020   Procedure: ULTRASOUND GUIDANCE FOR VASCULAR ACCESS;  Surgeon: Elam Dutch, MD;  Location: [facility] OR;  Service: Vascular;  Laterality: Right;   Patient Active Problem List   Diagnosis Date Noted   Presence of internal carotid stent (TCAR Left 2021) 02/07/2022   Primary osteoarthritis of left knee 05/01/2021   DVT (deep venous thrombosis) ([place]) 11/24/2020   Carotid stenosis 08/01/2020   Hyponatremia 06/30/2020   Atrial flutter ([place]) 06/30/2020   Chronic cough 11/26/2019   Neck pain 07/02/2018   SVT (supraventricular tachycardia) 10/13/2014   Syncope 07/22/2012   Coronary artery disease    CHEST PAIN UNSPECIFIED 04/13/2010   ADENOCARCINOMA, PROSTATE 12/12/2009   HYPERCHOLESTEROLEMIA 12/12/2009   Obstructive sleep apnea 12/12/2009   Essential hypertension, benign 12/12/2009   Supraventricular tachycardia 12/12/2009    PCP: Aretta Nip, MD  REFERRING PROVIDER: Leeroy Cha, MD  REFERRING DIAG: R26.9 (ICD-10-CM) - Unspecified abnormalities of gait and mobility  THERAPY  DIAG:  Other abnormalities of gait and mobility  Muscle weakness (generalized)  Rationale for Evaluation and Treatment Rehabilitation  ONSET DATE: 11/2021  SUBJECTIVE:   SUBJECTIVE STATEMENT:  Pt reports no issues after last session. He is a little wobbly for the first few steps. He feels like his legs are maybe not as strong as possible. He feels like it is from the hips. Otherwise, he feels good.    Previous:  Pt was having some bloating and  difficulty breathing.  Pt saw his cardiologist and had an EKG.  Pt was informed he had CHF and MD performed cardioversion on 09/04/22.  Pt had 48 hour restrictions after cardioversion.  Pt spoke to cardiologist yesterday who cleared him to return to PT and stated he doesn't have any restrictions.   Pt reports improved balance and performing sit to stand transfers.  He feels that he needs increased strength in legs.  He has minimal difficulty getting up from lower chairs.  Pt wants to continue with PT and improve LE strength.  Pt denies pain in hip and states he still has stiffness.  Pt denies any falls.  Pt states he has not performed his exercises this past week due to A-fib and cardioversion though was performing prior.    PERTINENT HISTORY: CAD, prostate cancer, syncope, SVT, L shoulder RC injury, A-fib with hx of ablation, and Hx of DVT in 2022  PAIN:  Are you having pain? No: NPRS scale: 0/10 Pain location: L hip Pain description: aching   PRECAUTIONS: None  WEIGHT BEARING RESTRICTIONS No  FALLS:  Has patient fallen in last 6 months? Yes. 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    OBJECTIVE:   DIAGNOSTIC FINDINGS: N/A    TODAY'S TREATMENT:   Exercises - Seated Hamstring Stretch  - 2 x daily - 7 x weekly - 1 sets - 3 reps - 30 hold - Standing Hip Flexor Stretch  - 2 x daily - 7 x weekly - 1 sets - 2 reps - 30 hold - Side Lunge Adductor Stretch  - 2 x daily - 7 x weekly - 1 sets - 3 reps - 30 hold - Seated Figure 4 Piriformis Stretch  - 2 x daily - 7 x weekly - 1 sets - 3 reps - 30 hold - Seated Flexion Stretch with Swiss Ball  - 1 x daily - 7 x weekly -  3 sets - 10 reps - Sit to Stand with Resistance Around Legs  - 1 x daily - 2-3 x weekly - 1 sets - 10 reps - Side Stepping with Resistance at Thighs  - 1 x daily - 2-3 x weekly - 1 sets - 3 reps - 55f hold - Standing Shoulder Row with Anchored Resistance  - 1 x daily - 2-3 x weekly - 3 sets - 10 reps - Full Leg Press  - 1 x daily - 2-3 x weekly - 2 sets - 10 reps - Knee Extension with Weight Machine  - 1 x daily - 2-3 x weekly - 2 sets - 10 reps MMT Right 1/3 Left 1/3  Hip flexion 4+ 4+  Hip extension    Hip abduction  4+ 4+  Hip adduction 5 5  Hip internal rotation    Hip external rotation    Knee flexion 4+ 4+  Knee extension 4+ 4+   (Blank rows = not tested)    PATIENT EDUCATION:  Education details:   anatomy, exercise progression,  HEP, POC  Person educated: Patient Education method: Explanation, Demonstration, verbal cues Education comprehension: verbalized understanding, verbal cues required, and returned demonstration     HOME EXERCISE PROGRAM:  Access Code: [email or web address] URL: [email or web address] Date: 06/13/2022 Prepared by: Daleen Bo   ASSESSMENT:   CLINICAL IMPRESSION: Given patient's current progress with improvement in functional mobility and lower extremity strength, patient given review of home exercise program with addition of new stretching in order to improve complaints of hip stiffness and weakness when first coming up and standing during transfers.  Patient with significant improvement in lower extremity function as well as community mobility and ambulation.  Plan to decrease frequency as patient is planning to join a gym and begin to transition towards independent exercise.  Patient advised schedule 1 more visit with the potential of canceling if no longer needed.  HEP reviewed and provided today via handout.  Patient with good response throughout session with all exercises and advised on reducing stiffness for improvement in mobility.  OBJECTIVE  IMPAIRMENTS decreased balance, decreased coordination, decreased endurance, decreased knowledge of use of DME, decreased mobility, difficulty walking, decreased strength, decreased safety awareness, impaired sensation, impaired UE functional use, improper body mechanics, postural dysfunction, obesity, and pain.    ACTIVITY LIMITATIONS carrying, lifting, squatting, stairs, transfers, reach over head, and locomotion level   PARTICIPATION LIMITATIONS: cleaning, laundry, community activity, and yard work   PERSONAL FACTORS Education, Fitness, Past/current experiences, Time since onset of injury/illness/exacerbation, and 1-2 comorbidities are also affecting patient's functional outcome.    REHAB POTENTIAL: Good   CLINICAL DECISION MAKING: Stable/uncomplicated   EVALUATION COMPLEXITY: Low     GOALS:    SHORT TERM GOALS: Target date: 07/25/2022   Pt will become independent with HEP in order to demonstrate synthesis of PT education.  Goal status: met   2.  Pt will score at least 5 pt increase on FOTO to demonstrate functional improvement in MCII and pt perceived function.    Goal status: PARTIALLY MET      LONG TERM GOALS: Target date: 10/10/2022     Pt  will become independent with final HEP in order to demonstrate synthesis of PT education.  Goal status: ongoing   2.   Pt will have an at least 45/56 on Berg Balance scale in order to demonstrate improvement above cut off score for risk of falls.   Goal status: GOAL MET   3.   Pt will be able to demonstrate gait speed of 1 m/s or greater in order to demonstrate functional improvement in LE function for prevention of falls.  Goal status:  GOAL MET   4.   Pt will score >/= 66 on FOTO to demonstrate improvement in perceived LE/balance function.  Goal status: ONGOING         PLAN: PT FREQUENCY: 1x/week for 2-3 more visits   PT DURATION:  4 weeks    PLANNED INTERVENTIONS: Therapeutic exercises, Therapeutic activity,  Neuromuscular re-education, Balance training, Gait training, Patient/Family education, Joint mobilization, Stair training, DME instructions, Aquatic Therapy, Dry Needling, Electrical stimulation, Cryotherapy, Moist heat, Taping, Ultrasound, Ionotophoresis 63m/ml Dexamethasone, Manual therapy, and Re-evaluation   PLAN FOR NEXT SESSION:  Pt to be seen for 2-3 more visits.  Assess LE strength and focus on HEP.    Daleen Bo PT, DPT 09/19/22 3:59 PM

## 2022-09-22 DIAGNOSIS — I10 Essential (primary) hypertension: Secondary | ICD-10-CM | POA: Diagnosis not present

## 2022-09-25 ENCOUNTER — Encounter (HOSPITAL_BASED_OUTPATIENT_CLINIC_OR_DEPARTMENT_OTHER): Payer: Medicare Other | Admitting: Physical Therapy

## 2022-09-26 DIAGNOSIS — Z23 Encounter for immunization: Secondary | ICD-10-CM | POA: Diagnosis not present

## 2022-10-03 ENCOUNTER — Encounter (HOSPITAL_BASED_OUTPATIENT_CLINIC_OR_DEPARTMENT_OTHER): Payer: Self-pay | Admitting: Physical Therapy

## 2022-10-03 ENCOUNTER — Ambulatory Visit (HOSPITAL_BASED_OUTPATIENT_CLINIC_OR_DEPARTMENT_OTHER): Payer: Medicare Other | Admitting: Physical Therapy

## 2022-10-03 DIAGNOSIS — M6281 Muscle weakness (generalized): Secondary | ICD-10-CM

## 2022-10-03 DIAGNOSIS — R2689 Other abnormalities of gait and mobility: Secondary | ICD-10-CM | POA: Diagnosis not present

## 2022-10-03 NOTE — Therapy (Signed)
OUTPATIENT PHYSICAL THERAPY LOWER EXTREMITY TREATMENT   PHYSICAL THERAPY DISCHARGE SUMMARY  Visits from Start of Care: 12  Plan: Patient agrees to discharge.  Patient goals were mostly met. Patient is being discharged due to meeting the stated rehab goals.          Patient Name: Dalton Cardenas MRN: [id number] DOB:[birth year], 89 y.o., male Today's Date: 10/03/2022   PT End of Session - 10/03/22 1629     Visit Number 12    Number of Visits 13    Date for PT Re-Evaluation 10/10/22    Authorization Type UHC MCR    PT Start Time 8546    PT Stop Time 1624    PT Time Calculation (min) 26 min    Activity Tolerance Patient tolerated treatment well    Behavior During Therapy [facility] for tasks assessed/performed                 Past Medical History:  Diagnosis Date   Coronary artery disease    60-70% circumflex (cath in [place] 08/2010).   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 Holter monitoring 2011 - AV node reentry, multifocal atrial tachycardia, and resting bradycardia. b. previously tx with Abana in [place].   Past Surgical History:  Procedure Laterality Date   ABLATION OF DYSRHYTHMIC FOCUS  10/13/2014   SVT         by Dr Jacquelyne Balint FISSURE REPAIR     CARDIOVERSION N/A 07/01/2020   Procedure: CARDIOVERSION;  Surgeon: Adrian Prows, MD;  Location: [facility] ENDOSCOPY;  Service: Cardiovascular;  Laterality: N/A;   CARDIOVERSION N/A 09/04/2022   Procedure: CARDIOVERSION;  Surgeon: Adrian Prows, MD;  Location: [place];  Service: Cardiovascular;  Laterality: N/A;   ELECTROPHYSIOLOGY STUDY N/A 10/13/2014   Procedure: ELECTROPHYSIOLOGY STUDY;  Surgeon: Evans Lance, MD;  Location: [facility] CATH LAB;  Service: Cardiovascular;  Laterality: N/A;   EYE SURGERY Bilateral 2011   cataract    PROSTATECTOMY     SUPRAVENTRICULAR TACHYCARDIA ABLATION N/A 10/13/2014   Procedure: SUPRAVENTRICULAR TACHYCARDIA ABLATION;  Surgeon: Evans Lance, MD;  Location: [facility] CATH LAB;  Service: Cardiovascular;  Laterality: N/A;   TRANSCAROTID ARTERY REVASCULARIZATION (TCAR) Left 08/01/2020   TRANSCAROTID ARTERY REVASCULARIZATION  Left 08/01/2020   Procedure: LEFT TRANSCAROTID ARTERY REVASCULARIZATION;  Surgeon: Elam Dutch, MD;  Location: [place];  Service: Vascular;  Laterality: Left;   ULTRASOUND GUIDANCE FOR VASCULAR ACCESS Right 08/01/2020   Procedure: ULTRASOUND GUIDANCE FOR VASCULAR ACCESS;  Surgeon: Elam Dutch, MD;  [facility] OR;  Service: Vascular;  Laterality: Right;   Patient Active Problem List   Diagnosis Date Noted   Presence of internal carotid stent (TCAR Left 2021) 02/07/2022   Primary osteoarthritis of left knee 05/01/2021   DVT (deep venous thrombosis) ([place]) 11/24/2020   Carotid stenosis 08/01/2020   Hyponatremia 06/30/2020   Atrial flutter ([place]) 06/30/2020   Chronic cough 11/26/2019   Neck pain 07/02/2018   SVT (supraventricular tachycardia) 10/13/2014   Syncope 07/22/2012   Coronary artery disease    CHEST PAIN UNSPECIFIED 04/13/2010   ADENOCARCINOMA, PROSTATE 12/12/2009   HYPERCHOLESTEROLEMIA  12/12/2009   Obstructive sleep apnea 12/12/2009   Essential hypertension, benign 12/12/2009   Supraventricular tachycardia 12/12/2009    PCP: Aretta Nip, MD  REFERRING PROVIDER: Leeroy Cha, MD  REFERRING DIAG: R26.9 (ICD-10-CM) - Unspecified abnormalities of gait and mobility  THERAPY DIAG:  Other abnormalities of gait and mobility  Muscle weakness (generalized)  Rationale for Evaluation and Treatment Rehabilitation  ONSET DATE: 11/2021  SUBJECTIVE:   SUBJECTIVE STATEMENT:  Pt states he feels similar to  last session. No significant changes. He has not been able to join a gym just yet. The hips are occasionally stiff.  Previous:  Pt was having some bloating and difficulty breathing.  Pt saw his cardiologist and had an EKG.  Pt was informed he had CHF and MD performed cardioversion on 09/04/22.  Pt had 48 hour restrictions after cardioversion.  Pt spoke to cardiologist yesterday who cleared him to return to PT and stated he doesn't have any restrictions.   Pt reports improved balance and performing sit to stand transfers.  He feels that he needs increased strength in legs.  He has minimal difficulty getting up from lower chairs.  Pt wants to continue with PT and improve LE strength.  Pt denies pain in hip and states he still has stiffness.  Pt denies any falls.  Pt states he has not performed his exercises this past week due to A-fib and cardioversion though was performing prior.    PERTINENT HISTORY: CAD, prostate cancer, syncope, SVT, L shoulder RC injury, A-fib with hx of ablation, and Hx of DVT in 2022  PAIN:  Are you having pain? No: NPRS scale: 0/10 Pain location: L hip Pain description: aching   PRECAUTIONS: None  WEIGHT BEARING RESTRICTIONS No  FALLS:  Has patient fallen in last 6 months? Yes. 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    OBJECTIVE:   DIAGNOSTIC FINDINGS: N/A  FOTO 1/17  60    TODAY'S TREATMENT:  Verbal review of HEP, return to PT instructions, compensatory strategies, self exercise progression and independence  MMT Right 1/17 Left 1/17  Hip flexion 4+ 4+  Hip extension    Hip abduction 4+ 4+  Hip adduction 5 5  Hip internal  rotation    Hip external rotation    Knee flexion 4+ 4+  Knee extension 4+ 4+   (Blank rows = not tested)   FUNCTIONAL TESTS:  -5 times sit to stand: Initial/Current:  22.4s  / 12.3      -BERG Balance Test                       Sit to Stand 4  Standing unsupported 4  Sitting with back unsupported but feet supported 4  Stand to sit  4  Transfers  4  Standing unsupported with eyes closed 4  Standing unsupported feet together 4  From standing position, reach forward with outstretched arm 3  From standing position, pick up object from floor 4  From standing position, turn and look behind over each shoulder 4  Turn 360 4  Standing unsupported, alternately place foot on step 4  Standing unsupported, one foot in front 3  Standing on one leg 3  Total:  53/56        PATIENT EDUCATION:  Education details:   anatomy, exercise progression,  HEP, POC  Person educated: Patient Education method: Explanation, Demonstration, verbal cues Education comprehension: verbalized understanding, verbal cues required, and returned demonstration     HOME EXERCISE PROGRAM:  Access Code: [email or web address] URL: [email or web address] Date: 06/13/2022 Prepared by: Daleen Bo   ASSESSMENT:   CLINICAL IMPRESSION: Pt has met most functional goals with PT and has exceeded objective outcome measure cut off for balance deficits. Pt does not appear to be at risk for falls but does currently have L hip and L shoulder orthopedic issues that could be address with future POC. Currently, pt does not have significant balance deficits and is safe for community ambulation and activity. D/C this episode of care. Potential for orthopedic referral incoming after PCP visit in Feb 2024.   OBJECTIVE IMPAIRMENTS decreased balance, decreased coordination, decreased endurance, decreased knowledge of use of DME, decreased mobility, difficulty walking, decreased strength, decreased safety awareness, impaired sensation,  impaired UE functional use, improper body mechanics, postural dysfunction, obesity, and pain.    ACTIVITY LIMITATIONS carrying, lifting, squatting, stairs, transfers, reach over head, and locomotion level   PARTICIPATION LIMITATIONS: cleaning, laundry, community activity, and yard work   PERSONAL FACTORS Education, Fitness, Past/current experiences, Time since onset of injury/illness/exacerbation, and 1-2 comorbidities are also affecting patient's functional outcome.    REHAB POTENTIAL: Good   CLINICAL DECISION MAKING: Stable/uncomplicated   EVALUATION COMPLEXITY: Low     GOALS:    SHORT TERM GOALS: Target date: 07/25/2022   Pt will become independent with HEP in order to demonstrate synthesis of PT education.  Goal status: met   2.  Pt will score at least 5 pt increase on FOTO to demonstrate functional improvement in MCII and pt perceived function.    Goal status: PARTIALLY MET      LONG TERM GOALS: Target date: 10/10/2022     Pt  will become independent with final HEP in order to demonstrate synthesis of PT education.  Goal status: MET   2.   Pt will have an at least 45/56 on Berg Balance scale in order to demonstrate improvement above cut off score for risk of falls.   Goal status: GOAL MET   3.   Pt will be able to demonstrate gait speed of 1 m/s or greater in order to demonstrate functional improvement in LE function for prevention of falls.  Goal status:  GOAL MET   4.   Pt will score >/= 66 on FOTO to demonstrate improvement in perceived LE/balance function.  Goal status: ONGOING         PLAN: PT FREQUENCY: 1x/week for 2-3 more visits   PT DURATION:  4 weeks    PLANNED INTERVENTIONS: Therapeutic exercises, Therapeutic activity, Neuromuscular re-education, Balance training, Gait training, Patient/Family education, Joint mobilization, Stair training, DME instructions, Aquatic Therapy, Dry Needling, Electrical stimulation, Cryotherapy, Moist heat, Taping,  Ultrasound, Ionotophoresis '4mg'$ /ml Dexamethasone, Manual therapy, and Re-evaluation     Daleen Bo PT, DPT 10/03/22 4:33 PM

## 2022-10-10 ENCOUNTER — Encounter (HOSPITAL_BASED_OUTPATIENT_CLINIC_OR_DEPARTMENT_OTHER): Payer: Medicare Other | Admitting: Physical Therapy

## 2022-10-15 ENCOUNTER — Telehealth: Payer: Self-pay

## 2022-10-15 ENCOUNTER — Encounter: Payer: Self-pay | Admitting: Cardiology

## 2022-10-15 ENCOUNTER — Ambulatory Visit: Payer: Medicare Other | Admitting: Cardiology

## 2022-10-15 VITALS — BP 130/64 | HR 63 | Resp 16 | Ht 67.0 in | Wt 161.8 lb

## 2022-10-15 DIAGNOSIS — I6522 Occlusion and stenosis of left carotid artery: Secondary | ICD-10-CM

## 2022-10-15 DIAGNOSIS — I10 Essential (primary) hypertension: Secondary | ICD-10-CM | POA: Diagnosis not present

## 2022-10-15 DIAGNOSIS — I480 Paroxysmal atrial fibrillation: Secondary | ICD-10-CM | POA: Diagnosis not present

## 2022-10-15 DIAGNOSIS — I5032 Chronic diastolic (congestive) heart failure: Secondary | ICD-10-CM

## 2022-10-15 NOTE — Progress Notes (Signed)
Primary Physician/Referring:  Leeroy Cha, MD  Patient ID: Dalton Cardenas, male    DOB: [birth year], 89 y.o.   MRN: [id number]  Chief Complaint  Patient presents with   Paroxysmal atrial fibrillation   Essential hypertension   Follow-up    6 weeks   HPI:    ABEER IVERSEN  is a 89 y.o. 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  He was seen on 20 1823 with acute decompensated diastolic heart failure with leg edema worsening dyspnea and fatigue.  He was found to be in atrial fibrillation. He underwent direct-current cardioversion on 09/04/2022, now presents for follow-up.   Although symptoms of heart failure completely resolved with improvement in his dyspnea, fatigue, leg edema has resolved.  Past Medical History:  Diagnosis Date   Coronary artery disease    60-70% circumflex (cath in [place] 08/2010).   Dysrhythmia    A history of A flutter and tachycardia (SVT)   GERD (gastroesophageal reflux disease)    Hypercholesterolemia    Hypertension    Prostate cancer ([place])    status post prostatectomy   Sleep apnea    Stenosis of subclavian artery ([place]) 06/30/2020    Severe stenosis of the proximal right subclavian artery    SVT (supraventricular tachycardia)    a. Holter monitoring 2011 - AV node reentry, multifocal atrial tachycardia, and resting bradycardia. b. previously tx with [facility] in [place].    Social History   Tobacco Use   Smoking status: Never   Smokeless tobacco: Never  Substance Use Topics   Alcohol use: No   ROS  Review of Systems  Constitutional: Negative for malaise/fatigue.  Cardiovascular:  Positive for palpitations. Negative for chest pain, dyspnea on exertion and leg swelling.    Objective   Blood pressure 130/64, pulse 63, resp. rate 16, height '5\' 7"'$  (1.702 m), weight 161 lb 12.8 oz (73.4 kg), SpO2 99 %.     10/15/2022   11:35 AM 09/04/2022    1:00 PM 09/04/2022   12:50 PM  Vitals with BMI  Height '5\' 7"'$     Weight 161 lbs 13 oz    BMI 08.65    Systolic 784 696 295  Diastolic 64 68 65  Pulse 63 55 51     Physical Exam Vitals reviewed.  Neck:     Vascular: JVD present. Carotid bruit: soft bilateral. Cardiovascular:     Rate and Rhythm: Normal rate. Rhythm irregular.     Pulses: Intact distal pulses.     Heart sounds: S1 normal and S2 normal. Murmur heard.     Harsh midsystolic murmur is present with a grade of 2/6 at the upper right sternal border.     Early diastolic murmur is present with a grade of 2/4 at the upper right sternal border radiating to the apex.     No gallop.  Pulmonary:     Effort: Pulmonary effort is normal. No respiratory distress.     Breath sounds: No wheezing, rhonchi or rales.  Abdominal:     General: Bowel sounds are normal.     Palpations: Abdomen is soft.  Musculoskeletal:     Right lower leg: Edema (trace) present.     Left lower leg: Edema (trace) present.    Laboratory examination:   Lab Results  Component Value Date   NA 138 09/03/2022   K 4.4 09/03/2022   CO2 20 09/03/2022   GLUCOSE 121 (H) 09/03/2022   BUN 20 09/03/2022   CREATININE 1.10 09/03/2022   CALCIUM 9.4 09/03/2022   EGFR 65 09/03/2022   GFRNONAA >60 03/15/2021       Latest Ref Rng & Units 09/03/2022    1:12 PM 03/15/2021   12:23 AM 11/08/2020    2:17 PM  CMP  Glucose 70 - 99 mg/dL 121  210  102   BUN 8 - 27 mg/dL '20  17  20   '$ Creatinine 0.76 - 1.27 mg/dL 1.10  1.03  1.04   Sodium 134 - 144 mmol/L 138  135  136   Potassium 3.5 - 5.2 mmol/L 4.4  3.5  4.8   Chloride 96 - 106 mmol/L 102  102  104   CO2 20 - 29 mmol/L '20  24  24   '$ Calcium 8.6 - 10.2 mg/dL 9.4  9.5  9.4   Total Protein 6.5 - 8.1 g/dL   7.5   Total Bilirubin 0.3 - 1.2 mg/dL   0.9   Alkaline  Phos 38 - 126 U/L   71   AST 15 - 41 U/L   23   ALT 0 - 44 U/L   21       Latest Ref Rng & Units 09/03/2022    1:12 PM 03/15/2021   12:23 AM 11/08/2020    2:17 PM  CBC  WBC 3.4 - 10.8 x10E3/uL 8.6  10.8  8.2   Hemoglobin 13.0 - 17.7 g/dL 12.0  14.0  11.8   Hematocrit 37.5 - 51.0 % 37.0  41.0  36.1   Platelets 150 - 450 x10E3/uL 295  243  267    External labs :   Cholesterol, total 195.000 m 05/15/2022 HDL 51.000 mg 05/15/2022 LDL 98.000 mg 05/15/2022 Triglycerides 271.000 m 05/15/2022  A1C 6.100 % 05/15/2022  Labs 03/24/2012: TSH normal at 3.612.  Allergies   Allergies  Allergen Reactions   Amoxicillin Diarrhea    GI upset: Bleeding diarrhea    Ibuprofen Diarrhea    Bloody diarrhea    Penicillins Diarrhea    Bloody diarrhea    Ace Inhibitors Swelling    Face swelling    Augmentin [Amoxicillin-Pot Clavulanate] Diarrhea   Doxycycline Hyclate Diarrhea   Spironolactone     Hyponatremia    Atorvastatin     Weakness    Azithromycin     Stomach ache    Diltiazem Nausea Only    Upset stomach per patient   Hydrochlorothiazide Other (See Comments)    Photosensitivity    Final Medications at End of Visit     Current Outpatient Medications:    acebutolol (SECTRAL) 200 MG capsule, Take 1 capsule (200 mg total) by mouth 2 (two) times daily., Disp: 180 capsule, Rfl: 3   amLODipine (NORVASC) 5 MG tablet, Take 1 tablet (5 mg total) by mouth daily., Disp: 90 tablet, Rfl: 3   apixaban (ELIQUIS) 2.5 MG TABS tablet, Take 1 tablet (2.5 mg total) by mouth 2 (two) times daily., Disp: 180 tablet, Rfl: 3   b complex vitamins tablet, Take 1 tablet by mouth daily. , Disp: , Rfl:    Cholecalciferol (VITAMIN D) 125 MCG (5000 UT) CAPS, Take 5,000 Units by mouth daily. , Disp: , Rfl:    CHROMIUM PO, Take 1 tablet by mouth daily., Disp: , Rfl:    Coenzyme Q10 (CO Q-10) 200  MG CAPS, Take 200 mg by mouth daily. , Disp: , Rfl:    Cyanocobalamin (B-12) 5000 MCG CAPS, Take 5,000 mcg by mouth  daily., Disp: , Rfl:    folic acid (FOLVITE) 818 MCG tablet, Take 800 mcg by mouth daily., Disp: , Rfl:    hydrALAZINE (APRESOLINE) 100 MG tablet, Take 1 (100 mg) tablet by mouth every morning and evening. Take 1 (50 mg) tablet daily in the afternoon. (Patient taking differently: Take 100 mg by mouth at bedtime.), Disp: 180 tablet, Rfl: 3   hydrALAZINE (APRESOLINE) 50 MG tablet, Take 1 (100 mg) tablet by mouth every morning and evening. Take 1 (50 mg) tablet daily in the afternoon. (Patient taking differently: Take 50 mg by mouth every morning.), Disp: 90 tablet, Rfl: 3   L-Arginine 500 MG CAPS, Take 500 mg by mouth daily., Disp: , Rfl:    LUTEIN PO, Take 1 tablet by mouth daily. , Disp: , Rfl:    Magnesium 200 MG TABS, Take 400 mg by mouth daily., Disp: , Rfl:    melatonin 5 MG TABS, Take 5 mg by mouth at bedtime as needed (sleep)., Disp: , Rfl:    Menaquinone-7 (K2 PO), Take by mouth daily at 6 (six) AM., Disp: , Rfl:    Probiotic Product (ALIGN PO), Take 1 capsule by mouth daily. , Disp: , Rfl:    rosuvastatin (CRESTOR) 10 MG tablet, Take 1 tablet (10 mg total) by mouth daily., Disp: 90 tablet, Rfl: 3   selenium 50 MCG TABS tablet, Take 50 mcg by mouth daily., Disp: , Rfl:    valsartan (DIOVAN) 160 MG tablet, Take 1 tablet (160 mg total) by mouth daily., Disp: 90 tablet, Rfl: 3   Vitamin A 2400 MCG (8000 UT) CAPS, Take 8,000 Units by mouth daily., Disp: , Rfl:    vitamin C (ASCORBIC ACID) 500 MG tablet, Take 500 mg by mouth daily., Disp: , Rfl:    zinc gluconate 50 MG tablet, Take 50 mg by mouth daily., Disp: , Rfl:    Radiology:   Chest x-ray 06/30/2020: Lungs are clear.  No pleural effusion or pneumothorax. The heart is normal in size.  Thoracic aortic atherosclerosis. Degenerative changes of the visualized thoracolumbar spine.  CT angiogram chest 11/11/2020: 1. No demonstrable pulmonary embolus. No thoracic aortic aneurysm or dissection. There are foci of aortic atherosclerosis as well  as foci of great vessel and coronary artery calcification.   2. Mild atelectatic change in the left lower lobe and inferior lingula. No edema or airspace opacity. Multiple 1-2 mm nodular opacities noted in each upper lobe. No larger pulmonary nodular opacities evident. No follow-up needed if patient is low-risk (and has no known or suspected primary neoplasm). Non-contrast chest CT can be considered in 12 months if patient is high-risk.  Cardiac Studies:  Coronary Angiogram   [2011]: Performed in [place], 70% stenosis in the circumflex coronary artery. Was performed as a part of workup for EP study for the SVT.  CT angio head W or WO Contrast 07/12/2020:  No acute intracranial abnormality. Mild chronic microvascular ischemic changes. Chronic right caudate infarct. Short segment marked stenosis of the distal intracranial right vertebral artery.  Carotid artery duplex 08/02/2022: Right Carotid: Velocities in the right ICA are consistent with a 1-39% stenosis.  Left Carotid: Patent stent with no evidence for restenosis. The ECA  appears >50% stenosed.  Vertebrals:  Left vertebral artery demonstrates antegrade flow. 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 Normal flow  hemodynamics were seen in the left subclavian artery.   PCV ECHOCARDIOGRAM COMPLETE 02/23/2022  Normal LV systolic function with visual EF 60-65%. Left ventricle cavity is normal in size. Moderate to severe left ventricular hypertrophy, presence of a septal bulge. Systolic motion of the anterior mitral valve leaflet (SAM).  Normal global wall motion. Unable to accurately evaluate diastolic function due to mitral annular calcification; however, doppler parameters suggestive of grade 2 diastolic dysfunction and elevated LAP. Aortic valve sclerosis without stenosis. Mild to moderate aortic regurgitation. Mild (Grade I) mitral  regurgitation. Moderate calcification of the mitral valve annulus. Moderate tricuspid regurgitation. Mild pulmonary hypertension. RVSP measures 39 mmHg. Mild pulmonic regurgitation. Compared to 06/03/2020 Moderate AR is now mild/moderate, moderate MR is now mild, mild PHTN (RVSP 22mHG compared to before 382mG) / SAM / dilated coronary sinus are new findings.  Zio Patch Extended out patient EKG monitoring 13 days starting 07/03/2022:   Dominant rhythm Sinus.  First degree AV block was present. Ventricular bigeminy was present, longest episode 4.6 seconds. HR 39-92 bpm. Avg HR 55 bpm. No atrial fibrillation/atrial flutter/SVT/VT/high grade AV block, sinus pause >3sec noted. 14 runs of probable atrial tachycardia with fastest interval lasting 6 beats with a max heart rate of 113 bpm. Isolated SVE <1.0%, SVE Couplets <1.0%, SVE Triplets <1.0% Isolated VE <1%, VE Couplets <1.0%, VE Triplets <1.0% Symptoms reported: None  EKG:    EKG 10/15/2022: Sinus rhythm with first-degree AV block at the rate of 57 bpm, left atrial enlargement, left axis deviation, left anterior fascicular block.  Poor R progression, cannot exclude anteroseptal infarct old.  LVH.  Nonspecific T abnormality.  Suspect lead reversal of 1 and aVL.  Compared to 09/03/2022, atrial fibrillation no longer present, lead reversal is new.  Compared to 07/16/2022, no change including heart rate.  EKG 09/03/2022: Atrial fibrillation with controlled ventricular response at the rate of 58 bpm, left axis deviation, left anterior fascicular block.  Incomplete right bundle branch block.  Poor R progression, probably normal variant.  Nonspecific T abnormality.  Compared to 06/19/2021, sinus with first-degree AV block has now been replaced by atrial fibrillation otherwise no change.  Assessment     ICD-10-CM   1. Paroxysmal atrial fibrillation (HCC)  I48.0 EKG 12-Lead    PCV ECHOCARDIOGRAM COMPLETE    2. Chronic diastolic (congestive) heart  failure (HCC)  I50.32 PCV ECHOCARDIOGRAM COMPLETE    3. Essential hypertension, benign  I10     4. Carotid stenosis, left  I65.22       CHA2DS2-VASc Score is 4.  Yearly risk of stroke: 4.8% (A, HTN, Vasc Dz).  Score of 1=0.6; 2=2.2; 3=3.2; 4=4.8; 5=7.2; 6=9.8; 7=>9.8) -(CHF; HTN; vasc disease DM,  Male = 1; Age <65 =0; 65-74 = 1,  >75 =2; stroke/embolism= 2).    No orders of the defined types were placed in this encounter.  Medications Discontinued During This Encounter  Medication Reason   potassium chloride (KLOR-CON) 10 MEQ tablet    furosemide (LASIX) 40 MG tablet     Recommendations:   ShKODEE RAVERTis [place] [age]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  He was seen on 20 1823 with acute decompensated diastolic heart failure with leg edema worsening dyspnea and fatigue.  He was found to be in atrial fibrillation. He underwent direct-current cardioversion on 09/04/2022, now presents for follow-up.   1. Paroxysmal atrial fibrillation ([place]) Patient is presently on acebutolol, he is maintaining sinus rhythm, has underlying first-degree AV block and left axis deviation and left anterior fascicular block.  He also has bradycardia but remains asymptomatic and is tolerating the beta-blocker well.  If he were to have recurrence of atrial fibrillation, Multaq would be a great choice as he was symptomatic.  Since cardioversion, symptoms of heart failure completely resolved.  He does have chronic diastolic heart failure, will continue present management.  2. Essential hypertension, benign Blood pressure is under excellent control, he is presently on RPM, I reviewed the data, I did not make any changes to his medications.  3. Carotid  stenosis, left I reviewed the results of the carotid artery duplex, he has widely patent TAVR site and he has very minimal disease in the carotids, has external carotid disease.  He also has right subclavian stenosis but is clinically asymptomatic, hence continue present medical therapy.  He is presently on Eliquis for anticoagulation hence not on aspirin.  I would like to see him back in 6 months, would like to repeat echocardiogram to evaluate his aortic valve disease and also to look for LV systolic function.  Note RPM: Patient's home BP is controlled on current antihypertensive regimen.    Average Systolic BP Level 242.6 mmHg Lowest Systolic BP Level 96 mmHg Highest Systolic BP Level 834 mmHg   Average Diastolic BP Level 19.62 mmHg Lowest Diastolic BP Level 54 mmHg Highest Diastolic BP Level 89 mmHg   Average Pulse Level 52.65 BPM Lowest Pulse Level 48 BPM Highest Pulse Level 60 BPM   10/14/2022 'Sunday at 12:45 PM        112 / 54                10/14/2022 Sunday at 12:44 PM        115 / 54                10/14/2022 Sunday at 12:43 PM        116 / 54                10/12/2022 Friday at 12:25 PM          137 / 64                10/12/2022 Friday at 12:23 PM          141 / 66                10/12/2022 Friday at 12:21 PM          14'$ 4 / 66     Adrian Prows, MD, [facility] 10/15/2022, 12:06 PM Office: [phone] Fax: [phone] Pager: [phone]

## 2022-10-15 NOTE — Telephone Encounter (Signed)
Patient home BP is overall controlled on current antihypertensive regimen.  Average Systolic BP Level 175.1 mmHg Lowest Systolic BP Level 96 mmHg Highest Systolic BP Level 025 mmHg  Average Diastolic BP Level 85.27 mmHg Lowest Diastolic BP Level 54 mmHg Highest Diastolic BP Level 89 mmHg  Average Pulse Level 52.65 BPM Lowest Pulse Level 48 BPM Highest Pulse Level 60 BPM  10/14/2022 'Sunday at 12:45 PM 112 / 54      10/14/2022 Sunday at 12:44 PM 115 / 54      10/14/2022 Sunday at 12:43 PM 116 / 54      10/12/2022 Friday at 12:25 PM 137 / 64      10/12/2022 Friday at 12:23 PM 141 / 66      10/12/2022 Friday at 12:21 PM 144 / 66      10/10/2022 Wednesday at 11:39 AM 118 / 63      10/10/2022 Wednesday at 11:37 AM 119 / 63      10/10/2022 Wednesday at 11:36 AM 118 / 61      01'$ /23/2024 Tuesday at 02:07 PM 131 / 66

## 2022-10-15 NOTE — Telephone Encounter (Signed)
Blood pressure well-controlled, continue present medical therapy.

## 2022-10-22 DIAGNOSIS — I10 Essential (primary) hypertension: Secondary | ICD-10-CM | POA: Diagnosis not present

## 2022-10-22 DIAGNOSIS — R7309 Other abnormal glucose: Secondary | ICD-10-CM | POA: Diagnosis not present

## 2022-10-22 DIAGNOSIS — I4892 Unspecified atrial flutter: Secondary | ICD-10-CM | POA: Diagnosis not present

## 2022-10-22 DIAGNOSIS — I82402 Acute embolism and thrombosis of unspecified deep veins of left lower extremity: Secondary | ICD-10-CM | POA: Diagnosis not present

## 2022-10-22 DIAGNOSIS — I700 Atherosclerosis of aorta: Secondary | ICD-10-CM | POA: Diagnosis not present

## 2022-10-22 DIAGNOSIS — I872 Venous insufficiency (chronic) (peripheral): Secondary | ICD-10-CM | POA: Diagnosis not present

## 2022-10-22 DIAGNOSIS — D6859 Other primary thrombophilia: Secondary | ICD-10-CM | POA: Diagnosis not present

## 2022-10-22 DIAGNOSIS — K219 Gastro-esophageal reflux disease without esophagitis: Secondary | ICD-10-CM | POA: Diagnosis not present

## 2022-10-22 DIAGNOSIS — I739 Peripheral vascular disease, unspecified: Secondary | ICD-10-CM | POA: Diagnosis not present

## 2022-10-22 DIAGNOSIS — Z0000 Encounter for general adult medical examination without abnormal findings: Secondary | ICD-10-CM | POA: Diagnosis not present

## 2022-10-22 DIAGNOSIS — I779 Disorder of arteries and arterioles, unspecified: Secondary | ICD-10-CM | POA: Diagnosis not present

## 2022-10-22 DIAGNOSIS — Z23 Encounter for immunization: Secondary | ICD-10-CM | POA: Diagnosis not present

## 2022-10-22 DIAGNOSIS — I5032 Chronic diastolic (congestive) heart failure: Secondary | ICD-10-CM | POA: Diagnosis not present

## 2022-10-22 DIAGNOSIS — I110 Hypertensive heart disease with heart failure: Secondary | ICD-10-CM | POA: Diagnosis not present

## 2022-10-23 DIAGNOSIS — I10 Essential (primary) hypertension: Secondary | ICD-10-CM | POA: Diagnosis not present

## 2022-10-24 ENCOUNTER — Ambulatory Visit: Payer: Self-pay

## 2022-10-24 NOTE — Patient Outreach (Signed)
  Care Coordination   Follow Up Visit Note   10/24/2022 Name: Dalton Cardenas MRN: [id number] DOB: [birth year]  Dalton Cardenas is a 89 y.o. year old male who sees Dalton Cha, MD for primary care. I spoke with  Dalton Cardenas by phone today.  What matters to the patients health and wellness today?  I have been feeling good and got a good check up from my doctor States his B/P ranges 120-130/55-65 and his pulse is around 50-55.  Denies any dizziness or chest pains    Goals Addressed             This Visit's Progress    Care Coordination Activities       Care Coordination Interventions: Provided education to patient re: reinforced s/sx of A Fib to call provider Discussed plans with patient for ongoing care management follow up and provided patient with direct contact information for care management team Counseled on increased risk of stroke due to Afib and benefits of anticoagulation for stroke prevention Reviewed importance of adherence to anticoagulant exactly as prescribed Advised patient to discuss statin use with provider Reviewed to call provider if has symptoms of lower heart rate          SDOH assessments and interventions completed:  Yes  SDOH Interventions Today    Flowsheet Row Most Recent Value  SDOH Interventions   Physical Activity Interventions Other (Comments)  [reviewed insurance benefit for fitness]        Care Coordination Interventions:  Yes, provided  Interventions Today    Flowsheet Row Most Recent Value  Chronic Disease Discussed/Reviewed   Chronic disease discussed/reviewed during today's visit Atrial Fibrillation (AFib)  General Interventions   General Interventions Discussed/Reviewed Lipid Profile  Exercise Interventions   Exercise Discussed/Reviewed Exercise Discussed  Education Interventions   Education Provided Provided Verbal Education  Provided Verbal Education On Nutrition, Exercise, When to see the doctor  Nutrition  Interventions   Nutrition Discussed/Reviewed Decreasing salt       Follow up plan: Follow up call scheduled for 12/24/22    Encounter Outcome:  Pt. Visit Completed  Dalton Garter RN, [facility], [place] Management [phone]

## 2022-10-24 NOTE — Patient Instructions (Signed)
Visit Information  Thank you for taking time to visit with me today. Please don't hesitate to contact me if I can be of assistance to you.   Following are the goals we discussed today:   Goals Addressed             This Visit's Progress    Care Coordination Activities       Care Coordination Interventions: Provided education to patient re: reinforced s/sx of A Fib to call provider Discussed plans with patient for ongoing care management follow up and provided patient with direct contact information for care management team Counseled on increased risk of stroke due to Afib and benefits of anticoagulation for stroke prevention Reviewed importance of adherence to anticoagulant exactly as prescribed Advised patient to discuss statin use with provider Reviewed to call provider if has symptoms of lower heart rate          Our next appointment is by telephone on 12/24/22 at 1 PM  Please call the care guide team at [phone] if you need to cancel or reschedule your appointment.   If you are experiencing a Mental Health or [place] or need someone to talk to, please call the Suicide and Crisis Lifeline: 988 call the [place] National Suicide Prevention Lifeline: 8301632099 or TTY: 615-348-4478 TTY 251-502-7517) to talk to a trained counselor call 1-800-273-TALK (toll free, 24 hour hotline) go to [facility] [address], [place] [phone]) call 911   Patient verbalizes understanding of instructions and care plan provided today and agrees to view in [place]. Active MyChart status and patient understanding of how to access instructions and care plan via MyChart confirmed with patient.     Telephone follow up appointment with care management team member scheduled for:12/24/22  Peter Garter RN, [facility], CDE Care Management Coordinator [place] Management [phone]

## 2022-10-26 DIAGNOSIS — E782 Mixed hyperlipidemia: Secondary | ICD-10-CM | POA: Diagnosis not present

## 2022-10-26 DIAGNOSIS — K219 Gastro-esophageal reflux disease without esophagitis: Secondary | ICD-10-CM | POA: Diagnosis not present

## 2022-10-26 DIAGNOSIS — I10 Essential (primary) hypertension: Secondary | ICD-10-CM | POA: Diagnosis not present

## 2022-10-26 DIAGNOSIS — R7303 Prediabetes: Secondary | ICD-10-CM | POA: Diagnosis not present

## 2022-11-21 ENCOUNTER — Other Ambulatory Visit: Payer: Self-pay

## 2022-11-21 MED ORDER — AMLODIPINE BESYLATE 5 MG PO TABS: 5.0000 mg | ORAL_TABLET | Freq: Every day | ORAL | 3 refills | Status: DC

## 2022-11-22 DIAGNOSIS — I10 Essential (primary) hypertension: Secondary | ICD-10-CM | POA: Diagnosis not present

## 2022-12-06 ENCOUNTER — Other Ambulatory Visit: Payer: Self-pay

## 2022-12-06 DIAGNOSIS — I10 Essential (primary) hypertension: Secondary | ICD-10-CM

## 2022-12-06 MED ORDER — HYDRALAZINE HCL 50 MG PO TABS: ORAL_TABLET | ORAL | 3 refills | Status: DC

## 2022-12-06 MED ORDER — HYDRALAZINE HCL 100 MG PO TABS: ORAL_TABLET | ORAL | 3 refills | Status: DC

## 2022-12-06 MED ORDER — ACEBUTOLOL HCL 200 MG PO CAPS: 200.0000 mg | ORAL_CAPSULE | Freq: Two times a day (BID) | ORAL | 1 refills | Status: DC

## 2022-12-22 DIAGNOSIS — I10 Essential (primary) hypertension: Secondary | ICD-10-CM | POA: Diagnosis not present

## 2022-12-24 ENCOUNTER — Ambulatory Visit: Payer: Self-pay

## 2022-12-24 NOTE — Patient Outreach (Signed)
  Care Coordination   Follow Up Visit Note   12/24/2022 Name: Dalton Cardenas MRN: [id number] DOB: 12-03-1933  Dalton Cardenas is a 89 y.o. year old male who sees Dalton Ishihara, MD for primary care. I spoke with  Dalton Cardenas by phone today.  What matters to the patients health and wellness today?  States he has been feeling good. States he has been trying to walk for exercise.  States he is trying to follow a healthy diet.  States he has constipation sometimes    Goals Addressed             This Visit's Progress    Care Coordination Activities        Interventions Today    Flowsheet Row Most Recent Value  Chronic Disease   Chronic disease during today's visit Atrial Fibrillation (AFib)  General Interventions   General Interventions Discussed/Reviewed General Interventions Reviewed, Doctor Visits  Doctor Visits Discussed/Reviewed Doctor Visits Reviewed, Annual Wellness Visits, PCP, Specialist  PCP/Specialist Visits Compliance with follow-up visit  Exercise Interventions   Exercise Discussed/Reviewed Exercise Reviewed, Physical Activity  Physical Activity Discussed/Reviewed Physical Activity Reviewed  [Reviewed to increase activity as tolerated]  Education Interventions   Education Provided Provided Education  Provided Verbal Education On When to see the doctor, Medication, Exercise, Nutrition  Nutrition Interventions   Nutrition Discussed/Reviewed Nutrition Reviewed, Decreasing salt, Adding fruits and vegetables  [Reviewed to increase fiber and drink more fluids to help with constipation]  Pharmacy Interventions   Pharmacy Dicussed/Reviewed Pharmacy Topics Reviewed, Medications and their functions              SDOH assessments and interventions completed:  No     Care Coordination Interventions:  Yes, provided   Follow up plan: Follow up call scheduled for 01/22/23    Encounter Outcome:  Pt. Visit Completed  Dudley Major RN, [facility], CDE Care  Management Coordinator [facility] Care Management [phone]

## 2022-12-24 NOTE — Patient Instructions (Signed)
Visit Information  Thank you for taking time to visit with me today. Please don't hesitate to contact me if I can be of assistance to you.   Following are the goals we discussed today:   Goals Addressed             This Visit's Progress    Care Coordination Activities        Interventions Today    Flowsheet Row Most Recent Value  Chronic Disease   Chronic disease during today's visit Atrial Fibrillation (AFib)  General Interventions   General Interventions Discussed/Reviewed General Interventions Reviewed, Doctor Visits  Doctor Visits Discussed/Reviewed Doctor Visits Reviewed, Annual Wellness Visits, PCP, Specialist  PCP/Specialist Visits Compliance with follow-up visit  Exercise Interventions   Exercise Discussed/Reviewed Exercise Reviewed, Physical Activity  Physical Activity Discussed/Reviewed Physical Activity Reviewed  [Reviewed to increase activity as tolerated]  Education Interventions   Education Provided Provided Education  Provided Verbal Education On When to see the doctor, Medication, Exercise, Nutrition  Nutrition Interventions   Nutrition Discussed/Reviewed Nutrition Reviewed, Decreasing salt, Adding fruits and vegetables  [Reviewed to increase fiber and drink more fluids to help with constipation]  Pharmacy Interventions   Pharmacy Dicussed/Reviewed Pharmacy Topics Reviewed, Medications and their functions              Our next appointment is by telephone on 01/22/23 at 1 PM  Please call the care guide team at [phone] if you need to cancel or reschedule your appointment.   If you are experiencing a Mental Health or Behavioral Health Crisis or need someone to talk to, please call the Suicide and Crisis Lifeline: 988 call the [place] National Suicide Prevention Lifeline: 802-604-0033 or TTY: 367-640-3627 TTY (908)880-3437) to talk to a trained counselor call 1-800-273-TALK (toll free, 24 hour hotline) go to [facility]  [address], [place] [phone]) call 911   Patient verbalizes understanding of instructions and care plan provided today and agrees to view in MyChart. Active MyChart status and patient understanding of how to access instructions and care plan via MyChart confirmed with patient.     Telephone follow up appointment with care management team member scheduled for: 01/22/23  SIGNATURE Dudley Major RN, Maximiano Coss, CDE Care Management Coordinator [facility] Care Management [phone]

## 2023-01-04 ENCOUNTER — Encounter (INDEPENDENT_AMBULATORY_CARE_PROVIDER_SITE_OTHER): Payer: Medicare Other | Admitting: Ophthalmology

## 2023-01-04 DIAGNOSIS — H43811 Vitreous degeneration, right eye: Secondary | ICD-10-CM | POA: Diagnosis not present

## 2023-01-04 DIAGNOSIS — H33303 Unspecified retinal break, bilateral: Secondary | ICD-10-CM

## 2023-01-04 DIAGNOSIS — H35371 Puckering of macula, right eye: Secondary | ICD-10-CM | POA: Diagnosis not present

## 2023-01-04 DIAGNOSIS — H35033 Hypertensive retinopathy, bilateral: Secondary | ICD-10-CM | POA: Diagnosis not present

## 2023-01-04 DIAGNOSIS — I10 Essential (primary) hypertension: Secondary | ICD-10-CM

## 2023-01-11 ENCOUNTER — Other Ambulatory Visit: Payer: Self-pay

## 2023-01-11 DIAGNOSIS — I10 Essential (primary) hypertension: Secondary | ICD-10-CM

## 2023-01-11 MED ORDER — VALSARTAN 160 MG PO TABS
160.0000 mg | ORAL_TABLET | Freq: Every day | ORAL | 3 refills | Status: DC
Start: 2023-01-11 — End: 2023-10-21

## 2023-01-17 ENCOUNTER — Other Ambulatory Visit: Payer: Self-pay

## 2023-01-17 ENCOUNTER — Ambulatory Visit (HOSPITAL_BASED_OUTPATIENT_CLINIC_OR_DEPARTMENT_OTHER): Payer: Medicare Other | Attending: Internal Medicine | Admitting: Physical Therapy

## 2023-01-17 ENCOUNTER — Encounter (HOSPITAL_BASED_OUTPATIENT_CLINIC_OR_DEPARTMENT_OTHER): Payer: Self-pay | Admitting: Physical Therapy

## 2023-01-17 DIAGNOSIS — M6281 Muscle weakness (generalized): Secondary | ICD-10-CM | POA: Diagnosis not present

## 2023-01-17 DIAGNOSIS — M25612 Stiffness of left shoulder, not elsewhere classified: Secondary | ICD-10-CM | POA: Diagnosis not present

## 2023-01-17 DIAGNOSIS — M25512 Pain in left shoulder: Secondary | ICD-10-CM | POA: Insufficient documentation

## 2023-01-17 NOTE — Therapy (Signed)
OUTPATIENT PHYSICAL THERAPY SHOULDER EVALUATION   Patient Name: Dalton Cardenas MRN: [id number] DOB:14-Mar-1934, 89 y.o., male Today's Date: 01/18/2023  END OF SESSION:  PT End of Session - 01/17/23 1251     Visit Number 1    Number of Visits 12    Date for PT Re-Evaluation 02/28/23    Authorization Type U[employer] MCR    PT Start Time 1155    PT Stop Time 1245    PT Time Calculation (min) 50 min    Activity Tolerance Patient tolerated treatment well    Behavior During Therapy [facility] for tasks assessed/performed             Past Medical History:  Diagnosis Date   Coronary artery disease    60-70% circumflex (cath in [place] 08/2010).   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 Holter monitoring 2011 - AV node reentry, multifocal atrial tachycardia, and resting bradycardia. b. previously tx with [facility] in [place].   Past Surgical History:  Procedure Laterality Date   ABLATION OF DYSRHYTHMIC FOCUS  10/13/2014   SVT         by Dr Laqueta Due FISSURE REPAIR     CARDIOVERSION N/A 07/01/2020   Procedure: CARDIOVERSION;  Surgeon: Yates Decamp, MD;  Location: [facility] ENDOSCOPY;  Service: Cardiovascular;  Laterality: N/A;   CARDIOVERSION N/A 09/04/2022   Procedure: CARDIOVERSION;  Surgeon: Yates Decamp, MD;  Location: [facility] ENDOSCOPY;  Service: Cardiovascular;  Laterality: N/A;   ELECTROPHYSIOLOGY STUDY N/A 10/13/2014   Procedure: ELECTROPHYSIOLOGY STUDY;  Surgeon: Marinus Maw, MD;  Location: [facility] CATH LAB;  Service: Cardiovascular;  Laterality: N/A;   EYE SURGERY Bilateral 2011   cataract    PROSTATECTOMY     SUPRAVENTRICULAR TACHYCARDIA ABLATION N/A 10/13/2014   Procedure:  SUPRAVENTRICULAR TACHYCARDIA ABLATION;  Surgeon: Marinus Maw, MD;  Location: [facility] CATH LAB;  Service: Cardiovascular;  Laterality: N/A;   TRANSCAROTID ARTERY REVASCULARIZATION (TCAR) Left 08/01/2020   TRANSCAROTID ARTERY REVASCULARIZATION  Left 08/01/2020   Procedure: LEFT TRANSCAROTID ARTERY REVASCULARIZATION;  Surgeon: Sherren Kerns, MD;  Location: [facility] OR;  Service: Vascular;  Laterality: Left;   ULTRASOUND GUIDANCE FOR VASCULAR ACCESS Right 08/01/2020   Procedure: ULTRASOUND GUIDANCE FOR VASCULAR ACCESS;  Surgeon: Sherren Kerns, MD;  Location: [facility] OR;  Service: Vascular;  Laterality: Right;   Patient Active Problem List   Diagnosis Date Noted   Presence of internal carotid stent (TCAR Left 2021) 02/07/2022   Primary osteoarthritis of left knee 05/01/2021   DVT (deep venous thrombosis) (HCC) 11/24/2020   Carotid stenosis 08/01/2020   Hyponatremia 06/30/2020   Atrial flutter (HCC) 06/30/2020   Chronic cough 11/26/2019   Neck pain 07/02/2018   SVT (supraventricular tachycardia) 10/13/2014   Syncope 07/22/2012   Coronary artery disease    CHEST PAIN UNSPECIFIED 04/13/2010   ADENOCARCINOMA, PROSTATE 12/12/2009   HYPERCHOLESTEROLEMIA 12/12/2009   Obstructive sleep apnea 12/12/2009   Essential hypertension, benign 12/12/2009   Supraventricular tachycardia 12/12/2009     REFERRING PROVIDER: Lorenda Ishihara, MD  REFERRING DIAG: M25.512 (ICD-10-CM) - Pain in left shoulder  THERAPY DIAG:  Left shoulder pain, unspecified  chronicity  Muscle weakness (generalized)  Stiffness of left shoulder, not elsewhere classified  Rationale for Evaluation and Treatment: Rehabilitation  ONSET DATE: Sx's began 1 to 1.5 years ago ; MD order 10/22/2022  SUBJECTIVE:                                                                                                                                                                                      SUBJECTIVE STATEMENT: Pt saw PT for frozen  shoulder approx 7-8 years ago and responded well.  Pt states he had a MRI then and was informed that he had tendon/ligament damage and wouldn't be a good candidate for surgery.  Pt's L shoulder was doing well.  He began having issues with L shoulder approx 1-1.5 years ago.  He then fell onto L side around March and thinks that could have made his L shoulder sx's worse.  Pt saw MD on 10/22/2022 and MD ordered PT.  Pt states he has a little pain in shoulder, but mainly ROM issues.  Pt has had frozen shoulder in the past and think it's frozen shoulder.  Pt reports stiffness and weakness in L shoulder.  Pt is limited with reaching overhead.  Pt has difficulty combing hair.  Pt uses R UE more with driving due to having difficulty with L.  Pt is limited with lifting.  It is difficult with him to use LE on rail with stairs.   Pt does get some stiffness in R shoulder though is temporary.  Pt reports the shoulder stiffness is getting worse.  Hand dominance: Right  PERTINENT HISTORY: CAD, prostate cancer, syncope, SVT, HTN, A-fib with hx of ablation and cardioversion, and Hx of DVT in 2022   PAIN:  Are you having pain?  Location:  L shoulder NPRS:  1/10 current, 3-4/10 worst, 0/10 best  PRECAUTIONS: Other: A-fib, DVT in 2022  WEIGHT BEARING RESTRICTIONS: No  FALLS:  Has patient fallen in last 6 months? No  LIVING ENVIRONMENT: Lives with: lives with their spouse Lives in: 2 story home, but primarily uses 1st floor Stairs: yes   OCCUPATION: retired  PLOF: Independent.  Pt was able to use L UE with ADLs and IADLs with much improved ease and less difficulty.   PATIENT GOALS: improve ROM and strength  NEXT MD VISIT:   OBJECTIVE:   DIAGNOSTIC FINDINGS:  No recent imaging  PATIENT SURVEYS:  FOTO Will give next visit  COGNITION: Overall cognitive status: Within functional limits for tasks assessed      UPPER EXTREMITY ROM:   Active ROM Right eval Left eval  Shoulder flexion 160 70  with significant compensatory shoulder hike  Shoulder  scaption 156 64 with significant compensatory shoulder hike  Shoulder abduction 150 45 with pain and significant compensatory shoulder hike  Shoulder adduction    Shoulder internal rotation 64 64  Shoulder external rotation 60 62  Elbow flexion    Elbow extension    Wrist flexion    Wrist extension    Wrist ulnar deviation    Wrist radial deviation    Wrist pronation    Wrist supination    (Blank rows = not tested)  L SHOULDER PROM:  Flex:126, Abd:  110 deg  UPPER EXTREMITY MMT:  MMT Right eval Left eval  Shoulder flexion 4-/5 Unable to tolerate resistance  Shoulder scaption 4/5   Shoulder abduction    Shoulder adduction    Shoulder internal rotation 4/5 Unable to tolerate resistance  Shoulder external rotation 3+/5 3+/5  Middle trapezius    Lower trapezius    Elbow flexion    Elbow extension    Wrist flexion    Wrist extension    Wrist ulnar deviation    Wrist radial deviation    Wrist pronation    Wrist supination    Grip strength (lbs)    (Blank rows = not tested)  SHOULDER SPECIAL TESTS: Rotator cuff assessment:  ER Lag test:  negative bilat                                          Subscapulars lift off test: Pt able to perform but has compensation bilat L > R.    PALPATION:  TTP:  anterior shoulder   TODAY'S TREATMENT:                                                                                                                                            Pt performed supine wand flexion x 10 and x 5 reps and supine clasped hands flexion AAROM x 10 reps.  Pt received a HEP handout and was educated in correct form and appropriate frequency.  He was instructed he should not have pain with exercises.   PATIENT EDUCATION: Education details: HEP, POC, dx, rationale of exercise, objective findings,  Person educated: Patient Education method: Explanation, Demonstration, Tactile cues, Verbal cues, and  Handouts Education comprehension: verbalized understanding, returned demonstration, verbal cues required, tactile cues required, and needs further education  HOME EXERCISE PROGRAM: Access Code: [email or web address] URL: [email or web address] Date: 01/17/2023 Prepared by: Aaron Edelman  Exercises - Supine Shoulder Flexion AAROM   - 2 x daily - 7 x weekly - 1-2 sets - 10 reps  ASSESSMENT:  CLINICAL IMPRESSION: Patient is a 89 y.o. male with a dx of L shoulder pain presenting to the clinic with L shoulder pain, muscle weakness in L shoulder, and limited ROM in L shoulder.  Pt is unable to  reach UE overhead and has significant compensatory shoulder hike when attempting.  He reports stiffness is greater than pain and his stiffness is getting worse.  Pt has difficulty with ADLs and IADLs including combing hair and driving.  Pt is limited with lifting.  He should benefit from skilled PT services to address impairments and to improve overall function.      OBJECTIVE IMPAIRMENTS: decreased ROM, decreased strength, hypomobility, impaired flexibility, impaired UE functional use, and pain.   ACTIVITY LIMITATIONS: lifting, reach over head, and hygiene/grooming  PARTICIPATION LIMITATIONS: driving  PERSONAL FACTORS: Time since onset of injury/illness/exacerbation and 1 comorbidity: A-fib  are also affecting patient's functional outcome.   REHAB POTENTIAL: Good  CLINICAL DECISION MAKING: Stable/uncomplicated  EVALUATION COMPLEXITY: Low   GOALS:   SHORT TERM GOALS: Target date:  02/07/2023   Pt will be independent and compliant with HEP for improved pain, ROM, strength, and function.  Baseline: Goal status: INITIAL  2.  Pt will be able to comb his hair.  Baseline:  Goal status: INITIAL  3.  Pt will demo L shoulder flexion and scaption AROM to at least 90 deg and at least a 20 deg improvement in abduction with reduced shoulder hike for improved UE reaching.   Baseline:  Goal status:  INITIAL   LONG TERM GOALS: Target date: 02/28/2023  Pt will be able to reach onto an overhead shelf.  Baseline:  Goal status: INITIAL  2.  Pt will report at least a 60% improvement in shoulder stiffness for improved performance of daily activities.  Baseline:  Goal status: INITIAL  3.  Pt will be able to use L UE with driving without difficulty.  Baseline:  Goal status: INITIAL  4.  Pt will demo at least 120 deg of L shoulder flexion AROM for improved performance of overhead activities.   Baseline:  Goal status: INITIAL  5.  Pt will be able to tolerate moderate resistance t/o L shoulder for improved functional lifting and performance of IADLs.   Baseline:  Goal status: INITIAL   PLAN:  PT FREQUENCY: 2x/week  PT DURATION: 6 weeks  PLANNED INTERVENTIONS: Therapeutic exercises, Therapeutic activity, Neuromuscular re-education, Patient/Family education, Self Care, Joint mobilization, Aquatic Therapy, Dry Needling, Cryotherapy, Moist heat, Taping, Ultrasound, Manual therapy, and Re-evaluation  PLAN FOR NEXT SESSION: scapular stabilization and strengthening, shoulder ROM, shoulder Iso's.  Give FOTO next visit.   Audie Clear III PT, DPT 01/18/23 6:58 AM

## 2023-01-21 DIAGNOSIS — I10 Essential (primary) hypertension: Secondary | ICD-10-CM | POA: Diagnosis not present

## 2023-01-22 ENCOUNTER — Ambulatory Visit: Payer: Self-pay

## 2023-01-22 NOTE — Patient Instructions (Signed)
Visit Information  Thank you for taking time to visit with me today. Please don't hesitate to contact me if I can be of assistance to you.   Following are the goals we discussed today:   Goals Addressed             This Visit's Progress    COMPLETED: Care Coordination Activities        Interventions Today    Flowsheet Row Most Recent Value  Chronic Disease   Chronic disease during today's visit Atrial Fibrillation (AFib)  [no further RNCM interventions needed goals met]  General Interventions   General Interventions Discussed/Reviewed General Interventions Reviewed, Doctor Visits  Doctor Visits Discussed/Reviewed Doctor Visits Reviewed, Annual Wellness Visits, PCP, Specialist  PCP/Specialist Visits Compliance with follow-up visit  Exercise Interventions   Exercise Discussed/Reviewed Exercise Reviewed  Education Interventions   Education Provided Provided Education  Provided Verbal Education On Exercise, When to see the doctor  Nutrition Interventions   Nutrition Discussed/Reviewed Nutrition Reviewed, Decreasing salt  Pharmacy Interventions   Pharmacy Dicussed/Reviewed Pharmacy Topics Reviewed      Goals met and case closed         If you are experiencing a Mental Health or Behavioral Health Crisis or need someone to talk to, please call the Suicide and Crisis Lifeline: 988 call the [place] National Suicide Prevention Lifeline: (726)784-8492 or TTY: 361-221-8482 TTY 432 192 1371) to talk to a trained counselor call 1-800-273-TALK (toll free, 24 hour hotline) go to [facility] [address], [place] [phone]) call 911   Patient verbalizes understanding of instructions and care plan provided today and agrees to view in MyChart. Active MyChart status and patient understanding of how to access instructions and care plan via MyChart confirmed with patient.     No further follow up required:    SIGNATURE Dudley Major RN,  Maximiano Coss, CDE Care Management Coordinator [facility] Care Management [phone]

## 2023-01-22 NOTE — Patient Outreach (Signed)
  Care Coordination   Follow Up Visit Note   01/22/2023 Name: Dalton Cardenas MRN: [id number] DOB: 09-15-1934  Dalton Cardenas is a 89 y.o. year old male who sees Dalton Ishihara, MD for primary care. I spoke with  Dalton Cardenas by phone today. Spoke with DPR wife Dalton Cardenas  What matters to the patients health and wellness today?  States he is doing good and his B/P has been good.    Goals Addressed             This Visit's Progress    COMPLETED: Care Coordination Activities        Interventions Today    Flowsheet Row Most Recent Value  Chronic Disease   Chronic disease during today's visit Atrial Fibrillation (AFib)  [no further RNCM interventions needed goals met]  General Interventions   General Interventions Discussed/Reviewed General Interventions Reviewed, Doctor Visits  Doctor Visits Discussed/Reviewed Doctor Visits Reviewed, Annual Wellness Visits, PCP, Specialist  PCP/Specialist Visits Compliance with follow-up visit  Exercise Interventions   Exercise Discussed/Reviewed Exercise Reviewed  Education Interventions   Education Provided Provided Education  Provided Verbal Education On Exercise, When to see the doctor  Nutrition Interventions   Nutrition Discussed/Reviewed Nutrition Reviewed, Decreasing salt  Pharmacy Interventions   Pharmacy Dicussed/Reviewed Pharmacy Topics Reviewed      Goals met and case closed        SDOH assessments and interventions completed:  No     Care Coordination Interventions:  Yes, provided   Follow up plan: No further intervention required. Goals met and case closed  Encounter Outcome:  Pt. Visit Completed  Dudley Major RN, BSN,CCM, CDE Care Management Coordinator [facility] Care Management [phone]

## 2023-01-23 ENCOUNTER — Encounter (HOSPITAL_BASED_OUTPATIENT_CLINIC_OR_DEPARTMENT_OTHER): Payer: Self-pay | Admitting: Physical Therapy

## 2023-01-23 ENCOUNTER — Ambulatory Visit (HOSPITAL_BASED_OUTPATIENT_CLINIC_OR_DEPARTMENT_OTHER): Payer: Medicare Other | Admitting: Physical Therapy

## 2023-01-23 DIAGNOSIS — M6281 Muscle weakness (generalized): Secondary | ICD-10-CM

## 2023-01-23 DIAGNOSIS — M25512 Pain in left shoulder: Secondary | ICD-10-CM

## 2023-01-23 DIAGNOSIS — M25612 Stiffness of left shoulder, not elsewhere classified: Secondary | ICD-10-CM | POA: Diagnosis not present

## 2023-01-23 NOTE — Therapy (Addendum)
OUTPATIENT PHYSICAL THERAPY SHOULDER EVALUATION   Patient Name: Dalton Cardenas MRN: [id number] DOB:1933/10/30, 89 y.o., male Today's Date: 01/23/2023  END OF SESSION:  PT End of Session - 01/23/23 1552     Visit Number 2    Number of Visits 12    Date for PT Re-Evaluation 02/28/23    Authorization Type [employer] MCR    PT Start Time 1530    PT Stop Time 1609    PT Time Calculation (min) 39 min    Activity Tolerance Patient tolerated treatment well    Behavior During Therapy [facility] for tasks assessed/performed              Past Medical History:  Diagnosis Date   Coronary artery disease    60-70% circumflex (cath in [place] 08/2010).   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 Holter monitoring 2011 - AV node reentry, multifocal atrial tachycardia, and resting bradycardia. b. previously tx with Abana in [place].   Past Surgical History:  Procedure Laterality Date   ABLATION OF DYSRHYTHMIC FOCUS  10/13/2014   SVT         by Dr Laqueta Due FISSURE REPAIR     CARDIOVERSION N/A 07/01/2020   Procedure: CARDIOVERSION;  Surgeon: Yates Decamp, MD;  Location: [facility] ENDOSCOPY;  Service: Cardiovascular;  Laterality: N/A;   CARDIOVERSION N/A 09/04/2022   Procedure: CARDIOVERSION;  Surgeon: Yates Decamp, MD;  Location: [facility] ENDOSCOPY;  Service: Cardiovascular;  Laterality: N/A;   ELECTROPHYSIOLOGY STUDY N/A 10/13/2014   Procedure: ELECTROPHYSIOLOGY STUDY;  Surgeon: Marinus Maw, MD;  Location: [facility] CATH LAB;  Service: Cardiovascular;  Laterality: N/A;   EYE SURGERY Bilateral 2011   cataract    PROSTATECTOMY     SUPRAVENTRICULAR TACHYCARDIA ABLATION N/A 10/13/2014   Procedure:  SUPRAVENTRICULAR TACHYCARDIA ABLATION;  Surgeon: Marinus Maw, MD;  Location: [facility] CATH LAB;  Service: Cardiovascular;  Laterality: N/A;   TRANSCAROTID ARTERY REVASCULARIZATION (TCAR) Left 08/01/2020   TRANSCAROTID ARTERY REVASCULARIZATION  Left 08/01/2020   Procedure: LEFT TRANSCAROTID ARTERY REVASCULARIZATION;  Surgeon: Sherren Kerns, MD;  Location: [facility] OR;  Service: Vascular;  Laterality: Left;   ULTRASOUND GUIDANCE FOR VASCULAR ACCESS Right 08/01/2020   Procedure: ULTRASOUND GUIDANCE FOR VASCULAR ACCESS;  Surgeon: Sherren Kerns, MD;  Location: [facility] OR;  Service: Vascular;  Laterality: Right;   Patient Active Problem List   Diagnosis Date Noted   Presence of internal carotid stent (TCAR Left 2021) 02/07/2022   Primary osteoarthritis of left knee 05/01/2021   DVT (deep venous thrombosis) (HCC) 11/24/2020   Carotid stenosis 08/01/2020   Hyponatremia 06/30/2020   Atrial flutter (HCC) 06/30/2020   Chronic cough 11/26/2019   Neck pain 07/02/2018   SVT (supraventricular tachycardia) 10/13/2014   Syncope 07/22/2012   Coronary artery disease    CHEST PAIN UNSPECIFIED 04/13/2010   ADENOCARCINOMA, PROSTATE 12/12/2009   HYPERCHOLESTEROLEMIA 12/12/2009   Obstructive sleep apnea 12/12/2009   Essential hypertension, benign 12/12/2009   Supraventricular tachycardia 12/12/2009     REFERRING PROVIDER: Lorenda Ishihara, MD  REFERRING DIAG: M25.512 (ICD-10-CM) - Pain in left shoulder  THERAPY DIAG:  Left shoulder pain,  unspecified chronicity  Muscle weakness (generalized)  Stiffness of left shoulder, not elsewhere classified  Rationale for Evaluation and Treatment: Rehabilitation  ONSET DATE: Sx's began 1 to 1.5 years ago ; MD order 10/22/2022  SUBJECTIVE:                                                                                                                                                                                      SUBJECTIVE STATEMENT:  Pt states the  shoulder is still painful to lift. It will linger for a few mins and then will go away.    Eval:  Pt saw PT for frozen shoulder approx 7-8 years ago and responded well.  Pt states he had a MRI then and was informed that he had tendon/ligament damage and wouldn't be a good candidate for surgery.  Pt's L shoulder was doing well.  He began having issues with L shoulder approx 1-1.5 years ago.  He then fell onto L side around March and thinks that could have made his L shoulder sx's worse.  Pt saw MD on 10/22/2022 and MD ordered PT.  Pt states he has a little pain in shoulder, but mainly ROM issues.  Pt has had frozen shoulder in the past and think it's frozen shoulder.  Pt reports stiffness and weakness in L shoulder.  Pt is limited with reaching overhead.  Pt has difficulty combing hair.  Pt uses R UE more with driving due to having difficulty with L.  Pt is limited with lifting.  It is difficult with him to use LE on rail with stairs.   Pt does get some stiffness in R shoulder though is temporary.  Pt reports the shoulder stiffness is getting worse.  Hand dominance: Right  PERTINENT HISTORY: CAD, prostate cancer, syncope, SVT, HTN, A-fib with hx of ablation and cardioversion, and Hx of DVT in 2022   PAIN:  Are you having pain?  Location:  L shoulder NPRS:  1/10 current, 3-4/10 worst, 0/10 best  PRECAUTIONS: Other: A-fib, DVT in 2022  WEIGHT BEARING RESTRICTIONS: No  FALLS:  Has patient fallen in last 6 months? No  LIVING ENVIRONMENT: Lives with: lives with their spouse Lives in: 2 story home, but primarily uses 1st floor Stairs: yes   OCCUPATION: retired  PLOF: Independent.  Pt was able to use L UE with ADLs and IADLs with much improved ease and less difficulty.   PATIENT GOALS: improve ROM and strength  NEXT MD VISIT:   OBJECTIVE:   DIAGNOSTIC FINDINGS:  No recent imaging  PATIENT SURVEYS:  FOTO 42     TODAY'S TREATMENT:  5/8  Grade III inf L GHJ mob in flexion and ABD  Table flexion 15s 6x Table ABD 15s 6x Cane ER 5s 10x  Pulleys 2 min flexion and scaption  S/L ER 3x10 1lb    PATIENT EDUCATION: Education details: HEP, POC, dx, rationale of exercise, objective findings,  Person educated: Patient Education method: Explanation, Demonstration, Tactile cues, Verbal cues, and Handouts Education comprehension: verbalized understanding, returned demonstration, verbal cues required, tactile cues required, and needs further education  HOME EXERCISE PROGRAM: Access Code: [email or web address] URL: [email or web address] Date: 01/17/2023 Prepared by: Aaron Edelman  ASSESSMENT:  CLINICAL IMPRESSION: Pt with continued stiffness into flexion and ABD as expected with joint mobilizations with report of only tightness vs pain. Pt was able to progress AAROM with and without gravity in order to progress L GHJ ROM. HEP updated today for gravity reduced and ant-gravity motion with AAROM. Pt notes L UT and neck stiffness due to his compensatory motions with lifting. Plan to continue with L GHJ mobility and AROM as tolerated. He should benefit from skilled PT services to address impairments and to improve overall function.      OBJECTIVE IMPAIRMENTS: decreased ROM, decreased strength, hypomobility, impaired flexibility, impaired UE functional use, and pain.   ACTIVITY LIMITATIONS: lifting, reach over head, and hygiene/grooming  PARTICIPATION LIMITATIONS: driving  PERSONAL FACTORS: Time since onset of injury/illness/exacerbation and 1 comorbidity: A-fib  are also affecting patient's functional outcome.   REHAB POTENTIAL: Good  CLINICAL DECISION MAKING: Stable/uncomplicated  EVALUATION COMPLEXITY: Low   GOALS:   SHORT TERM GOALS: Target date:  02/07/2023   Pt will be independent and compliant with HEP for improved pain,  ROM, strength, and function.  Baseline: Goal status: INITIAL  2.  Pt will be able to comb his hair.  Baseline:  Goal status: INITIAL  3.  Pt will demo L shoulder flexion and scaption AROM to at least 90 deg and at least a 20 deg improvement in abduction with reduced shoulder hike for improved UE reaching.   Baseline:  Goal status: INITIAL   LONG TERM GOALS: Target date: 02/28/2023  Pt will be able to reach onto an overhead shelf.  Baseline:  Goal status: INITIAL  2.  Pt will report at least a 60% improvement in shoulder stiffness for improved performance of daily activities.  Baseline:  Goal status: INITIAL  3.  Pt will be able to use L UE with driving without difficulty.  Baseline:  Goal status: INITIAL  4.  Pt will demo at least 120 deg of L shoulder flexion AROM for improved performance of overhead activities.   Baseline:  Goal status: INITIAL  5.  Pt will be able to tolerate moderate resistance t/o L shoulder for improved functional lifting and performance of IADLs.   Baseline:  Goal status: INITIAL   PLAN:  PT FREQUENCY: 2x/week  PT DURATION: 6 weeks  PLANNED INTERVENTIONS: Therapeutic exercises, Therapeutic activity, Neuromuscular re-education, Patient/Family education, Self Care, Joint mobilization, Aquatic Therapy, Dry Needling, Cryotherapy, Moist heat, Taping, Ultrasound, Manual therapy, and Re-evaluation  PLAN FOR NEXT SESSION: scapular stabilization and strengthening, shoulder ROM, shoulder Iso's.  Give FOTO next visit.  Zebedee Iba PT, DPT 02/04/23 3:53 PM

## 2023-02-04 ENCOUNTER — Ambulatory Visit (HOSPITAL_BASED_OUTPATIENT_CLINIC_OR_DEPARTMENT_OTHER): Payer: Medicare Other | Admitting: Physical Therapy

## 2023-02-04 ENCOUNTER — Encounter (HOSPITAL_BASED_OUTPATIENT_CLINIC_OR_DEPARTMENT_OTHER): Payer: Self-pay | Admitting: Physical Therapy

## 2023-02-04 DIAGNOSIS — M25612 Stiffness of left shoulder, not elsewhere classified: Secondary | ICD-10-CM | POA: Diagnosis not present

## 2023-02-04 DIAGNOSIS — M6281 Muscle weakness (generalized): Secondary | ICD-10-CM | POA: Diagnosis not present

## 2023-02-04 DIAGNOSIS — M25512 Pain in left shoulder: Secondary | ICD-10-CM

## 2023-02-04 NOTE — Therapy (Signed)
OUTPATIENT PHYSICAL THERAPY SHOULDER EVALUATION   Patient Name: Dalton Cardenas MRN: [id number] DOB:November 30, 1933, 89 y.o., male Today's Date: 02/04/2023  END OF SESSION:  PT End of Session - 02/04/23 1551     Visit Number 3    Number of Visits 12    Date for PT Re-Evaluation 02/28/23    Authorization Type UHC MCR    PT Start Time 1530    PT Stop Time 1610    PT Time Calculation (min) 40 min    Activity Tolerance Patient tolerated treatment well    Behavior During Therapy [facility] for tasks assessed/performed              Past Medical History:  Diagnosis Date   Coronary artery disease    60-70% circumflex (cath in [place] 08/2010).   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 Holter monitoring 2011 - AV node reentry, multifocal atrial tachycardia, and resting bradycardia. b. previously tx with Abana in [place].   Past Surgical History:  Procedure Laterality Date   ABLATION OF DYSRHYTHMIC FOCUS  10/13/2014   SVT         by Dr Laqueta Due FISSURE REPAIR     CARDIOVERSION N/A 07/01/2020   Procedure: CARDIOVERSION;  Surgeon: Yates Decamp, MD;  Location: [facility] ENDOSCOPY;  Service: Cardiovascular;  Laterality: N/A;   CARDIOVERSION N/A 09/04/2022   Procedure: CARDIOVERSION;  Surgeon: Yates Decamp, MD;  Location: [facility] ENDOSCOPY;  Service: Cardiovascular;  Laterality: N/A;   ELECTROPHYSIOLOGY STUDY N/A 10/13/2014   Procedure: ELECTROPHYSIOLOGY STUDY;  Surgeon: Marinus Maw, MD;  Location: [facility] CATH LAB;  Service: Cardiovascular;  Laterality: N/A;   EYE SURGERY Bilateral 2011   cataract    PROSTATECTOMY     SUPRAVENTRICULAR TACHYCARDIA ABLATION N/A 10/13/2014   Procedure:  SUPRAVENTRICULAR TACHYCARDIA ABLATION;  Surgeon: Marinus Maw, MD;  Location: [facility] CATH LAB;  Service: Cardiovascular;  Laterality: N/A;   TRANSCAROTID ARTERY REVASCULARIZATION (TCAR) Left 08/01/2020   TRANSCAROTID ARTERY REVASCULARIZATION  Left 08/01/2020   Procedure: LEFT TRANSCAROTID ARTERY REVASCULARIZATION;  Surgeon: Sherren Kerns, MD;  Location: [facility] OR;  Service: Vascular;  Laterality: Left;   ULTRASOUND GUIDANCE FOR VASCULAR ACCESS Right 08/01/2020   Procedure: ULTRASOUND GUIDANCE FOR VASCULAR ACCESS;  Surgeon: Sherren Kerns, MD;  Location: [facility] OR;  Service: Vascular;  Laterality: Right;   Patient Active Problem List   Diagnosis Date Noted   Presence of internal carotid stent (TCAR Left 2021) 02/07/2022   Primary osteoarthritis of left knee 05/01/2021   DVT (deep venous thrombosis) (HCC) 11/24/2020   Carotid stenosis 08/01/2020   Hyponatremia 06/30/2020   Atrial flutter (HCC) 06/30/2020   Chronic cough 11/26/2019   Neck pain 07/02/2018   SVT (supraventricular tachycardia) 10/13/2014   Syncope 07/22/2012   Coronary artery disease    CHEST PAIN UNSPECIFIED 04/13/2010   ADENOCARCINOMA, PROSTATE 12/12/2009   HYPERCHOLESTEROLEMIA 12/12/2009   Obstructive sleep apnea 12/12/2009   Essential hypertension, benign 12/12/2009   Supraventricular tachycardia 12/12/2009     REFERRING PROVIDER: Lorenda Ishihara, MD  REFERRING DIAG: M25.512 (ICD-10-CM) - Pain in left shoulder  THERAPY DIAG:  Left shoulder pain,  unspecified chronicity  Muscle weakness (generalized)  Stiffness of left shoulder, not elsewhere classified  Rationale for Evaluation and Treatment: Rehabilitation  ONSET DATE: Sx's began 1 to 1.5 years ago ; MD order 10/22/2022  SUBJECTIVE:                                                                                                                                                                                      SUBJECTIVE STATEMENT:  Pt states the  shoulder is reaching better. Was sore after last session but no increase in pain.    Eval:  Pt saw PT for frozen shoulder approx 7-8 years ago and responded well.  Pt states he had a MRI then and was informed that he had tendon/ligament damage and wouldn't be a good candidate for surgery.  Pt's L shoulder was doing well.  He began having issues with L shoulder approx 1-1.5 years ago.  He then fell onto L side around March and thinks that could have made his L shoulder sx's worse.  Pt saw MD on 10/22/2022 and MD ordered PT.  Pt states he has a little pain in shoulder, but mainly ROM issues.  Pt has had frozen shoulder in the past and think it's frozen shoulder.  Pt reports stiffness and weakness in L shoulder.  Pt is limited with reaching overhead.  Pt has difficulty combing hair.  Pt uses R UE more with driving due to having difficulty with L.  Pt is limited with lifting.  It is difficult with him to use LE on rail with stairs.   Pt does get some stiffness in R shoulder though is temporary.  Pt reports the shoulder stiffness is getting worse.  Hand dominance: Right  PERTINENT HISTORY: CAD, prostate cancer, syncope, SVT, HTN, A-fib with hx of ablation and cardioversion, and Hx of DVT in 2022   PAIN:  Are you having pain?  Location:  L shoulder NPRS:  1/10 current, 3-4/10 worst, 0/10 best  PRECAUTIONS: Other: A-fib, DVT in 2022  WEIGHT BEARING RESTRICTIONS: No  FALLS:  Has patient fallen in last 6 months? No  LIVING ENVIRONMENT: Lives with: lives with their spouse Lives in: 2 story home, but primarily uses 1st floor Stairs: yes   OCCUPATION: retired  PLOF: Independent.  Pt was able to use L UE with ADLs and IADLs with much improved ease and less difficulty.   PATIENT GOALS: improve ROM and strength  NEXT MD VISIT:   OBJECTIVE:   DIAGNOSTIC FINDINGS:  No recent imaging  PATIENT SURVEYS:  FOTO 42     TODAY'S TREATMENT:  5/20  Grade III inf L GHJ mob in flexion and ABD  Finger ladder ABD 5s 2x10 Table ABD 15s 6x Gravity reduced, seated flexion 3x8 Standing RTB shoulder ext 3x10 Pulleys 2 min flexion and scaption  Shoulder flexion eccentric on wall 2x5   PATIENT EDUCATION: Education details: HEP, POC, dx, rationale of exercise, objective findings,  Person educated: Patient Education method: Explanation, Demonstration, Tactile cues, Verbal cues, and Handouts Education comprehension: verbalized understanding, returned demonstration, verbal cues required, tactile cues required, and needs further education  HOME EXERCISE PROGRAM: Access Code: [email or web address] URL: [email or web address] Date: 01/17/2023 Prepared by: Aaron Edelman  ASSESSMENT:  CLINICAL IMPRESSION: Pt was able to gain flexion ROM today to 140 with AAROM and 110 ABD AAROM. No pain noted during session other than fatigue in gravity reduce position. Eccentric added in for home. Pt's stiffness appears to be improving but muscle activation into flexion continues to be difficult. Plan to continue with ROM and muscle activation as tolerated. Consider NMES to the shoulder at future visits though previous cardiac history may be of concern. He should benefit from skilled PT services to address impairments and to improve overall function.      OBJECTIVE IMPAIRMENTS: decreased ROM, decreased strength, hypomobility, impaired flexibility, impaired UE functional use, and pain.   ACTIVITY LIMITATIONS: lifting, reach over head, and hygiene/grooming  PARTICIPATION LIMITATIONS: driving  PERSONAL FACTORS: Time since onset of injury/illness/exacerbation and 1 comorbidity: A-fib  are also affecting patient's functional outcome.   REHAB POTENTIAL: Good  CLINICAL DECISION MAKING: Stable/uncomplicated  EVALUATION COMPLEXITY: Low   GOALS:   SHORT TERM GOALS:  Target date:  02/07/2023   Pt will be independent and compliant with HEP for improved pain, ROM, strength, and function.  Baseline: Goal status: INITIAL  2.  Pt will be able to comb his hair.  Baseline:  Goal status: INITIAL  3.  Pt will demo L shoulder flexion and scaption AROM to at least 90 deg and at least a 20 deg improvement in abduction with reduced shoulder hike for improved UE reaching.   Baseline:  Goal status: INITIAL   LONG TERM GOALS: Target date: 02/28/2023  Pt will be able to reach onto an overhead shelf.  Baseline:  Goal status: INITIAL  2.  Pt will report at least a 60% improvement in shoulder stiffness for improved performance of daily activities.  Baseline:  Goal status: INITIAL  3.  Pt will be able to use L UE with driving without difficulty.  Baseline:  Goal status: INITIAL  4.  Pt will demo at least 120 deg of L shoulder flexion AROM for improved performance of overhead activities.   Baseline:  Goal status: INITIAL  5.  Pt will be able to tolerate moderate resistance t/o L shoulder for improved functional lifting and performance of IADLs.   Baseline:  Goal status: INITIAL   PLAN:  PT FREQUENCY: 2x/week  PT DURATION: 6 weeks  PLANNED INTERVENTIONS: Therapeutic exercises, Therapeutic activity, Neuromuscular re-education, Patient/Family education, Self Care, Joint mobilization, Aquatic Therapy, Dry Needling, Cryotherapy, Moist heat, Taping, Ultrasound, Manual therapy, and Re-evaluation  PLAN FOR NEXT SESSION: scapular stabilization and strengthening, shoulder ROM, shoulder Iso's.  Give FOTO next visit.  Zebedee Iba PT, DPT 02/04/23 3:51 PM

## 2023-02-06 ENCOUNTER — Encounter (HOSPITAL_BASED_OUTPATIENT_CLINIC_OR_DEPARTMENT_OTHER): Payer: Medicare Other | Admitting: Physical Therapy

## 2023-02-14 ENCOUNTER — Ambulatory Visit (HOSPITAL_BASED_OUTPATIENT_CLINIC_OR_DEPARTMENT_OTHER): Payer: Medicare Other | Admitting: Physical Therapy

## 2023-02-14 ENCOUNTER — Encounter (HOSPITAL_BASED_OUTPATIENT_CLINIC_OR_DEPARTMENT_OTHER): Payer: Self-pay | Admitting: Physical Therapy

## 2023-02-14 DIAGNOSIS — M25612 Stiffness of left shoulder, not elsewhere classified: Secondary | ICD-10-CM

## 2023-02-14 DIAGNOSIS — M6281 Muscle weakness (generalized): Secondary | ICD-10-CM | POA: Diagnosis not present

## 2023-02-14 DIAGNOSIS — M25512 Pain in left shoulder: Secondary | ICD-10-CM | POA: Diagnosis not present

## 2023-02-14 NOTE — Therapy (Addendum)
OUTPATIENT PHYSICAL THERAPY SHOULDER EVALUATION   Patient Name: Dalton Cardenas MRN: [id number] DOB:1934/09/07, 89 y.o., male Today's Date: 02/14/2023  END OF SESSION:  PT End of Session - 02/14/23 1444     Visit Number 4    Number of Visits 12    Date for PT Re-Evaluation 02/28/23    Authorization Type [employer] MCR    PT Start Time 1405    PT Stop Time 1447    PT Time Calculation (min) 42 min    Activity Tolerance Patient tolerated treatment well    Behavior During Therapy [facility] for tasks assessed/performed               Past Medical History:  Diagnosis Date   Coronary artery disease    60-70% circumflex (cath in [place] 08/2010).   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 Holter monitoring 2011 - AV node reentry, multifocal atrial tachycardia, and resting bradycardia. b. previously tx with Abana in [place].   Past Surgical History:  Procedure Laterality Date   ABLATION OF DYSRHYTHMIC FOCUS  10/13/2014   SVT         by Dr Laqueta Due FISSURE REPAIR     CARDIOVERSION N/A 07/01/2020   Procedure: CARDIOVERSION;  Surgeon: Yates Decamp, MD;  Location: [facility] ENDOSCOPY;  Service: Cardiovascular;  Laterality: N/A;   CARDIOVERSION N/A 09/04/2022   Procedure: CARDIOVERSION;  Surgeon: Yates Decamp, MD;  Location: [facility] ENDOSCOPY;  Service: Cardiovascular;  Laterality: N/A;   ELECTROPHYSIOLOGY STUDY N/A 10/13/2014   Procedure: ELECTROPHYSIOLOGY STUDY;  Surgeon: Marinus Maw, MD;  Location: [facility] CATH LAB;  Service: Cardiovascular;  Laterality: N/A;   EYE SURGERY Bilateral 2011   cataract    PROSTATECTOMY     SUPRAVENTRICULAR TACHYCARDIA ABLATION N/A 10/13/2014   Procedure:  SUPRAVENTRICULAR TACHYCARDIA ABLATION;  Surgeon: Marinus Maw, MD;  Location: [facility] CATH LAB;  Service: Cardiovascular;  Laterality: N/A;   TRANSCAROTID ARTERY REVASCULARIZATION (TCAR) Left 08/01/2020   TRANSCAROTID ARTERY REVASCULARIZATION  Left 08/01/2020   Procedure: LEFT TRANSCAROTID ARTERY REVASCULARIZATION;  Surgeon: Sherren Kerns, MD;  Location: [facility] OR;  Service: Vascular;  Laterality: Left;   ULTRASOUND GUIDANCE FOR VASCULAR ACCESS Right 08/01/2020   Procedure: ULTRASOUND GUIDANCE FOR VASCULAR ACCESS;  Surgeon: Sherren Kerns, MD;  Location: [facility] OR;  Service: Vascular;  Laterality: Right;   Patient Active Problem List   Diagnosis Date Noted   Presence of internal carotid stent (TCAR Left 2021) 02/07/2022   Primary osteoarthritis of left knee 05/01/2021   DVT (deep venous thrombosis) (HCC) 11/24/2020   Carotid stenosis 08/01/2020   Hyponatremia 06/30/2020   Atrial flutter (HCC) 06/30/2020   Chronic cough 11/26/2019   Neck pain 07/02/2018   SVT (supraventricular tachycardia) 10/13/2014   Syncope 07/22/2012   Coronary artery disease    CHEST PAIN UNSPECIFIED 04/13/2010   ADENOCARCINOMA, PROSTATE 12/12/2009   HYPERCHOLESTEROLEMIA 12/12/2009   Obstructive sleep apnea 12/12/2009   Essential hypertension, benign 12/12/2009   Supraventricular tachycardia 12/12/2009     REFERRING PROVIDER: Lorenda Ishihara, MD  REFERRING DIAG: M25.512 (ICD-10-CM) - Pain in left shoulder  THERAPY DIAG:  Left shoulder  pain, unspecified chronicity  Muscle weakness (generalized)  Stiffness of left shoulder, not elsewhere classified  Rationale for Evaluation and Treatment: Rehabilitation  ONSET DATE: Sx's began 1 to 1.5 years ago ; MD order 10/22/2022  SUBJECTIVE:                                                                                                                                                                                      SUBJECTIVE STATEMENT:  Pt states the  shoulder was sore/painful after for 2-3 days. States the shoulder flexion exercise is the most difficult. Pt does feel improvement and "loosening up" after exercise.   Eval:  Pt saw PT for frozen shoulder approx 7-8 years ago and responded well.  Pt states he had a MRI then and was informed that he had tendon/ligament damage and wouldn't be a good candidate for surgery.  Pt's L shoulder was doing well.  He began having issues with L shoulder approx 1-1.5 years ago.  He then fell onto L side around March and thinks that could have made his L shoulder sx's worse.  Pt saw MD on 10/22/2022 and MD ordered PT.  Pt states he has a little pain in shoulder, but mainly ROM issues.  Pt has had frozen shoulder in the past and think it's frozen shoulder.  Pt reports stiffness and weakness in L shoulder.  Pt is limited with reaching overhead.  Pt has difficulty combing hair.  Pt uses R UE more with driving due to having difficulty with L.  Pt is limited with lifting.  It is difficult with him to use LE on rail with stairs.   Pt does get some stiffness in R shoulder though is temporary.  Pt reports the shoulder stiffness is getting worse.  Hand dominance: Right  PERTINENT HISTORY: CAD, prostate cancer, syncope, SVT, HTN, A-fib with hx of ablation and cardioversion, and Hx of DVT in 2022   PAIN:  Are you having pain?  Location:  L shoulder NPRS:  1/10 current, 3-4/10 worst, 0/10 best  PRECAUTIONS: Other: A-fib, DVT in 2022  WEIGHT BEARING RESTRICTIONS: No  FALLS:  Has patient fallen in last 6 months? No  LIVING ENVIRONMENT: Lives with: lives with their spouse Lives in: 2 story home, but primarily uses 1st floor Stairs: yes   OCCUPATION: retired  PLOF: Independent.  Pt was able to use L UE with ADLs and IADLs with much improved ease and less difficulty.   PATIENT GOALS: improve ROM and strength  NEXT MD VISIT:   OBJECTIVE:   DIAGNOSTIC FINDINGS:  No recent imaging  PATIENT SURVEYS:  FOTO  42     TODAY'S TREATMENT:  5/30  Grade IV inf L GHJ mob in flexion and ABD  Supine alphabet 2x 1lb Supine protraction 1lb 2x10 Supine AAROM flexion (as little R UE support as needed) 3x10 Wall push up 3x8 Short lever ABD 2x10 seated  Cane ER 5s 2x10 Pulleys 2 min flexion and scaption    PATIENT EDUCATION: Education details: HEP, POC Person educated: Patient Education method: Programmer, multimedia, Demonstration, Tactile cues, Verbal cues, and Handouts Education comprehension: verbalized understanding, returned demonstration, verbal cues required, tactile cues required, and needs further education  HOME EXERCISE PROGRAM: Access Code: [email or web address] URL: [email or web address] Date: 01/17/2023 Prepared by: Aaron Edelman  ASSESSMENT:  CLINICAL IMPRESSION: Pt continues with AAROM and end range stretching today with good tolerance and <3/10 pain during session. Pt with most tightness into ER position. UT compensation once pt reach ~75 flexion Pt was able to start CKC motions today without discomfort. NMES deferred due to cardiac history. Plan to continue with AROM as tolerated. He should benefit from skilled PT services to address impairments and to improve overall function.      OBJECTIVE IMPAIRMENTS: decreased ROM, decreased strength, hypomobility, impaired flexibility, impaired UE functional use, and pain.   ACTIVITY LIMITATIONS: lifting, reach over head, and hygiene/grooming  PARTICIPATION LIMITATIONS: driving  PERSONAL FACTORS: Time since onset of injury/illness/exacerbation and 1 comorbidity: A-fib  are also affecting patient's functional outcome.   REHAB POTENTIAL: Good  CLINICAL DECISION MAKING: Stable/uncomplicated  EVALUATION COMPLEXITY: Low   GOALS:   SHORT TERM GOALS: Target date:  02/07/2023   Pt will be independent and  compliant with HEP for improved pain, ROM, strength, and function.  Baseline: Goal status: MET  2.  Pt will be able to comb his hair.  Baseline:  Goal status: INITIAL  3.  Pt will demo L shoulder flexion and scaption AROM to at least 90 deg and at least a 20 deg improvement in abduction with reduced shoulder hike for improved UE reaching.   Baseline:  Goal status: INITIAL   LONG TERM GOALS: Target date: 02/28/2023  Pt will be able to reach onto an overhead shelf.  Baseline:  Goal status: INITIAL  2.  Pt will report at least a 60% improvement in shoulder stiffness for improved performance of daily activities.  Baseline:  Goal status: INITIAL  3.  Pt will be able to use L UE with driving without difficulty.  Baseline:  Goal status: INITIAL  4.  Pt will demo at least 120 deg of L shoulder flexion AROM for improved performance of overhead activities.   Baseline:  Goal status: INITIAL  5.  Pt will be able to tolerate moderate resistance t/o L shoulder for improved functional lifting and performance of IADLs.   Baseline:  Goal status: INITIAL   PLAN:  PT FREQUENCY: 2x/week  PT DURATION: 6 weeks  PLANNED INTERVENTIONS: Therapeutic exercises, Therapeutic activity, Neuromuscular re-education, Patient/Family education, Self Care, Joint mobilization, Aquatic Therapy, Dry Needling, Cryotherapy, Moist heat, Taping, Ultrasound, Manual therapy, and Re-evaluation  PLAN FOR NEXT SESSION: scapular stabilization and strengthening, shoulder ROM  Zebedee Iba PT, DPT 02/14/23 2:50 PM  PHYSICAL THERAPY DISCHARGE SUMMARY  Visits from Start of Care: 4  Current functional level related to goals / functional outcomes: Unable to assess due to pt not being present at discharge.   Remaining deficits: Unable to assess due to pt not being present at discharge.     Education / Equipment:    Patient was seen in PT from 5/2 to 02/14/23.  He had issues with mobility  due to his knee and  cancelled his remaining appointments.  Pt also cancelled some of his appt's due to being out of town.  Pt will be discharged from skilled PT due to cancelling his appointments and not rescheduling.    Audie Clear III PT, DPT 11/07/23 9:21 AM

## 2023-02-16 ENCOUNTER — Encounter (HOSPITAL_BASED_OUTPATIENT_CLINIC_OR_DEPARTMENT_OTHER): Payer: Self-pay | Admitting: Emergency Medicine

## 2023-02-16 ENCOUNTER — Emergency Department (HOSPITAL_BASED_OUTPATIENT_CLINIC_OR_DEPARTMENT_OTHER): Payer: Medicare Other

## 2023-02-16 ENCOUNTER — Emergency Department (HOSPITAL_BASED_OUTPATIENT_CLINIC_OR_DEPARTMENT_OTHER)
Admission: EM | Admit: 2023-02-16 | Discharge: 2023-02-17 | Disposition: A | Discharge status: Home / Self Care | Payer: Medicare Other | Attending: Emergency Medicine | Admitting: Emergency Medicine

## 2023-02-16 DIAGNOSIS — M25061 Hemarthrosis, right knee: Secondary | ICD-10-CM | POA: Insufficient documentation

## 2023-02-16 DIAGNOSIS — M1711 Unilateral primary osteoarthritis, right knee: Secondary | ICD-10-CM | POA: Diagnosis not present

## 2023-02-16 DIAGNOSIS — M25461 Effusion, right knee: Secondary | ICD-10-CM | POA: Diagnosis not present

## 2023-02-16 DIAGNOSIS — M2500 Hemarthrosis, unspecified joint: Secondary | ICD-10-CM

## 2023-02-16 DIAGNOSIS — Z7901 Long term (current) use of anticoagulants: Secondary | ICD-10-CM | POA: Diagnosis not present

## 2023-02-16 DIAGNOSIS — M25561 Pain in right knee: Secondary | ICD-10-CM | POA: Diagnosis present

## 2023-02-16 MED ORDER — LIDOCAINE-EPINEPHRINE 1 %-1:100000 IJ SOLN
10.0000 mL | Freq: Once | INTRAMUSCULAR | Status: AC
  Administered 2023-02-17: 10 mL

## 2023-02-16 MED ORDER — LIDOCAINE-EPINEPHRINE (PF) 2 %-1:200000 IJ SOLN
INTRAMUSCULAR | Status: AC
  Filled 2023-02-16: qty 20

## 2023-02-16 MED ORDER — ACETAMINOPHEN 325 MG PO TABS
650.0000 mg | ORAL_TABLET | Freq: Once | ORAL | Status: AC
  Administered 2023-02-16: 650 mg via ORAL
  Filled 2023-02-16: qty 2

## 2023-02-16 NOTE — ED Triage Notes (Signed)
Notice additional swelling in knee leg Some weakness  Painful to put pressure on knee  Had similar in 2022 was DVT  Symptoms noticed around 11 AM

## 2023-02-17 ENCOUNTER — Other Ambulatory Visit (HOSPITAL_BASED_OUTPATIENT_CLINIC_OR_DEPARTMENT_OTHER): Payer: Self-pay | Admitting: Emergency Medicine

## 2023-02-17 ENCOUNTER — Ambulatory Visit (HOSPITAL_BASED_OUTPATIENT_CLINIC_OR_DEPARTMENT_OTHER)
Admission: RE | Admit: 2023-02-17 | Discharge: 2023-02-17 | Disposition: A | Discharge status: Home / Self Care | Payer: Medicare Other | Source: Ambulatory Visit | Attending: Emergency Medicine | Admitting: Emergency Medicine

## 2023-02-17 DIAGNOSIS — M1712 Unilateral primary osteoarthritis, left knee: Secondary | ICD-10-CM | POA: Diagnosis not present

## 2023-02-17 DIAGNOSIS — M7989 Other specified soft tissue disorders: Secondary | ICD-10-CM | POA: Diagnosis not present

## 2023-02-17 DIAGNOSIS — M25561 Pain in right knee: Secondary | ICD-10-CM

## 2023-02-17 DIAGNOSIS — M2500 Hemarthrosis, unspecified joint: Secondary | ICD-10-CM

## 2023-02-17 NOTE — ED Provider Notes (Signed)
[facility] AT [facility] Cardenas Provider Note   CSN: [id number] Arrival date & time: 02/16/23  1934     History  Chief Complaint  Patient presents with   Knee Pain    Dalton Cardenas is a 89 y.o. male.  HPI     89 year old male comes in with chief complaint of knee swelling. He has history of SVT/a flutter, left lower extremity DVT and is on Eliquis.  Patient also has history of hemarthrosis and arthritis of the left knee, and about a year or 2 ago required arthrocentesis to aspirate joint fluid.  He is accompanied by his wife and daughter.  According to the patient's wife, patient was feeling well this morning and also completed his knee exercises without any issues.  Per patient, sometime later in the day he started having sudden swelling to his left knee followed by pain.  All of a sudden the pain and swelling worsened to the point where it is hard for him to bear weight or move.  In the past, left lower extremity swelling led to arthrocentesis and ultrasound thereafter revealed DVT.  They are concerned about that.  Review of system is negative for any fevers, chills.   Home Medications Prior to Admission medications   Medication Sig Start Date End Date Taking? Authorizing Provider  acebutolol (SECTRAL) 200 MG capsule Take 1 capsule (200 mg total) by mouth 2 (two) times daily. 12/06/22   Yates Decamp, MD  amLODipine (NORVASC) 5 MG tablet Take 1 tablet (5 mg total) by mouth daily. 11/21/22 11/16/23  Yates Decamp, MD  apixaban (ELIQUIS) 2.5 MG TABS tablet Take 1 tablet (2.5 mg total) by mouth 2 (two) times daily. 12/06/21   Cantwell, Celeste C, PA-C  b complex vitamins tablet Take 1 tablet by mouth daily.     [provider]  Cholecalciferol (VITAMIN D) 125 MCG (5000 UT) CAPS Take 5,000 Units by mouth daily.     [provider]  CHROMIUM PO Take 1 tablet by mouth daily.    [provider]  Coenzyme Q10 (CO Q-10) 200 MG CAPS Take 200 mg by  mouth daily.     [provider]  Cyanocobalamin (B-12) 5000 MCG CAPS Take 5,000 mcg by mouth daily.    [provider]  folic acid (FOLVITE) 800 MCG tablet Take 800 mcg by mouth daily.    [provider]  hydrALAZINE (APRESOLINE) 100 MG tablet Take 1 (100 mg) tablet by mouth every morning and evening. Take 1 (50 mg) tablet daily in the afternoon. 12/06/22   Yates Decamp, MD  hydrALAZINE (APRESOLINE) 50 MG tablet Take 1 (100 mg) tablet by mouth every morning and evening. Take 1 (50 mg) tablet daily in the afternoon. 12/06/22   Yates Decamp, MD  L-Arginine 500 MG CAPS Take 500 mg by mouth daily.    [provider]  LUTEIN PO Take 1 tablet by mouth daily.     [provider]  Magnesium 200 MG TABS Take 400 mg by mouth daily.    [provider]  melatonin 5 MG TABS Take 5 mg by mouth at bedtime as needed (sleep). Patient not taking: Reported on 01/17/2023    [provider]  Menaquinone-7 (K2 PO) Take by mouth daily at 6 (six) AM.    [provider]  Probiotic Product (ALIGN PO) Take 1 capsule by mouth daily.     [provider]  rosuvastatin (CRESTOR) 10 MG tablet Take 1 tablet (10  mg total) by mouth daily. 06/19/22   Nori Riis, NP  selenium 50 MCG TABS tablet Take 50 mcg by mouth daily.    [provider]  valsartan (DIOVAN) 160 MG tablet Take 1 tablet (160 mg total) by mouth daily. 01/11/23   Yates Decamp, MD  Vitamin A 2400 MCG (8000 UT) CAPS Take 8,000 Units by mouth daily.    [provider]  vitamin C (ASCORBIC ACID) 500 MG tablet Take 500 mg by mouth daily.    [provider]  zinc gluconate 50 MG tablet Take 50 mg by mouth daily.    [provider]      Allergies    Amoxicillin, Ibuprofen, Penicillins, Ace inhibitors, Augmentin [amoxicillin-pot clavulanate], Doxycycline hyclate, Spironolactone, Atorvastatin, Azithromycin, Diltiazem, and Hydrochlorothiazide    Review of  Systems   Review of Systems  All other systems reviewed and are negative.   Physical Exam Updated Vital Signs BP (!) 173/77   Pulse (!) 58   Temp (!) 97.5 F (36.4 C) (Oral)   Resp 17   SpO2 98%  Physical Exam Vitals and nursing note reviewed.  Constitutional:      Appearance: He is well-developed.  HENT:     Head: Atraumatic.  Cardiovascular:     Rate and Rhythm: Normal rate.  Pulmonary:     Effort: Pulmonary effort is normal.  Musculoskeletal:        General: Swelling and tenderness present.     Cervical back: Neck supple.     Comments: Right knee is edematous, but there is no evidence of erythema, warmth to touch.  Tenderness over the suprapatellar region.  Skin:    General: Skin is warm.  Neurological:     Mental Status: He is alert and oriented to person, place, and time.     ED Results / Procedures / Treatments   Labs (all labs ordered are listed, but only abnormal results are displayed) Labs Reviewed - No data to display  EKG None  Radiology DG Knee Complete 4 Views Right  Result Date: 02/16/2023 CLINICAL DATA:  Knee pain and effusion. EXAM: RIGHT KNEE - COMPLETE 4+ VIEW COMPARISON:  Left knee x-ray 12/18/2013 FINDINGS: Large joint effusion is present. 8 x 9 x 8 focal density seen overlapping the tibial spines, indeterminate, but stable from 2015. Findings may represent a loose body. No definite fracture identified. There is mild lateral and patellofemoral compartment joint space narrowing and osteophyte formation compatible with degenerative change. IMPRESSION: 1. Large joint effusion. 2. 9 mm focal density overlapping the tibial spines may represent a loose body. 3. Mild-to-moderate degenerative changes. Electronically Signed   By: Darliss Cheney M.D.   On: 02/16/2023 22:28    Procedures Ultrasound ED Soft Tissue  Date/Time: 02/17/2023 12:08 AM  Performed by: Derwood Kaplan, MD Authorized by: Derwood Kaplan, MD   Procedure details:    Indications: limb  pain     Transverse view:  Visualized   Longitudinal view:  Visualized   Images: not archived   Location:    Location: lower extremity     Side:  Right Comments:     Identified significant knee effusion in the suprapatellar knee capsule .Joint Aspiration/Arthrocentesis  Date/Time: 02/17/2023 12:09 AM  Performed by: Derwood Kaplan, MD Authorized by: Derwood Kaplan, MD   Consent:    Consent obtained:  Verbal   Consent given by:  Patient   Risks, benefits, and alternatives were discussed: yes     Risks discussed:  Bleeding, infection, incomplete drainage,  nerve damage and pain   Alternatives discussed:  Delayed treatment and observation Universal protocol:    Procedure explained and questions answered to patient or proxy's satisfaction: yes     Imaging studies available: yes     Required blood products, implants, devices, and special equipment available: yes     Site/side marked: yes     Immediately prior to procedure, a time out was called: yes     Patient identity confirmed:  Arm band Location:    Location:  Knee   Knee:  R knee Anesthesia:    Anesthesia method:  Local infiltration and topical application   Local anesthetic:  Lidocaine 1% WITH epi Procedure details:    Needle gauge:  18 G   Ultrasound guidance: yes     Approach:  Lateral   Aspirate amount:  72   Aspirate characteristics:  Bloody   Steroid injected: no     Specimen collected: no   Post-procedure details:    Dressing:  Adhesive bandage   Procedure completion:  Tolerated well, no immediate complications Comments:     62 mL were aspirated with lateral approach, followed by 10 mL aspiration with medial approach     Medications Ordered in ED Medications  lidocaine-EPINEPHrine (XYLOCAINE W/EPI) 1 %-1:100000 (with pres) injection 10 mL (has no administration in time range)  acetaminophen (TYLENOL) tablet 650 mg (650 mg Oral Given 02/16/23 2218)    ED Course/ Medical Decision Making/ A&P                              Medical Decision Making Amount and/or Complexity of Data Reviewed Radiology: ordered.  Risk OTC drugs. Prescription drug management.  89 year old male comes in with chief complaint of sudden onset knee swelling and pain.  He has history of DVT, a flutter and is on Eliquis.  He also has history of left-sided osteoarthritis with similar episode few years back that revealed hemarthrosis and DVT at that time.  Differential diagnosis considered for the patient includes hemarthrosis, osteoarthritis with resultant knee effusion secondary to flareup, bursitis, ruptured Baker's cyst, DVT, septic arthritis, gouty arthritis/crystalloid arthritis.  X-ray of the knee ordered, independently interpreted by me.  There is clear evidence of knee effusion.  Bedside ultrasound revealed possible pocket of fluid buildup. Initially patient had requested if we can aspirate the fluid, as in the past he had started clotting and he required multiple attempts at drainage.  Initially I had informed him that we typically try to limit ED workup to diagnostic arthrocentesis given the risk of infection.  However, after assessing the patient ultrasound I offered the arthrocentesis for therapeutic purposes as he was in significant discomfort, and I was concerned that he might not be able to function well, and put himself at risk for fall and additional morbidity.  Patient consented to the procedure after we discussed the risk and benefit.  Patient's family was at the bedside at the time of discussion of the procedure.  Subsequently, we were able to aspirate about 72 mL of bloody fluid.  I have no clinical concerns for septic arthritis given the clinical presentation and we will not send the bloody fluid for analysis.   We will still proceed with the ultrasound plan for tomorrow to rule out Baker's cyst.  My suspicion for DVT is low, but he had DVT last time with similar presentation on the left side, at that time he  was also on blood  thinners.  Final Clinical Impression(s) / ED Diagnoses Final diagnoses:  Hemarthrosis    Rx / DC Orders ED Discharge Orders          Ordered    Lower Ext Right Venous US       Comments: IMPORTANT PATIENT INSTRUCTIONS:  You have been scheduled for an Outpatient Ultrasound.    Your appointment has been scheduled for:  _______ am/pm on _______________ (date).  If your appointment is scheduled for a Saturday, Sunday or holiday, please go to the [employer] at [facility] Registration Desk at least 15 minutes prior to your appointment time and tell them you are there for an ultrasound.    If your appointment is scheduled for a weekday (Monday - Friday), please go directly to the [facility] at [facility] reception area at least 15 minutes prior to your appointment time and tell them you are there for an ultrasound.  Please call [phone] with questions.   02/17/23 0003              Derwood Kaplan, MD 02/17/23 [id number]

## 2023-02-17 NOTE — Discharge Instructions (Signed)
You are seen in the emergency room for knee swelling and pain.  Read the instructions provided on the knee effusion, as it goes over the possible cause.  We were able to complete arthrocentesis in the emergency room that yielded about 72 mL of bloody fluid.  Please read the instructions provided on care after to reduce discomfort.  Outpatient ultrasound has been ordered to rule out Baker's cyst or blood clot in the leg.  That study should be done tomorrow at this facility.  As discussed, the procedure increases the risk of infection.  If you start having increasing pain, swelling, redness please return to the emergency room immediately.

## 2023-02-17 NOTE — ED Notes (Signed)
Spoke with radiology. Aware that pt will return at 3pm today for his u/s

## 2023-02-19 ENCOUNTER — Encounter (HOSPITAL_BASED_OUTPATIENT_CLINIC_OR_DEPARTMENT_OTHER): Payer: Medicare Other | Admitting: Physical Therapy

## 2023-02-20 DIAGNOSIS — I10 Essential (primary) hypertension: Secondary | ICD-10-CM | POA: Diagnosis not present

## 2023-02-21 ENCOUNTER — Encounter (HOSPITAL_BASED_OUTPATIENT_CLINIC_OR_DEPARTMENT_OTHER): Payer: Medicare Other | Admitting: Physical Therapy

## 2023-02-21 DIAGNOSIS — M1711 Unilateral primary osteoarthritis, right knee: Secondary | ICD-10-CM | POA: Diagnosis not present

## 2023-02-21 DIAGNOSIS — M25561 Pain in right knee: Secondary | ICD-10-CM | POA: Diagnosis not present

## 2023-02-25 DIAGNOSIS — M25561 Pain in right knee: Secondary | ICD-10-CM | POA: Diagnosis not present

## 2023-02-26 ENCOUNTER — Encounter (HOSPITAL_BASED_OUTPATIENT_CLINIC_OR_DEPARTMENT_OTHER): Payer: Medicare Other | Admitting: Physical Therapy

## 2023-02-28 ENCOUNTER — Encounter (HOSPITAL_BASED_OUTPATIENT_CLINIC_OR_DEPARTMENT_OTHER): Payer: Medicare Other | Admitting: Physical Therapy

## 2023-03-04 DIAGNOSIS — M1711 Unilateral primary osteoarthritis, right knee: Secondary | ICD-10-CM | POA: Diagnosis not present

## 2023-03-05 ENCOUNTER — Encounter (HOSPITAL_BASED_OUTPATIENT_CLINIC_OR_DEPARTMENT_OTHER): Payer: Medicare Other | Admitting: Physical Therapy

## 2023-03-05 DIAGNOSIS — M1711 Unilateral primary osteoarthritis, right knee: Secondary | ICD-10-CM | POA: Diagnosis not present

## 2023-03-06 DIAGNOSIS — M1711 Unilateral primary osteoarthritis, right knee: Secondary | ICD-10-CM | POA: Diagnosis not present

## 2023-03-06 DIAGNOSIS — M25661 Stiffness of right knee, not elsewhere classified: Secondary | ICD-10-CM | POA: Diagnosis not present

## 2023-03-07 ENCOUNTER — Encounter (HOSPITAL_BASED_OUTPATIENT_CLINIC_OR_DEPARTMENT_OTHER): Payer: Medicare Other | Admitting: Physical Therapy

## 2023-03-07 DIAGNOSIS — M25661 Stiffness of right knee, not elsewhere classified: Secondary | ICD-10-CM | POA: Diagnosis not present

## 2023-03-07 DIAGNOSIS — M1711 Unilateral primary osteoarthritis, right knee: Secondary | ICD-10-CM | POA: Diagnosis not present

## 2023-03-08 DIAGNOSIS — R3912 Poor urinary stream: Secondary | ICD-10-CM | POA: Diagnosis not present

## 2023-03-12 ENCOUNTER — Encounter (HOSPITAL_BASED_OUTPATIENT_CLINIC_OR_DEPARTMENT_OTHER): Payer: Medicare Other | Admitting: Physical Therapy

## 2023-03-12 DIAGNOSIS — M1711 Unilateral primary osteoarthritis, right knee: Secondary | ICD-10-CM | POA: Diagnosis not present

## 2023-03-12 DIAGNOSIS — M25661 Stiffness of right knee, not elsewhere classified: Secondary | ICD-10-CM | POA: Diagnosis not present

## 2023-03-14 ENCOUNTER — Encounter (HOSPITAL_BASED_OUTPATIENT_CLINIC_OR_DEPARTMENT_OTHER): Payer: Medicare Other | Admitting: Physical Therapy

## 2023-03-18 DIAGNOSIS — M25661 Stiffness of right knee, not elsewhere classified: Secondary | ICD-10-CM | POA: Diagnosis not present

## 2023-03-18 DIAGNOSIS — M1711 Unilateral primary osteoarthritis, right knee: Secondary | ICD-10-CM | POA: Diagnosis not present

## 2023-03-20 DIAGNOSIS — M1712 Unilateral primary osteoarthritis, left knee: Secondary | ICD-10-CM | POA: Diagnosis not present

## 2023-03-20 DIAGNOSIS — M25661 Stiffness of right knee, not elsewhere classified: Secondary | ICD-10-CM | POA: Diagnosis not present

## 2023-03-20 DIAGNOSIS — M1711 Unilateral primary osteoarthritis, right knee: Secondary | ICD-10-CM | POA: Diagnosis not present

## 2023-03-25 DIAGNOSIS — M1711 Unilateral primary osteoarthritis, right knee: Secondary | ICD-10-CM | POA: Diagnosis not present

## 2023-03-25 DIAGNOSIS — M25661 Stiffness of right knee, not elsewhere classified: Secondary | ICD-10-CM | POA: Diagnosis not present

## 2023-03-27 DIAGNOSIS — M1711 Unilateral primary osteoarthritis, right knee: Secondary | ICD-10-CM | POA: Diagnosis not present

## 2023-03-27 DIAGNOSIS — M25661 Stiffness of right knee, not elsewhere classified: Secondary | ICD-10-CM | POA: Diagnosis not present

## 2023-04-02 DIAGNOSIS — M25661 Stiffness of right knee, not elsewhere classified: Secondary | ICD-10-CM | POA: Diagnosis not present

## 2023-04-02 DIAGNOSIS — M1711 Unilateral primary osteoarthritis, right knee: Secondary | ICD-10-CM | POA: Diagnosis not present

## 2023-04-04 DIAGNOSIS — M1711 Unilateral primary osteoarthritis, right knee: Secondary | ICD-10-CM | POA: Diagnosis not present

## 2023-04-04 DIAGNOSIS — Z86010 Personal history of colonic polyps: Secondary | ICD-10-CM | POA: Diagnosis not present

## 2023-04-04 DIAGNOSIS — M25661 Stiffness of right knee, not elsewhere classified: Secondary | ICD-10-CM | POA: Diagnosis not present

## 2023-04-04 DIAGNOSIS — K5900 Constipation, unspecified: Secondary | ICD-10-CM | POA: Diagnosis not present

## 2023-04-04 DIAGNOSIS — K625 Hemorrhage of anus and rectum: Secondary | ICD-10-CM | POA: Diagnosis not present

## 2023-04-08 ENCOUNTER — Other Ambulatory Visit: Payer: Medicare Other

## 2023-04-09 ENCOUNTER — Ambulatory Visit: Payer: Medicare Other

## 2023-04-09 DIAGNOSIS — I5032 Chronic diastolic (congestive) heart failure: Secondary | ICD-10-CM

## 2023-04-09 DIAGNOSIS — I480 Paroxysmal atrial fibrillation: Secondary | ICD-10-CM

## 2023-04-10 DIAGNOSIS — M25661 Stiffness of right knee, not elsewhere classified: Secondary | ICD-10-CM | POA: Diagnosis not present

## 2023-04-10 DIAGNOSIS — M1711 Unilateral primary osteoarthritis, right knee: Secondary | ICD-10-CM | POA: Diagnosis not present

## 2023-04-12 DIAGNOSIS — M1711 Unilateral primary osteoarthritis, right knee: Secondary | ICD-10-CM | POA: Diagnosis not present

## 2023-04-12 DIAGNOSIS — M25661 Stiffness of right knee, not elsewhere classified: Secondary | ICD-10-CM | POA: Diagnosis not present

## 2023-04-16 ENCOUNTER — Encounter: Payer: Self-pay | Admitting: Cardiology

## 2023-04-16 ENCOUNTER — Ambulatory Visit: Payer: Medicare Other | Admitting: Cardiology

## 2023-04-16 VITALS — BP 161/55 | HR 64 | Resp 16 | Ht 67.0 in | Wt 169.2 lb

## 2023-04-16 DIAGNOSIS — I5032 Chronic diastolic (congestive) heart failure: Secondary | ICD-10-CM | POA: Diagnosis not present

## 2023-04-16 DIAGNOSIS — I10 Essential (primary) hypertension: Secondary | ICD-10-CM | POA: Diagnosis not present

## 2023-04-16 DIAGNOSIS — I480 Paroxysmal atrial fibrillation: Secondary | ICD-10-CM

## 2023-04-16 DIAGNOSIS — Z9189 Other specified personal risk factors, not elsewhere classified: Secondary | ICD-10-CM

## 2023-04-16 DIAGNOSIS — I359 Nonrheumatic aortic valve disorder, unspecified: Secondary | ICD-10-CM

## 2023-04-16 NOTE — Progress Notes (Signed)
Echocardiogram 04/09/2023: Left ventricle cavity is normal in size. Mild concentric hypertrophy of the left ventricle. Normal global wall motion. Normal LV systolic function with EF 69%. Doppler evidence of grade I (impaired) diastolic dysfunction, normal LAP.  Left atrial cavity is mildly dilated at 40.5 ml/m^2. Trileaflet aortic valve with mild calcification. Mild aortic stenosis. Vmax 1.8 m/sec, mean PG 7 mmHg, AVA 1.2 cm by continuity equation. Trace aortic regurgitation. Mild calcification of the mitral valve annulus. Mild mitral valve leaflet thickening. Trace mitral regurgitation. Mild to moderate tricuspid regurgitation. Estimated pulmonary artery systolic pressure 26 mmHg. Personally compared with previous study on 02/23/2022, septal bulge less prominent. AI and TR were reported moderate then, estimated PASP was 39 mmHg.

## 2023-04-16 NOTE — Progress Notes (Signed)
Primary Physician/Referring:  Lorenda Ishihara, MD  Patient ID: Dalton Cardenas, male    DOB: 25-May-1934, 89 y.o.   MRN: [id number]  Chief Complaint  Patient presents with   Paroxysmal atrial fibrillation    Essential hypertension, benign   Follow-up   HPI:    Dalton Cardenas  is a 89 y.o. 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 He was found to be in atrial fibrillation and acute diastolic heart failure and underwent direct-current cardioversion on 09/04/2022.  This is 72-month office visit for heart failure, atrial fibrillation and hypertension.  He has not had any recurrence of heart failure symptoms.  His main concerns are recurrent knee joint effusion and bleeding.  Past Medical History:  Diagnosis Date   Coronary artery disease    60-70% circumflex (cath in [place] 08/2010).   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 Holter monitoring 2011 - AV node reentry, multifocal atrial tachycardia, and resting bradycardia. b. previously tx with Abana in [place].    Social History   Tobacco Use   Smoking status: Never   Smokeless tobacco: Never  Substance Use Topics   Alcohol use: No   ROS  Review of Systems  Constitutional: Negative for malaise/fatigue.  Cardiovascular:  Positive for palpitations. Negative for chest pain, dyspnea on exertion and leg swelling.    Objective  Blood pressure (!) 161/55, pulse  64, resp. rate 16, height 5\' 7"  (1.702 m), weight 169 lb 3.2 oz (76.7 kg), SpO2 96%.     04/16/2023    4:11 PM 04/16/2023    4:02 PM 02/17/2023   12:49 AM  Vitals with BMI  Height  5\' 7"    Weight  169 lbs 3 oz   BMI  26.49   Systolic 161 162 914  Diastolic 55 69 71  Pulse 64 54 57     Physical Exam Vitals reviewed.  Neck:     Vascular: JVD present. Carotid bruit: soft bilateral. Cardiovascular:     Rate and Rhythm: Normal rate. Rhythm irregular.     Pulses: Intact distal pulses.     Heart sounds: S1 normal and S2 normal. Murmur heard.     Harsh midsystolic murmur is present with a grade of 2/6 at the upper right sternal border.     Early diastolic murmur is present with a grade of 2/4 at the upper right sternal border radiating to the apex.     No gallop.  Pulmonary:     Effort: Pulmonary effort is normal. No respiratory distress.     Breath sounds: No wheezing, rhonchi or rales.  Abdominal:     General: Bowel sounds are normal.     Palpations: Abdomen is soft.  Musculoskeletal:     Right lower leg: Edema (trace) present.     Left lower leg: Edema (trace) present.   Laboratory examination:   Lab Results  Component Value Date   NA  138 09/03/2022   K 4.4 09/03/2022   CO2 20 09/03/2022   GLUCOSE 121 (H) 09/03/2022   BUN 20 09/03/2022   CREATININE 1.10 09/03/2022   CALCIUM 9.4 09/03/2022   EGFR 65 09/03/2022   GFRNONAA >60 03/15/2021       Latest Ref Rng & Units 09/03/2022    1:12 PM 03/15/2021   12:23 AM 11/08/2020    2:17 PM  CMP  Glucose 70 - 99 mg/dL 147  829  562   BUN 8 - 27 mg/dL 20  17  20    Creatinine 0.76 - 1.27 mg/dL 1.30  8.65  7.84   Sodium 134 - 144 mmol/L 138  135  136   Potassium 3.5 - 5.2 mmol/L 4.4  3.5  4.8   Chloride 96 - 106 mmol/L 102  102  104   CO2 20 - 29 mmol/L 20  24  24    Calcium 8.6 - 10.2 mg/dL 9.4  9.5  9.4   Total Protein 6.5 - 8.1 g/dL   7.5   Total Bilirubin 0.3 - 1.2 mg/dL   0.9   Alkaline Phos 38 - 126 U/L   71   AST 15 - 41  U/L   23   ALT 0 - 44 U/L   21       Latest Ref Rng & Units 09/03/2022    1:12 PM 03/15/2021   12:23 AM 11/08/2020    2:17 PM  CBC  WBC 3.4 - 10.8 x10E3/uL 8.6  10.8  8.2   Hemoglobin 13.0 - 17.7 g/dL 69.6  29.5  28.4   Hematocrit 37.5 - 51.0 % 37.0  41.0  36.1   Platelets 150 - 450 x10E3/uL 295  243  267    External labs :   Cholesterol, total 191.000 m 10/22/2022 HDL 59.000 mg 10/22/2022 LDL 105.000 m 10/22/2022 Triglycerides 158.000 m 10/22/2022  A1C 6.100 mg/ 10/22/2022  Hemoglobin 12.600 g/d 10/22/2022  Creatinine, Serum 1.060 mg/ 10/22/2022 CrCl Est 43.23 10/22/2022 eGFR 68.000 calc 10/22/2022  Potassium 4.000 mm 10/22/2022 Magnesium 2.900 MG/ 10/22/2022 ALT (SGPT) 19.000 U/L 10/22/2022  Radiology:   Chest x-ray 06/30/2020: Lungs are clear.  No pleural effusion or pneumothorax. The heart is normal in size.  Thoracic aortic atherosclerosis. Degenerative changes of the visualized thoracolumbar spine.  CT angiogram chest 11/11/2020: 1. No demonstrable pulmonary embolus. No thoracic aortic aneurysm or dissection. There are foci of aortic atherosclerosis as well as foci of great vessel and coronary artery calcification.   2. Mild atelectatic change in the left lower lobe and inferior lingula. No edema or airspace opacity. Multiple 1-2 mm nodular opacities noted in each upper lobe. No larger pulmonary nodular opacities evident. No follow-up needed if patient is low-risk (and has no known or suspected primary neoplasm). Non-contrast chest CT can be considered in 12 months if patient is high-risk.  Cardiac Studies:   CT angio head W or WO Contrast 07/12/2020:  No acute intracranial abnormality. Mild chronic microvascular ischemic changes. Chronic right caudate infarct. Short segment marked stenosis of the distal intracranial right vertebral artery.  Carotid artery duplex 08/02/2022: Right Carotid: Velocities in the right ICA are consistent with a 1-39% stenosis.  Left Carotid: Patent  stent with no evidence for restenosis. The ECA  appears >50% stenosed.  Vertebrals:  Left vertebral artery demonstrates antegrade flow. 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  Subclavians: Right subclavian artery was stenotic. Normal flow  hemodynamics were seen in the  left subclavian artery.   Zio Patch Extended out patient EKG monitoring 13 days starting 07/03/2022:   Dominant rhythm Sinus.  First degree AV block was present. Ventricular bigeminy was present, longest episode 4.6 seconds. HR 39-92 bpm. Avg HR 55 bpm. No atrial fibrillation/atrial flutter/SVT/VT/high grade AV block, sinus pause >3sec noted. 14 runs of probable atrial tachycardia with fastest interval lasting 6 beats with a max heart rate of 113 bpm. Isolated SVE <1.0%, SVE Couplets <1.0%, SVE Triplets <1.0% Isolated VE <1%, VE Couplets <1.0%, VE Triplets <1.0% Symptoms reported: None  Echocardiogram 04/09/2023: Left ventricle cavity is normal in size. Mild concentric hypertrophy of the left ventricle. Normal global wall motion. Normal LV systolic function with EF 69%. Doppler evidence of grade I (impaired) diastolic dysfunction, normal LAP.  Left atrial cavity is mildly dilated at 40.5 ml/m^2. Trileaflet aortic valve with mild calcification. Mild aortic stenosis. Vmax 1.8 m/sec, mean PG 7 mmHg, AVA 1.2 cm by continuity equation. Mild aortic regurgitation. Mild calcification of the mitral valve annulus. Mild mitral valve leaflet thickening. Trace mitral regurgitation. Mild to moderate tricuspid regurgitation. Estimated pulmonary artery systolic pressure 26 mmHg. Personally compared with previous study on 02/23/2022,  AI and TR were reported mild to moderate then, estimated PASP was 39 mmHg.  EKG:    EKG 04/16/2023: Sinus rhythm with first-degree AV block at rate of 52 bpm, left atrial enlargement, left anterior fascicular block.  LVH.  Compared to 10/15/2022, no significant  change.  Allergies & Medications   Allergies  Allergen Reactions   Amoxicillin Diarrhea    GI upset: Bleeding diarrhea    Ibuprofen Diarrhea    Bloody diarrhea    Penicillins Diarrhea    Bloody diarrhea    Ace Inhibitors Swelling    Face swelling    Augmentin [Amoxicillin-Pot Clavulanate] Diarrhea   Doxycycline Hyclate Diarrhea   Spironolactone     Hyponatremia    Atorvastatin     Weakness    Azithromycin     Stomach ache    Diltiazem Nausea Only    Upset stomach per patient   Hydrochlorothiazide Other (See Comments)    Photosensitivity    Current Outpatient Medications:    acebutolol (SECTRAL) 200 MG capsule, Take 1 capsule (200 mg total) by mouth 2 (two) times daily., Disp: 180 capsule, Rfl: 1   amLODipine (NORVASC) 5 MG tablet, Take 1 tablet (5 mg total) by mouth daily., Disp: 90 tablet, Rfl: 3   apixaban (ELIQUIS) 2.5 MG TABS tablet, Take 1 tablet (2.5 mg total) by mouth 2 (two) times daily., Disp: 180 tablet, Rfl: 3   b complex vitamins tablet, Take 1 tablet by mouth daily. , Disp: , Rfl:    Cholecalciferol (VITAMIN D) 125 MCG (5000 UT) CAPS, Take 5,000 Units by mouth daily. , Disp: , Rfl:    CHROMIUM PO, Take 1 tablet by mouth daily., Disp: , Rfl:    Coenzyme Q10 (CO Q-10) 200 MG CAPS, Take 200 mg by mouth daily. , Disp: , Rfl:    Cyanocobalamin (B-12) 5000 MCG CAPS, Take 5,000 mcg by mouth daily., Disp: , Rfl:    folic acid (FOLVITE) 800 MCG tablet, Take 800 mcg by mouth daily., Disp: , Rfl:    hydrALAZINE (APRESOLINE) 100 MG tablet, Take 1 (100 mg) tablet by mouth every morning and evening. Take 1 (50 mg) tablet daily in the afternoon., Disp: 180 tablet, Rfl: 3   hydrALAZINE (APRESOLINE) 50 MG tablet, Take 1 (100 mg) tablet by mouth every morning and evening.  Take 1 (50 mg) tablet daily in the afternoon., Disp: 90 tablet, Rfl: 3   L-Arginine 500 MG CAPS, Take 500 mg by mouth daily., Disp: , Rfl:    LUTEIN PO, Take 1 tablet by mouth daily. , Disp: , Rfl:     Magnesium 200 MG TABS, Take 400 mg by mouth daily., Disp: , Rfl:    melatonin 5 MG TABS, Take 5 mg by mouth at bedtime as needed (sleep)., Disp: , Rfl:    Menaquinone-7 (K2 PO), Take by mouth daily at 6 (six) AM., Disp: , Rfl:    Probiotic Product (ALIGN PO), Take 1 capsule by mouth daily. , Disp: , Rfl:    rosuvastatin (CRESTOR) 10 MG tablet, Take 1 tablet (10 mg total) by mouth daily., Disp: 90 tablet, Rfl: 3   selenium 50 MCG TABS tablet, Take 50 mcg by mouth daily., Disp: , Rfl:    valsartan (DIOVAN) 160 MG tablet, Take 1 tablet (160 mg total) by mouth daily., Disp: 90 tablet, Rfl: 3   Vitamin A 2400 MCG (8000 UT) CAPS, Take 8,000 Units by mouth daily., Disp: , Rfl:    vitamin C (ASCORBIC ACID) 500 MG tablet, Take 500 mg by mouth daily., Disp: , Rfl:    zinc gluconate 50 MG tablet, Take 50 mg by mouth daily., Disp: , Rfl:    Assessment     ICD-10-CM   1. Paroxysmal atrial fibrillation (HCC)  I48.0 EKG 12-Lead    Ambulatory referral to Cardiac Electrophysiology    2. At high risk for bleeding  Z91.89 Ambulatory referral to Cardiac Electrophysiology    3. Aortic valve disorder  I35.9     4. Chronic diastolic (congestive) heart failure (HCC)  I50.32     5. Essential hypertension, benign  I10     6. White coat syndrome with diagnosis of hypertension  I10       CHA2DS2-VASc Score is 4.  Yearly risk of stroke: 4.8% (A, HTN, Vasc Dz).  Score of 1=0.6; 2=2.2; 3=3.2; 4=4.8; 5=7.2; 6=9.8; 7=>9.8) -(CHF; HTN; vasc disease DM,  Male = 1; Age <65 =0; 65-74 = 1,  >75 =2; stroke/embolism= 2).    No orders of the defined types were placed in this encounter.  There are no discontinued medications.   Recommendations:   Dalton Cardenas  is a 89 y.o. 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  He was found to be in atrial fibrillation. He underwent direct-current cardioversion on 09/04/2022 due to acute decompensated CHF. This is a 6 month visit.   1. Paroxysmal atrial fibrillation Dalton Cardenas) Patient is maintaining sinus rhythm, since cardioversion in December 2023.  He is presently on Eliquis, tolerating this well however he has had recurrent bilateral knee effusions and has limited his activity and also his travel.  In view of recurrent effusion, she admission was made to consider left atrial appendage closure with Watchman device.  I would like to make a referral for the same.  [facility] Referral for Left Atrial Appendage Closure with Non-Valvular Atrial Fibrillation   Dalton Cardenas is a 89 y.o. male is being referred to the [facility] Team for evaluation for Left Atrial Appendage Closure with Watchman device for the management of stroke risk resulting form non-valvular atrial fibrillation.  Base upon Dalton Cardenas's history, he is felt to be a poor candidate for long-term anticoagulation because of a history of bleeding (e.g. intracerebral, subdural, GI, retro-peritoneal), intolerance to oral anticoagulation, and recurrent bilateral knee joint .  The patient has a HAS-BLED score of 3 indicating a Yearly Major Bleeding Risk of 3 %.   His CHADS2-VASc Score is   with an unadjusted Ischemic Stroke Rate ( 5% per year) ChadsVasc-2 score of 4.  His stroke risk necessitates a strategy of stroke prevention with either long-term oral anticoagulation or left atrial appendage occlusion therapy. We have discussed their bleeding risk in the context of their comorbid medical problems, as well as the rationale for referral for evaluation of Watchman left atrial appendage occlusion therapy. While the patient is at high long-term bleeding risk, they may be appropriate for short-term anticoagulation. Based on this individual patient's  stroke and bleeding risk, a shared decision has been made to refer the patient for consideration of Watchman left atrial appendage closure utilizing the [employer] shared decision tool.  Extensive discussion regarding atrial fibrillation ablation, anticoagulation, risks and benefits, discussion regarding Watchman device.  - EKG 12-Lead  2. Aortic valve disorder Patient has very mild/trace aortic valve stenosis and very mild aortic regurgitation.  I personally reviewed the images, although aortic regurgitation was reported as trace, clearly he has mild aortic regurgitation but is subclinical in consequence.  He is presently doing well.  3. Chronic diastolic (congestive) heart failure (HCC) Fortunately since maintaining sinus rhythm, he has not had any diastolic heart failure.  He is presently well compensated.  4. Essential hypertension, benign Blood pressure is controlled at home and he does have whitecoat hypertension with marked elevation in blood pressure while he is visiting [place].  I did not make any changes to his medications.  5. White coat syndrome with diagnosis of hypertension Please see above.   Yates Decamp, MD, [facility] 04/16/2023, 4:35 PM Office: [phone] Fax: [phone] Pager: [phone]

## 2023-04-17 ENCOUNTER — Ambulatory Visit: Payer: Medicare Other | Admitting: Cardiology

## 2023-04-17 DIAGNOSIS — M25661 Stiffness of right knee, not elsewhere classified: Secondary | ICD-10-CM | POA: Diagnosis not present

## 2023-04-17 DIAGNOSIS — M1711 Unilateral primary osteoarthritis, right knee: Secondary | ICD-10-CM | POA: Diagnosis not present

## 2023-04-23 DIAGNOSIS — M1712 Unilateral primary osteoarthritis, left knee: Secondary | ICD-10-CM | POA: Diagnosis not present

## 2023-04-23 DIAGNOSIS — M1711 Unilateral primary osteoarthritis, right knee: Secondary | ICD-10-CM | POA: Diagnosis not present

## 2023-04-24 DIAGNOSIS — M1711 Unilateral primary osteoarthritis, right knee: Secondary | ICD-10-CM | POA: Diagnosis not present

## 2023-04-24 DIAGNOSIS — M25661 Stiffness of right knee, not elsewhere classified: Secondary | ICD-10-CM | POA: Diagnosis not present

## 2023-05-08 ENCOUNTER — Ambulatory Visit: Payer: Medicare Other | Admitting: Cardiology

## 2023-05-13 DIAGNOSIS — M25661 Stiffness of right knee, not elsewhere classified: Secondary | ICD-10-CM | POA: Diagnosis not present

## 2023-05-13 DIAGNOSIS — M1711 Unilateral primary osteoarthritis, right knee: Secondary | ICD-10-CM | POA: Diagnosis not present

## 2023-05-22 ENCOUNTER — Encounter: Payer: Self-pay | Admitting: Cardiology

## 2023-05-22 ENCOUNTER — Ambulatory Visit: Payer: Medicare Other | Attending: Cardiology | Admitting: Cardiology

## 2023-05-22 VITALS — BP 146/64 | HR 65 | Ht 66.5 in | Wt 160.1 lb

## 2023-05-22 DIAGNOSIS — I10 Essential (primary) hypertension: Secondary | ICD-10-CM | POA: Diagnosis not present

## 2023-05-22 DIAGNOSIS — I480 Paroxysmal atrial fibrillation: Secondary | ICD-10-CM | POA: Diagnosis not present

## 2023-05-22 DIAGNOSIS — M2500 Hemarthrosis, unspecified joint: Secondary | ICD-10-CM | POA: Diagnosis not present

## 2023-05-22 NOTE — Patient Instructions (Signed)
Medication Instructions:  Your physician recommends that you continue on your current medications as directed. Please refer to the Current Medication list given to you today.  *If you need a refill on your cardiac medications before your next appointment, please call your pharmacy*  Follow-Up: At [facility], you and your health needs are our priority.  As part of our continuing mission to provide you with exceptional heart care, we have created designated Provider Care Teams.  These Care Teams include your primary Cardiologist (physician) and Advanced Practice Providers (APPs -  Physician Assistants and Nurse Practitioners) who all work together to provide you with the care you need, when you need it.  Your next appointment:   As needed with Dr. Lambert 

## 2023-05-22 NOTE — Progress Notes (Signed)
Electrophysiology Office Note:    Date:  05/22/2023   ID:  Dalton Cardenas, DOB 10/25/1933, MRN [id number]  [facility] Cardiologist:  None  [facility] Electrophysiologist:  Lanier Prude, MD   Referring MD: Lorenda Ishihara,*   Chief Complaint: Atrial fibrillation  History of Present Illness:    Dalton Cardenas is a 89 y.o. malewho I am seeing today for an evaluation of atrial fibrillation at the request of Dr. Jacinto Halim.  The patient was last seen by Dr. Jacinto Halim on April 16, 2023.  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  At the appointment with Dr. Jacinto Halim he reported recurrent bloody effusions in the knee.  He has had multiple episodes of hemarthrosis.  He has a history of unprovoked DVT in February 2022.  No PE was identified at that time.  The patient saw a hematologist.  Workup was unrevealing.  No preceding surgeries, injuries, illnesses, long travel.  Interestingly, he was taking anticoagulation at the time of the DVT diagnosis according to the patient's wife.  He is referred to discuss left atrial appendage occlusion as a mechanism to avoid long-term exposure to anticoagulation.      Their past medical, social and family history was reveiwed.   ROS:   Please see the history of present illness.    All other systems reviewed and are negative.  EKGs/Labs/Other Studies Reviewed:    The following studies were reviewed today:  April 15, 2023 echo Report only) EF 69% Mildly dilated left atrium Mild aortic stenosis Trace MR         Physical Exam:    VS:  BP (!) 146/64 (BP Location: Left Arm, Patient Position: Sitting, Cuff Size: Normal)   Pulse 65   Ht 5' 6.5" (1.689 m)   Wt 160 lb 2 oz (72.6 kg)   SpO2 95%   BMI 25.46 kg/m     Wt Readings from Last 3  Encounters:  05/22/23 160 lb 2 oz (72.6 kg)  04/16/23 169 lb 3.2 oz (76.7 kg)  10/15/22 161 lb 12.8 oz (73.4 kg)     GEN:  Well nourished, well developed in no acute distress.  Appears younger than stated age CARDIAC: RRR, no murmurs, rubs, gallops RESPIRATORY:  Clear to auscultation without rales, wheezing or rhonchi       ASSESSMENT AND PLAN:    1. Paroxysmal atrial fibrillation (HCC)   2. Hemarthrosis   3. Primary hypertension     #Atrial fibrillation and flutter #Recurrent hemarthrosis Currently on Eliquis for stroke prophylaxis but prefers a long-term stroke risk mitigation strategy that avoids long-term exposure anticoagulation given his recurrent hemarthrosis.  Have discussed the role of left atrial appendage occlusion and stroke risk reduction.  Unfortunately, the patient has a history of unprovoked DVT which complicates the idea of pursuing left atrial appendage occlusion.  While I can reduce his stroke risk related to A-fib related left atrial appendage thrombus, removing the blood thinner would put him at risk for recurrent DVT.  For this reason, I do not think he is an acceptable candidate to proceed with watchman implant.  This is been discussed in detail the patient and his wife during today's visit.  ---------  HAS-BLED score 3 Hypertension Yes  Abnormal renal and liver function (Dialysis, transplant, Cr >2.26 mg/dL /Cirrhosis or Bilirubin >2x Normal or AST/ALT/AP >3x  Normal) No  Stroke No  Bleeding Yes  Labile INR (Unstable/high INR) No  Elderly (>65) Yes  Drugs or alcohol (? 8 drinks/week, anti-plt or NSAID) No   CHA2DS2-VASc Score = 4  The patient's score is based upon: CHF History: 0 HTN History: 1 Diabetes History: 0 Stroke History: 0 Vascular Disease History: 1 Age Score: 2 Gender Score: 0   #Hypertension Above goal today.  Recommend checking blood pressures 1-2 times per week at home and recording the values.  Recommend bringing these recordings to  the primary care physician. Continue valsartan, hydralazine, amlodipine.    Signed, Rossie Muskrat. Lalla Brothers, MD, [facility], [facility] 05/22/2023 2:13 PM    Electrophysiology  [facility]

## 2023-05-23 DIAGNOSIS — M1711 Unilateral primary osteoarthritis, right knee: Secondary | ICD-10-CM | POA: Diagnosis not present

## 2023-05-23 DIAGNOSIS — M25661 Stiffness of right knee, not elsewhere classified: Secondary | ICD-10-CM | POA: Diagnosis not present

## 2023-05-28 DIAGNOSIS — M25661 Stiffness of right knee, not elsewhere classified: Secondary | ICD-10-CM | POA: Diagnosis not present

## 2023-05-28 DIAGNOSIS — M1711 Unilateral primary osteoarthritis, right knee: Secondary | ICD-10-CM | POA: Diagnosis not present

## 2023-05-31 DIAGNOSIS — M1612 Unilateral primary osteoarthritis, left hip: Secondary | ICD-10-CM | POA: Diagnosis not present

## 2023-05-31 DIAGNOSIS — M160 Bilateral primary osteoarthritis of hip: Secondary | ICD-10-CM | POA: Diagnosis not present

## 2023-06-06 DIAGNOSIS — M25661 Stiffness of right knee, not elsewhere classified: Secondary | ICD-10-CM | POA: Diagnosis not present

## 2023-06-06 DIAGNOSIS — M1711 Unilateral primary osteoarthritis, right knee: Secondary | ICD-10-CM | POA: Diagnosis not present

## 2023-06-11 DIAGNOSIS — M7062 Trochanteric bursitis, left hip: Secondary | ICD-10-CM | POA: Diagnosis not present

## 2023-06-11 DIAGNOSIS — M1711 Unilateral primary osteoarthritis, right knee: Secondary | ICD-10-CM | POA: Diagnosis not present

## 2023-06-11 DIAGNOSIS — M25661 Stiffness of right knee, not elsewhere classified: Secondary | ICD-10-CM | POA: Diagnosis not present

## 2023-06-18 DIAGNOSIS — M1711 Unilateral primary osteoarthritis, right knee: Secondary | ICD-10-CM | POA: Diagnosis not present

## 2023-06-18 DIAGNOSIS — M25661 Stiffness of right knee, not elsewhere classified: Secondary | ICD-10-CM | POA: Diagnosis not present

## 2023-06-18 DIAGNOSIS — M7062 Trochanteric bursitis, left hip: Secondary | ICD-10-CM | POA: Diagnosis not present

## 2023-06-25 DIAGNOSIS — M7062 Trochanteric bursitis, left hip: Secondary | ICD-10-CM | POA: Diagnosis not present

## 2023-06-25 DIAGNOSIS — M1711 Unilateral primary osteoarthritis, right knee: Secondary | ICD-10-CM | POA: Diagnosis not present

## 2023-06-25 DIAGNOSIS — M25661 Stiffness of right knee, not elsewhere classified: Secondary | ICD-10-CM | POA: Diagnosis not present

## 2023-07-02 DIAGNOSIS — M25661 Stiffness of right knee, not elsewhere classified: Secondary | ICD-10-CM | POA: Diagnosis not present

## 2023-07-02 DIAGNOSIS — M1711 Unilateral primary osteoarthritis, right knee: Secondary | ICD-10-CM | POA: Diagnosis not present

## 2023-07-02 DIAGNOSIS — M7062 Trochanteric bursitis, left hip: Secondary | ICD-10-CM | POA: Diagnosis not present

## 2023-07-06 ENCOUNTER — Other Ambulatory Visit: Payer: Self-pay | Admitting: Cardiology

## 2023-07-06 DIAGNOSIS — I10 Essential (primary) hypertension: Secondary | ICD-10-CM

## 2023-07-10 DIAGNOSIS — M1711 Unilateral primary osteoarthritis, right knee: Secondary | ICD-10-CM | POA: Diagnosis not present

## 2023-07-10 DIAGNOSIS — M7062 Trochanteric bursitis, left hip: Secondary | ICD-10-CM | POA: Diagnosis not present

## 2023-07-10 DIAGNOSIS — M25661 Stiffness of right knee, not elsewhere classified: Secondary | ICD-10-CM | POA: Diagnosis not present

## 2023-07-16 DIAGNOSIS — M7062 Trochanteric bursitis, left hip: Secondary | ICD-10-CM | POA: Diagnosis not present

## 2023-07-16 DIAGNOSIS — M1711 Unilateral primary osteoarthritis, right knee: Secondary | ICD-10-CM | POA: Diagnosis not present

## 2023-07-16 DIAGNOSIS — M25661 Stiffness of right knee, not elsewhere classified: Secondary | ICD-10-CM | POA: Diagnosis not present

## 2023-07-22 ENCOUNTER — Other Ambulatory Visit: Payer: Self-pay

## 2023-07-23 DIAGNOSIS — M1711 Unilateral primary osteoarthritis, right knee: Secondary | ICD-10-CM | POA: Diagnosis not present

## 2023-07-23 DIAGNOSIS — M7062 Trochanteric bursitis, left hip: Secondary | ICD-10-CM | POA: Diagnosis not present

## 2023-07-23 DIAGNOSIS — M25661 Stiffness of right knee, not elsewhere classified: Secondary | ICD-10-CM | POA: Diagnosis not present

## 2023-07-26 ENCOUNTER — Other Ambulatory Visit: Payer: Self-pay

## 2023-07-26 DIAGNOSIS — I6522 Occlusion and stenosis of left carotid artery: Secondary | ICD-10-CM

## 2023-07-30 ENCOUNTER — Other Ambulatory Visit: Payer: Self-pay

## 2023-07-30 DIAGNOSIS — I483 Typical atrial flutter: Secondary | ICD-10-CM

## 2023-07-30 MED ORDER — APIXABAN 2.5 MG PO TABS
2.5000 mg | ORAL_TABLET | Freq: Two times a day (BID) | ORAL | 3 refills | Status: DC
Start: 2023-07-30 — End: 2023-10-18

## 2023-07-30 NOTE — Telephone Encounter (Signed)
Prescription refill request for Eliquis received. Indication:afib Last office visit:9/24 Scr:1.10  12/23 Age: 89 Weight:72.6  kg  Prescription refilled

## 2023-07-31 DIAGNOSIS — M7062 Trochanteric bursitis, left hip: Secondary | ICD-10-CM | POA: Diagnosis not present

## 2023-07-31 DIAGNOSIS — M25661 Stiffness of right knee, not elsewhere classified: Secondary | ICD-10-CM | POA: Diagnosis not present

## 2023-07-31 DIAGNOSIS — M1711 Unilateral primary osteoarthritis, right knee: Secondary | ICD-10-CM | POA: Diagnosis not present

## 2023-08-05 ENCOUNTER — Ambulatory Visit (HOSPITAL_COMMUNITY)
Admission: RE | Admit: 2023-08-05 | Discharge: 2023-08-05 | Disposition: A | Discharge status: Home / Self Care | Payer: Medicare Other | Source: Ambulatory Visit | Attending: Physician Assistant | Admitting: Physician Assistant

## 2023-08-05 DIAGNOSIS — I6522 Occlusion and stenosis of left carotid artery: Secondary | ICD-10-CM

## 2023-08-07 ENCOUNTER — Ambulatory Visit (HOSPITAL_COMMUNITY): Payer: Medicare Other

## 2023-08-07 ENCOUNTER — Ambulatory Visit: Payer: Medicare Other | Admitting: Physician Assistant

## 2023-08-07 VITALS — BP 153/81 | HR 65 | Temp 98.1°F | Ht 66.5 in | Wt 161.4 lb

## 2023-08-07 DIAGNOSIS — I6522 Occlusion and stenosis of left carotid artery: Secondary | ICD-10-CM

## 2023-08-07 DIAGNOSIS — M25661 Stiffness of right knee, not elsewhere classified: Secondary | ICD-10-CM | POA: Diagnosis not present

## 2023-08-07 DIAGNOSIS — M7062 Trochanteric bursitis, left hip: Secondary | ICD-10-CM | POA: Diagnosis not present

## 2023-08-07 DIAGNOSIS — M7989 Other specified soft tissue disorders: Secondary | ICD-10-CM

## 2023-08-07 DIAGNOSIS — R600 Localized edema: Secondary | ICD-10-CM | POA: Diagnosis not present

## 2023-08-07 DIAGNOSIS — M1711 Unilateral primary osteoarthritis, right knee: Secondary | ICD-10-CM | POA: Diagnosis not present

## 2023-08-07 NOTE — Progress Notes (Signed)
HISTORY AND PHYSICAL     CC:  follow up. Requesting Provider:  Lorenda Ishihara,*  HPI: This is a 89 y.o. male here for follow up for carotid artery stenosis.  Pt is s/p left TCAR for asymptomatic carotid artery stenosis on 08/01/2020 by Dr. Darrick Penna.    Pt was last seen 08/02/2022 and at that time the pt did not have any neurological sx.   Pt returns today for follow up.    Pt denies any amaurosis fugax, speech difficulties, weakness, numbness, paralysis or clumsiness or facial droop.    He continues to take Eliquis daily for atrial flutter.  He is also interested in being fitted for a new thigh-high compression stockings.  He has known venous insufficiency of bilateral lower extremities.  He also has a history of DVT in his left lower extremity.  The pt is on a statin for cholesterol management.  The pt is not on a daily aspirin.   Other AC:  Eliquis for hx of DVT The pt is on CCB, hydralazine, ARB for hypertension.   The pt is not on medication for diabetes Tobacco hx:  never  Pt does not have family hx of AAA.  Past Medical History:  Diagnosis Date   Coronary artery disease    60-70% circumflex (cath in [place] 08/2010).   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 Holter monitoring 2011 - AV node reentry, multifocal atrial tachycardia, and resting bradycardia. b. previously tx with [facility] in [place].    Past Surgical History:  Procedure Laterality Date   ABLATION OF DYSRHYTHMIC FOCUS  10/13/2014   SVT         by Dr Laqueta Due FISSURE REPAIR     CARDIOVERSION N/A 07/01/2020   Procedure: CARDIOVERSION;  Surgeon: Yates Decamp, MD;  Location[facility]  ENDOSCOPY;  Service: Cardiovascular;  Laterality: N/A;   CARDIOVERSION N/A 09/04/2022   Procedure: CARDIOVERSION;  Surgeon: Yates Decamp, MD;  Location: [facility] ENDOSCOPY;  Service: Cardiovascular;  Laterality: N/A;   ELECTROPHYSIOLOGY STUDY N/A 10/13/2014   Procedure: ELECTROPHYSIOLOGY STUDY;  Surgeon: Marinus Maw, MD;  Location: [facility] CATH LAB;  Service: Cardiovascular;  Laterality: N/A;   EYE SURGERY Bilateral 2011   cataract    PROSTATECTOMY     SUPRAVENTRICULAR TACHYCARDIA ABLATION N/A 10/13/2014   Procedure: SUPRAVENTRICULAR TACHYCARDIA ABLATION;  Surgeon: Marinus Maw, MD;  Location: [facility] CATH LAB;  Service: Cardiovascular;  Laterality: N/A;   TRANSCAROTID ARTERY REVASCULARIZATION (TCAR) Left 08/01/2020   TRANSCAROTID ARTERY REVASCULARIZATION  Left 08/01/2020   Procedure: LEFT TRANSCAROTID ARTERY REVASCULARIZATION;  Surgeon: Sherren Kerns, MD;  Location: [facility] OR;  Service: Vascular;  Laterality: Left;   ULTRASOUND GUIDANCE FOR VASCULAR ACCESS Right 08/01/2020   Procedure: ULTRASOUND GUIDANCE FOR VASCULAR ACCESS;  Surgeon: Sherren Kerns, MD;  Location: [facility] OR;  Service: Vascular;  Laterality: Right;    Allergies  Allergen Reactions   Amoxicillin Diarrhea    GI upset: Bleeding diarrhea    Ibuprofen Diarrhea    Bloody diarrhea    Penicillins Diarrhea    Bloody diarrhea    Ace Inhibitors Swelling    Face  swelling    Augmentin [Amoxicillin-Pot Clavulanate] Diarrhea   Doxycycline Hyclate Diarrhea   Spironolactone     Hyponatremia    Atorvastatin     Weakness    Azithromycin     Stomach ache    Diltiazem Nausea Only    Upset stomach per patient   Hydrochlorothiazide Other (See Comments)    Photosensitivity    Current Outpatient Medications  Medication Sig Dispense Refill   acebutolol (SECTRAL) 200 MG capsule Take 1 capsule by mouth twice daily 180 capsule 3   amLODipine (NORVASC) 5 MG tablet Take 1 tablet (5 mg total) by mouth daily. 90 tablet 3   apixaban (ELIQUIS) 2.5  MG TABS tablet Take 1 tablet (2.5 mg total) by mouth 2 (two) times daily. 180 tablet 3   b complex vitamins tablet Take 1 tablet by mouth daily.      Cholecalciferol (VITAMIN D) 125 MCG (5000 UT) CAPS Take 5,000 Units by mouth daily.      CHROMIUM PO Take 1 tablet by mouth daily.     Coenzyme Q10 (CO Q-10) 200 MG CAPS Take 200 mg by mouth daily.      Cyanocobalamin (B-12) 5000 MCG CAPS Take 5,000 mcg by mouth daily.     folic acid (FOLVITE) 800 MCG tablet Take 800 mcg by mouth daily.     hydrALAZINE (APRESOLINE) 100 MG tablet Take 1 (100 mg) tablet by mouth every morning and evening. Take 1 (50 mg) tablet daily in the afternoon. 180 tablet 3   hydrALAZINE (APRESOLINE) 50 MG tablet Take 1 (100 mg) tablet by mouth every morning and evening. Take 1 (50 mg) tablet daily in the afternoon. 90 tablet 3   L-Arginine 500 MG CAPS Take 500 mg by mouth daily.     LUTEIN PO Take 1 tablet by mouth daily.      Magnesium 200 MG TABS Take 400 mg by mouth daily.     melatonin 5 MG TABS Take 5 mg by mouth at bedtime as needed (sleep).     Menaquinone-7 (K2 PO) Take by mouth daily at 6 (six) AM.     Probiotic Product (ALIGN PO) Take 1 capsule by mouth daily.      rosuvastatin (CRESTOR) 10 MG tablet Take 1 tablet (10 mg total) by mouth daily. 90 tablet 3   selenium 50 MCG TABS tablet Take 50 mcg by mouth daily.     Simethicone 125 MG CAPS Take 125 mg by mouth daily.     valsartan (DIOVAN) 160 MG tablet Take 1 tablet (160 mg total) by mouth daily. 90 tablet 3   Vitamin A 2400 MCG (8000 UT) CAPS Take 8,000 Units by mouth daily.     vitamin C (ASCORBIC ACID) 500 MG tablet Take 500 mg by mouth daily.     zinc gluconate 50 MG tablet Take 50 mg by mouth daily.     No current facility-administered medications for this visit.    Family History  Problem Relation Age of Onset   Hypertension Mother    Heart attack Father 66       only HA   Heart disease Father    Myocarditis Father    Hypertension Sister    Ulcers  Sister    Hypertension Sister    Hypertension Brother    Diabetes Neg Hx    Coronary artery disease Neg Hx     Social History   Socioeconomic History   Marital status: Married    Spouse name: Not on  file   Number of children: 4   Years of education: Not on file   Highest education level: Not on file  Occupational History   Occupation: Retired  Tobacco Use   Smoking status: Never   Smokeless tobacco: Never  Vaping Use   Vaping status: Never Used  Substance and Sexual Activity   Alcohol use: No   Drug use: No   Sexual activity: Not Currently  Other Topics Concern   Not on file  Social History Narrative   Regular exercise: Yes   Social Determinants of Health   Financial Resource Strain: Low Risk  (09/13/2022)   Overall Financial Resource Strain (CARDIA)    Difficulty of Paying Living Expenses: Not hard at all  Food Insecurity: No Food Insecurity (09/13/2022)   Hunger Vital Sign    Worried About Running Out of Food in the Last Year: Never true    Ran Out of Food in the Last Year: Never true  Transportation Needs: No Transportation Needs (09/13/2022)   PRAPARE - Administrator, Civil Service (Medical): No    Lack of Transportation (Non-Medical): No  Physical Activity: Inactive (10/24/2022)   Exercise Vital Sign    Days of Exercise per Week: 0 days    Minutes of Exercise per Session: 0 min  Stress: Not on file  Social Connections: Not on file  Intimate Partner Violence: Not on file     REVIEW OF SYSTEMS:   [X]  denotes positive finding, [ ]  denotes negative finding Cardiac  Comments:  Chest pain or chest pressure:    Shortness of breath upon exertion:    Short of breath when lying flat:    Irregular heart rhythm:        Vascular    Pain in calf, thigh, or hip brought on by ambulation:    Pain in feet at night that wakes you up from your sleep:     Blood clot in your veins:    Leg swelling:         Pulmonary    Oxygen at home:    Productive  cough:     Wheezing:         Neurologic    Sudden weakness in arms or legs:     Sudden numbness in arms or legs:     Sudden onset of difficulty speaking or slurred speech:    Temporary loss of vision in one eye:     Problems with dizziness:         Gastrointestinal    Blood in stool:     Vomited blood:         Genitourinary    Burning when urinating:     Blood in urine:        Psychiatric    Major depression:         Hematologic    Bleeding problems:    Problems with blood clotting too easily:        Skin    Rashes or ulcers:        Constitutional    Fever or chills:      PHYSICAL EXAMINATION:  Today's Vitals   08/07/23 1507  BP: (!) 153/81  Pulse: 65  Temp: 98.1 F (36.7 C)  TempSrc: Temporal  SpO2: 95%  Weight: 161 lb 6.4 oz (73.2 kg)  Height: 5' 6.5" (1.689 m)   Body mass index is 25.66 kg/m.   General:  WDWN in NAD; vital signs documented above Gait: Not observed  HENT: WNL, normocephalic Pulmonary: normal non-labored breathing Cardiac: regular HR Abdomen: soft, NT Skin: without rashes Vascular Exam/Pulses:  Right Left  Radial 2+ (normal) 2+ (normal)   Extremities: without open wounds Musculoskeletal: no muscle wasting or atrophy  Neurologic: A&O X 3; moving all extremities equally; speech is fluent/normal Psychiatric:  The pt has Normal affect.   Non-Invasive Vascular Imaging:   Carotid Duplex on 08/06/2023 Right:  1-39% ICA stenosis Left:  1-39% ICA stenosis Patent left carotid stent  Previous Carotid duplex on 08/02/2022: Right: 1-39% ICA stenosis Left:   patent stent    ASSESSMENT/PLAN:: 89 y.o. male here for follow up carotid artery stenosis and BLE edema  -duplex today reveals 1 to 39% stenosis of right ICA and widely patent TCAR stent of the left ICA -discussed s/s of stroke with pt and he understands should he develop any of these sx, he will go to the nearest ER or call 911. -pt will f/u in 1 year with carotid duplex -He  was measured for and prescribed a new thigh-high compression stockings to be worn on a regular basis of bilateral lower extremities -pt will call sooner should he have any issues. -continue statin.  Pt is on Eliquis   Emilie Rutter, [facility] [phone]  Clinic MD:  Randie Heinz

## 2023-08-12 ENCOUNTER — Other Ambulatory Visit: Payer: Self-pay

## 2023-08-12 DIAGNOSIS — I6522 Occlusion and stenosis of left carotid artery: Secondary | ICD-10-CM

## 2023-08-27 DIAGNOSIS — I6523 Occlusion and stenosis of bilateral carotid arteries: Secondary | ICD-10-CM | POA: Diagnosis not present

## 2023-08-27 DIAGNOSIS — I4892 Unspecified atrial flutter: Secondary | ICD-10-CM | POA: Diagnosis not present

## 2023-08-27 DIAGNOSIS — Z23 Encounter for immunization: Secondary | ICD-10-CM | POA: Diagnosis not present

## 2023-08-27 DIAGNOSIS — I10 Essential (primary) hypertension: Secondary | ICD-10-CM | POA: Diagnosis not present

## 2023-08-27 DIAGNOSIS — K219 Gastro-esophageal reflux disease without esophagitis: Secondary | ICD-10-CM | POA: Diagnosis not present

## 2023-08-27 DIAGNOSIS — B372 Candidiasis of skin and nail: Secondary | ICD-10-CM | POA: Diagnosis not present

## 2023-08-27 DIAGNOSIS — E782 Mixed hyperlipidemia: Secondary | ICD-10-CM | POA: Diagnosis not present

## 2023-08-27 DIAGNOSIS — I739 Peripheral vascular disease, unspecified: Secondary | ICD-10-CM | POA: Diagnosis not present

## 2023-08-27 DIAGNOSIS — I771 Stricture of artery: Secondary | ICD-10-CM | POA: Diagnosis not present

## 2023-09-25 ENCOUNTER — Encounter (HOSPITAL_BASED_OUTPATIENT_CLINIC_OR_DEPARTMENT_OTHER): Payer: Self-pay | Admitting: Pulmonary Disease

## 2023-09-25 ENCOUNTER — Ambulatory Visit (HOSPITAL_BASED_OUTPATIENT_CLINIC_OR_DEPARTMENT_OTHER): Payer: Medicare Other | Admitting: Pulmonary Disease

## 2023-09-25 ENCOUNTER — Ambulatory Visit (HOSPITAL_BASED_OUTPATIENT_CLINIC_OR_DEPARTMENT_OTHER): Payer: Medicare Other

## 2023-09-25 VITALS — BP 122/80 | HR 63 | Ht 66.5 in | Wt 158.0 lb

## 2023-09-25 DIAGNOSIS — Z03822 Encounter for observation for suspected aspirated (inhaled) foreign body ruled out: Secondary | ICD-10-CM | POA: Diagnosis not present

## 2023-09-25 DIAGNOSIS — J209 Acute bronchitis, unspecified: Secondary | ICD-10-CM | POA: Diagnosis not present

## 2023-09-25 DIAGNOSIS — I700 Atherosclerosis of aorta: Secondary | ICD-10-CM | POA: Diagnosis not present

## 2023-09-25 DIAGNOSIS — G4733 Obstructive sleep apnea (adult) (pediatric): Secondary | ICD-10-CM

## 2023-09-25 DIAGNOSIS — R059 Cough, unspecified: Secondary | ICD-10-CM | POA: Diagnosis not present

## 2023-09-25 MED ORDER — GUAIFENESIN-CODEINE 200-8 MG/5ML PO LIQD: 5.0000 mL | Freq: Every day | ORAL | 0 refills | Status: DC

## 2023-09-25 MED ORDER — PREDNISONE 10 MG PO TABS: 10.0000 mg | ORAL_TABLET | Freq: Every day | ORAL | 0 refills | Status: DC

## 2023-09-25 NOTE — Patient Instructions (Addendum)
 X CXR today  X start taking clindamycin as directed  Okay to use Delsym OTC 5 mL twice daily for cough.  xWill call in prednisone and codeine cough syrup which she can fill and if you are no better by tomorrow

## 2023-09-25 NOTE — Progress Notes (Signed)
   Subjective:    Patient ID: Dalton Cardenas, male    DOB: 05-25-1934, 89 y.o.   MRN: [id number]  HPI  89 yo never smoker follow-up of moderate OSA.   PMH- atrial fibrillation on Eliquis  status post ablation 2016, DCCV 2023  GERD Left carotid stent, subclavian stenosis 06/2020 Hyponatremia, AKI - related to diuretic and contrast   11/2019 episode of hemoptysis   10/26/20 Developed LT knee hemarthrosis followed by LLE DVT   Chief Complaint  Patient presents with   Cough    Present for 2-5 days, had some teeth removed the beginning of the month   Last seen 2 years ago. He had bilateral wisdom teeth extraction on 1/2 and thereafter developed a cough.  Eliquis  was stopped 2 days prior to procedure and resume 1 day after, bleeding was minimal.  He reports yellow sputum with blood-tinge No sick contacts, no fevers, no bodyaches He complains of generalized weakness. There is a trip planned to [place] in 2 weeks, accompanied by his wife who corroborates history  He has not resumed CPAP yet. Problems with mask or pressure CPAP download shows excellent control of events on auto settings 8 to 12 cm with average pressure of 10 and maximum 11 cm     Significant tests/ events reviewed   PSG 10/2008 >> AHI 16, predom supine corrected by CPAP 10 cm Later on, adjusted by autoCPAP to 12 cm  10/2020  Duplex showed age indeterminate DVT LT femoral vein & LT small saphenous  Review of Systems neg for any significant sore throat, dysphagia, itching, sneezing, nasal congestion or excess/ purulent secretions, fever, chills, sweats, unintended wt loss, pleuritic or exertional cp, hempoptysis, orthopnea pnd or change in chronic leg swelling. Also denies presyncope, palpitations, heartburn, abdominal pain, nausea, vomiting, diarrhea or change in bowel or urinary habits, dysuria,hematuria, rash, arthralgias, visual complaints, headache, numbness weakness or ataxia.     Objective:   Physical Exam  Gen.  Pleasant, elderly well-nourished, in no distress ENT - no thrush, no pallor/icterus,no post nasal drip Neck: No JVD, no thyromegaly, no carotid bruits Lungs: no use of accessory muscles, no dullness to percussion, occasional scattered rhonchi  Cardiovascular: Rhythm regular, heart sounds  normal, no murmurs or gallops, no peripheral edema Musculoskeletal: No deformities, no cyanosis or clubbing        Assessment & Plan:    Possibly viral bronchitis, rule out aspiration episode post dental extraction -Will obtain chest x-ray today -Asked him to start clindamycin which she had been prescribed already -side effects to multiple other antibiotics noted -If not improved with above, will give him prescription cough syrup and given low-dose prednisone  since he has a trip planned to [place] in 2 weeks  OSA on CPAP -very compliant, CPAP is only helped improve his daytime somnolence and fatigue. Confirmed by download. CPAP supplies will be renewed for a year

## 2023-09-26 ENCOUNTER — Other Ambulatory Visit (HOSPITAL_BASED_OUTPATIENT_CLINIC_OR_DEPARTMENT_OTHER): Payer: Self-pay | Admitting: Pulmonary Disease

## 2023-10-17 ENCOUNTER — Ambulatory Visit: Payer: Self-pay | Admitting: Cardiology

## 2023-10-18 ENCOUNTER — Other Ambulatory Visit (HOSPITAL_BASED_OUTPATIENT_CLINIC_OR_DEPARTMENT_OTHER): Payer: Self-pay | Admitting: Pulmonary Disease

## 2023-10-18 ENCOUNTER — Other Ambulatory Visit: Payer: Self-pay | Admitting: Cardiology

## 2023-10-18 DIAGNOSIS — I483 Typical atrial flutter: Secondary | ICD-10-CM

## 2023-10-18 DIAGNOSIS — I10 Essential (primary) hypertension: Secondary | ICD-10-CM

## 2023-10-18 DIAGNOSIS — E7800 Pure hypercholesterolemia, unspecified: Secondary | ICD-10-CM

## 2023-10-18 MED ORDER — AMLODIPINE BESYLATE 5 MG PO TABS: 5.0000 mg | ORAL_TABLET | Freq: Every day | ORAL | 3 refills | Status: DC

## 2023-10-18 MED ORDER — APIXABAN 2.5 MG PO TABS: 2.5000 mg | ORAL_TABLET | Freq: Two times a day (BID) | ORAL | 3 refills | Status: DC

## 2023-10-18 MED ORDER — ACEBUTOLOL HCL 200 MG PO CAPS: 200.0000 mg | ORAL_CAPSULE | Freq: Two times a day (BID) | ORAL | 3 refills | Status: DC

## 2023-10-18 MED ORDER — ROSUVASTATIN CALCIUM 10 MG PO TABS: 10.0000 mg | ORAL_TABLET | Freq: Every day | ORAL | 3 refills | Status: DC

## 2023-10-18 MED ORDER — HYDRALAZINE HCL 100 MG PO TABS: ORAL_TABLET | ORAL | 3 refills | Status: DC

## 2023-10-18 NOTE — Progress Notes (Signed)
ICD-10-CM   1. Pure hypercholesterolemia  E78.00 rosuvastatin (CRESTOR) 10 MG tablet    2. Essential hypertension, benign  I10 acebutolol (SECTRAL) 200 MG capsule    amLODipine (NORVASC) 5 MG tablet    hydrALAZINE (APRESOLINE) 100 MG tablet    3. Typical atrial flutter (HCC)  I48.3 apixaban (ELIQUIS) 2.5 MG TABS tablet     Meds ordered this encounter  Medications   acebutolol (SECTRAL) 200 MG capsule    Sig: Take 1 capsule (200 mg total) by mouth 2 (two) times daily.    Dispense:  180 capsule    Refill:  3   amLODipine (NORVASC) 5 MG tablet    Sig: Take 1 tablet (5 mg total) by mouth daily.    Dispense:  90 tablet    Refill:  3   apixaban (ELIQUIS) 2.5 MG TABS tablet    Sig: Take 1 tablet (2.5 mg total) by mouth 2 (two) times daily.    Dispense:  180 tablet    Refill:  3   hydrALAZINE (APRESOLINE) 100 MG tablet    Sig: Take 1 (100 mg) tablet by mouth every morning and evening. Take 1 (50 mg) tablet daily in the afternoon.    Dispense:  180 tablet    Refill:  3    Pt requesting to have both hydralazine 100 mg and 50 mg tablets in order to keep up with his current dosing schedule.   rosuvastatin (CRESTOR) 10 MG tablet    Sig: Take 1 tablet (10 mg total) by mouth daily.    Dispense:  90 tablet    Refill:  3

## 2023-11-08 ENCOUNTER — Ambulatory Visit: Payer: Medicare Other | Attending: Cardiology | Admitting: Cardiology

## 2023-11-11 ENCOUNTER — Encounter: Payer: Self-pay | Admitting: Cardiology

## 2023-12-10 DIAGNOSIS — R7303 Prediabetes: Secondary | ICD-10-CM | POA: Diagnosis not present

## 2023-12-10 DIAGNOSIS — I82402 Acute embolism and thrombosis of unspecified deep veins of left lower extremity: Secondary | ICD-10-CM | POA: Diagnosis not present

## 2023-12-10 DIAGNOSIS — Z0000 Encounter for general adult medical examination without abnormal findings: Secondary | ICD-10-CM | POA: Diagnosis not present

## 2023-12-10 DIAGNOSIS — I10 Essential (primary) hypertension: Secondary | ICD-10-CM | POA: Diagnosis not present

## 2023-12-10 DIAGNOSIS — E782 Mixed hyperlipidemia: Secondary | ICD-10-CM | POA: Diagnosis not present

## 2023-12-10 DIAGNOSIS — I4892 Unspecified atrial flutter: Secondary | ICD-10-CM | POA: Diagnosis not present

## 2023-12-10 DIAGNOSIS — I441 Atrioventricular block, second degree: Secondary | ICD-10-CM | POA: Diagnosis not present

## 2023-12-10 DIAGNOSIS — R0781 Pleurodynia: Secondary | ICD-10-CM | POA: Diagnosis not present

## 2023-12-10 DIAGNOSIS — D6869 Other thrombophilia: Secondary | ICD-10-CM | POA: Diagnosis not present

## 2023-12-10 DIAGNOSIS — D472 Monoclonal gammopathy: Secondary | ICD-10-CM | POA: Diagnosis not present

## 2024-01-10 ENCOUNTER — Encounter (INDEPENDENT_AMBULATORY_CARE_PROVIDER_SITE_OTHER): Payer: Medicare Other | Admitting: Ophthalmology

## 2024-01-10 DIAGNOSIS — H43813 Vitreous degeneration, bilateral: Secondary | ICD-10-CM

## 2024-01-10 DIAGNOSIS — I10 Essential (primary) hypertension: Secondary | ICD-10-CM | POA: Diagnosis not present

## 2024-01-10 DIAGNOSIS — H33303 Unspecified retinal break, bilateral: Secondary | ICD-10-CM

## 2024-01-10 DIAGNOSIS — H35373 Puckering of macula, bilateral: Secondary | ICD-10-CM | POA: Diagnosis not present

## 2024-01-10 DIAGNOSIS — H35033 Hypertensive retinopathy, bilateral: Secondary | ICD-10-CM | POA: Diagnosis not present

## 2024-01-16 ENCOUNTER — Other Ambulatory Visit (HOSPITAL_COMMUNITY): Payer: Self-pay | Admitting: Internal Medicine

## 2024-01-16 DIAGNOSIS — M79605 Pain in left leg: Secondary | ICD-10-CM | POA: Diagnosis not present

## 2024-01-16 DIAGNOSIS — Z86718 Personal history of other venous thrombosis and embolism: Secondary | ICD-10-CM | POA: Diagnosis not present

## 2024-01-16 DIAGNOSIS — M7989 Other specified soft tissue disorders: Secondary | ICD-10-CM

## 2024-01-17 ENCOUNTER — Ambulatory Visit (HOSPITAL_COMMUNITY)
Admission: RE | Admit: 2024-01-17 | Discharge: 2024-01-17 | Disposition: A | Discharge status: Home / Self Care | Source: Ambulatory Visit | Attending: Vascular Surgery | Admitting: Vascular Surgery

## 2024-01-17 DIAGNOSIS — M7989 Other specified soft tissue disorders: Secondary | ICD-10-CM | POA: Insufficient documentation

## 2024-01-17 DIAGNOSIS — M79662 Pain in left lower leg: Secondary | ICD-10-CM | POA: Diagnosis not present

## 2024-01-28 ENCOUNTER — Encounter: Payer: Self-pay | Admitting: Cardiology

## 2024-01-28 ENCOUNTER — Ambulatory Visit: Attending: Cardiology | Admitting: Cardiology

## 2024-01-28 VITALS — BP 130/72 | Resp 16 | Ht 66.0 in | Wt 158.8 lb

## 2024-01-28 DIAGNOSIS — I10 Essential (primary) hypertension: Secondary | ICD-10-CM

## 2024-01-28 DIAGNOSIS — I480 Paroxysmal atrial fibrillation: Secondary | ICD-10-CM | POA: Diagnosis not present

## 2024-01-28 DIAGNOSIS — I5032 Chronic diastolic (congestive) heart failure: Secondary | ICD-10-CM | POA: Diagnosis not present

## 2024-01-28 NOTE — Progress Notes (Signed)
 " Cardiology Office Note:  .   Date:  01/30/2024  ID:  Dalton Cardenas, DOB Feb 10, 1934, MRN [id number] PCP: Elliot Charm, MD  [facility] Providers Cardiologist:  Gordy Bergamo, MD Electrophysiologist:  OLE ONEIDA HOLTS, MD   History of Present Illness: .   Dalton Cardenas is a 89 y.o. 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  He now presents for a 90-month office visit.  His last episode of atrial fibrillation and acute diastolic heart failure needing cardioversion was on 09/04/2022.  Discussed the use of AI scribe software for clinical note transcription with the patient, who gave verbal consent to proceed.  History of Present Illness About a week ago he had complained about left leg pain and swelling had DVT scan which was negative.  He is presently on anticoagulation. He experiences constant leg pain with every step, including at night. Tylenol  was ineffective, but the pain has improved slightly. Compression stockings have significantly reduced leg swelling. There are no cord-like sensations in the leg and no recent episodes of blood in the joint since last month.  He denies recent episodes of atrial fibrillation. Blood pressure was elevated to 187/22 during this visit, attributed to anxiety, but later measured at 128/72 after relaxing. He has a history of first-degree heart block with no changes in heart rhythm or new symptoms.   Labs    External Labs:  NA  ROS  Review  of Systems  Cardiovascular:  Positive for leg swelling (chronic and stable). Negative for chest pain and dyspnea on exertion.    Physical Exam:   VS:  BP 130/72 (BP Location: Left Arm, Patient Position: Sitting, Cuff Size: Normal)   Resp 16   Ht 5' 6 (1.676 m)   Wt 158 lb 12.8 oz (72 kg)   SpO2 97%   BMI 25.63 kg/m    Wt Readings from Last 3 Encounters:  01/28/24 158 lb 12.8 oz (72 kg)  09/25/23 158 lb (71.7 kg)  08/07/23 161 lb 6.4 oz (73.2 kg)    Physical Exam Neck:     Vascular: No JVD.  Cardiovascular:     Rate and Rhythm: Normal rate and regular rhythm.     Pulses: Intact distal pulses.     Heart sounds: S1 normal and S2 normal. Murmur heard.     Early systolic murmur is present with a grade of 3/6 at the upper right sternal border.     No gallop.  Pulmonary:     Effort: Pulmonary effort is normal.     Breath sounds: Normal breath sounds.  Abdominal:     General: Bowel sounds are normal.     Palpations: Abdomen is soft.  Musculoskeletal:     Right lower leg: Edema (trace) present.     Left lower leg: Edema (1-2+ pitting below knee) present.    Studies Reviewed: SABRA    EKG:    EKG Interpretation Date/Time:  Tuesday Jan 28 2024 16:04:48 EDT Ventricular Rate:  55 PR Interval:  298 QRS Duration:  106 QT  Interval:  464 QTC Calculation: 443 R Axis:   -80  Text Interpretation: EKG 01/28/2024: Sinus rhythm with first-degree AV block at rate of 55 bpm, left anterior fascicular block.  Poor R progression, cannot exclude anteroseptal infarct old.  No evidence of ischemia, normal QT interval.  Compared to 09/04/2022, PAC (1) no longer present. Confirmed by Gillis Boardley, Jagadeesh ([id number]) on 01/28/2024 4:45:51 PM    Medications and allergies    Allergies  Allergen Reactions   Amoxicillin Diarrhea    GI upset: Bleeding diarrhea    Ibuprofen Diarrhea    Bloody diarrhea    Penicillins Diarrhea    Bloody diarrhea    Ace Inhibitors Swelling    Face swelling    Augmentin  [Amoxicillin-Pot Clavulanate] Diarrhea   Doxycycline Hyclate Diarrhea   Spironolactone      Hyponatremia    Atorvastatin     Weakness    Azithromycin     Stomach ache    Diltiazem Nausea Only    Upset stomach per patient   Hydrochlorothiazide  Other (See Comments)    Photosensitivity     Current Outpatient Medications:    acebutolol  (SECTRAL ) 200 MG capsule, Take 1 capsule (200 mg total) by mouth 2 (two) times daily., Disp: 180 capsule, Rfl: 3   amLODipine  (NORVASC ) 5 MG tablet, Take 1 tablet (5 mg total) by mouth daily., Disp: 90 tablet, Rfl: 3   apixaban  (ELIQUIS ) 2.5 MG TABS tablet, Take 1 tablet (2.5 mg total) by mouth 2 (two) times daily., Disp: 180 tablet, Rfl: 3   b complex vitamins tablet, Take 1 tablet by mouth daily. , Disp: , Rfl:    Cholecalciferol (VITAMIN D) 125 MCG (5000 UT) CAPS, Take 5,000 Units by mouth daily. , Disp: , Rfl:    Coenzyme Q10 (CO Q-10) 200 MG CAPS, Take 200 mg by mouth daily. , Disp: , Rfl:    Cyanocobalamin (B-12) 5000 MCG CAPS, Take 5,000 mcg by mouth daily., Disp: , Rfl:    hydrALAZINE  (APRESOLINE ) 100 MG tablet, Take 1 (100 mg) tablet by mouth every morning and evening. Take 1 (50 mg) tablet daily in the afternoon., Disp: 180 tablet, Rfl: 3   hydrALAZINE  (APRESOLINE ) 50 MG tablet, Take 1 (100 mg) tablet by mouth every morning and evening. Take 1 (50 mg) tablet daily in the afternoon., Disp: 90 tablet, Rfl: 3   LUTEIN PO, Take 1 tablet by mouth daily. , Disp: , Rfl:    Magnesium  200 MG TABS, Take 400 mg by mouth daily., Disp: , Rfl:    melatonin 5 MG TABS, Take 5 mg by mouth at bedtime as needed (sleep)., Disp: , Rfl:    Menaquinone-7 (K2 PO), Take by mouth daily at 6 (six) AM., Disp: , Rfl:    rosuvastatin  (CRESTOR ) 10 MG tablet, Take 1 tablet (10 mg total) by mouth daily., Disp: 90 tablet, Rfl: 3   selenium 50 MCG TABS tablet, Take 50 mcg by mouth daily., Disp: , Rfl:    Simethicone 125 MG CAPS, Take 125 mg by mouth daily., Disp: , Rfl:     valsartan  (DIOVAN ) 160 MG tablet, Take 1 tablet by mouth once daily, Disp: 90 tablet, Rfl: 1   Vitamin A 2400 MCG (8000 UT) CAPS, Take 8,000 Units by mouth daily., Disp: , Rfl:    vitamin C (ASCORBIC ACID) 500 MG tablet, Take 500 mg by mouth daily., Disp: , Rfl:    zinc gluconate 50 MG tablet, Take 50 mg by mouth daily., Disp: , Rfl:  No orders of the defined types were placed in this encounter.   ASSESSMENT AND PLAN: .      ICD-10-CM   1. Chronic diastolic (congestive) heart failure (HCC)  I50.32     2. Paroxysmal atrial fibrillation (HCC)  I48.0 EKG 12-Lead    3. Primary hypertension  I10       Assessment and Plan Assessment & Plan Paroxysmal atrial fibrillation and first degree atrioventricular block   The first degree atrioventricular block presents with a regular rhythm and shows no progression or additional heart block. There are no new symptoms or changes in cardiac status.  He is maintaining sinus rhythm, continue long-term anticoagulation in view of high risk for cardioembolic phenomena and high CHA2DS2-VASc rescore.  Chronic diastolic heart failure Since maintaining sinus rhythm, he has not had any heart failure symptoms.  He does have chronic leg edema left worse than the right and I suspect some of his leg edema is related to past DVT and also left knee arthritis.  No clinical evidence of heart failure.  Pain in leg   Intermittent leg pain, previously severe, has improved but persists slightly. There is no visible bruising or cord-like sensation. Differential diagnosis includes muscle spasm and deep vein thrombosis (DVT). DVT is unlikely as the recent scan showed no evidence of thrombosis, and there are no signs of bruising or significant swelling. Pain may be related to muscle spasm, considering knee issues. Consider muscle spasm as a potential cause.  Hypertension   Blood pressure was initially elevated at 187/72, likely due to anxiety, but improved to 132/72 after  relaxation. Blood pressure is well-controlled. Continue current hypertension management and monitor blood pressure regularly. He is presently on amlodipine  5 mg daily and hydralazine  100 mg twice daily and 50 mg in the afternoon and valsartan  160 mg daily.  Continue the same.  He is unable to tolerate increased dose of amlodipine  due to leg edema.  CBC and renal function are being closely monitored by his PCP, no bleeding diathesis, I will see him back in 6 months or sooner if problems.    Signed,  Gordy Bergamo, MD, [facility] 01/30/2024, 5:28 PM [facility] [address] [place], [place] [address] Phone: [phone]. Fax:  [phone]  "

## 2024-01-28 NOTE — Patient Instructions (Signed)

## 2024-03-16 DIAGNOSIS — E782 Mixed hyperlipidemia: Secondary | ICD-10-CM | POA: Diagnosis not present

## 2024-03-16 DIAGNOSIS — E7800 Pure hypercholesterolemia, unspecified: Secondary | ICD-10-CM | POA: Diagnosis not present

## 2024-03-16 DIAGNOSIS — I10 Essential (primary) hypertension: Secondary | ICD-10-CM | POA: Diagnosis not present

## 2024-04-14 DIAGNOSIS — K5900 Constipation, unspecified: Secondary | ICD-10-CM | POA: Diagnosis not present

## 2024-04-14 DIAGNOSIS — R140 Abdominal distension (gaseous): Secondary | ICD-10-CM | POA: Diagnosis not present

## 2024-04-14 DIAGNOSIS — K219 Gastro-esophageal reflux disease without esophagitis: Secondary | ICD-10-CM | POA: Diagnosis not present

## 2024-04-14 DIAGNOSIS — Z860100 Personal history of colon polyps, unspecified: Secondary | ICD-10-CM | POA: Diagnosis not present

## 2024-04-17 ENCOUNTER — Encounter (HOSPITAL_COMMUNITY): Payer: Self-pay

## 2024-04-17 ENCOUNTER — Telehealth: Payer: Self-pay | Admitting: Cardiology

## 2024-04-17 ENCOUNTER — Emergency Department (HOSPITAL_COMMUNITY)

## 2024-04-17 ENCOUNTER — Other Ambulatory Visit: Payer: Self-pay

## 2024-04-17 ENCOUNTER — Emergency Department (HOSPITAL_COMMUNITY)
Admission: EM | Admit: 2024-04-17 | Discharge: 2024-04-17 | Disposition: A | Discharge status: Home / Self Care | Attending: Emergency Medicine | Admitting: Emergency Medicine

## 2024-04-17 DIAGNOSIS — I110 Hypertensive heart disease with heart failure: Secondary | ICD-10-CM | POA: Diagnosis not present

## 2024-04-17 DIAGNOSIS — E8770 Fluid overload, unspecified: Secondary | ICD-10-CM

## 2024-04-17 DIAGNOSIS — Z7982 Long term (current) use of aspirin: Secondary | ICD-10-CM | POA: Insufficient documentation

## 2024-04-17 DIAGNOSIS — R0789 Other chest pain: Secondary | ICD-10-CM

## 2024-04-17 DIAGNOSIS — I2510 Atherosclerotic heart disease of native coronary artery without angina pectoris: Secondary | ICD-10-CM | POA: Insufficient documentation

## 2024-04-17 DIAGNOSIS — M7989 Other specified soft tissue disorders: Secondary | ICD-10-CM | POA: Insufficient documentation

## 2024-04-17 DIAGNOSIS — R600 Localized edema: Secondary | ICD-10-CM | POA: Insufficient documentation

## 2024-04-17 DIAGNOSIS — E861 Hypovolemia: Secondary | ICD-10-CM | POA: Insufficient documentation

## 2024-04-17 DIAGNOSIS — Z7901 Long term (current) use of anticoagulants: Secondary | ICD-10-CM | POA: Insufficient documentation

## 2024-04-17 DIAGNOSIS — I509 Heart failure, unspecified: Secondary | ICD-10-CM | POA: Insufficient documentation

## 2024-04-17 DIAGNOSIS — Z79899 Other long term (current) drug therapy: Secondary | ICD-10-CM | POA: Diagnosis not present

## 2024-04-17 DIAGNOSIS — Z8546 Personal history of malignant neoplasm of prostate: Secondary | ICD-10-CM | POA: Insufficient documentation

## 2024-04-17 DIAGNOSIS — J90 Pleural effusion, not elsewhere classified: Secondary | ICD-10-CM | POA: Diagnosis not present

## 2024-04-17 DIAGNOSIS — R0602 Shortness of breath: Secondary | ICD-10-CM | POA: Diagnosis not present

## 2024-04-17 LAB — BASIC METABOLIC PANEL WITH GFR
Anion gap: 11 (ref 5–15)
BUN: 16 mg/dL (ref 8–23)
CO2: 22 mmol/L (ref 22–32)
Calcium: 8.9 mg/dL (ref 8.9–10.3)
Chloride: 104 mmol/L (ref 98–111)
Creatinine, Ser: 1.06 mg/dL (ref 0.61–1.24)
GFR, Estimated: >60 mL/min (ref >60)
Glucose, Bld: 134 mg/dL — ABNORMAL HIGH (ref 70–99)
Potassium: 4.2 mmol/L (ref 3.5–5.1)
Sodium: 137 mmol/L (ref 135–145)

## 2024-04-17 LAB — TROPONIN I (HIGH SENSITIVITY)
Troponin I (High Sensitivity): 9 ng/L (ref <18)
Troponin I (High Sensitivity): 12 ng/L

## 2024-04-17 LAB — HEPATIC FUNCTION PANEL
ALT: 17 U/L (ref 0–44)
AST: 30 U/L (ref 15–41)
Albumin: 3.6 g/dL (ref 3.5–5.0)
Alkaline Phosphatase: 65 U/L (ref 38–126)
Bilirubin, Direct: 0.2 mg/dL (ref 0.0–0.2)
Indirect Bilirubin: 1.1 mg/dL — ABNORMAL HIGH (ref 0.3–0.9)
Total Bilirubin: 1.3 mg/dL — ABNORMAL HIGH (ref 0.0–1.2)
Total Protein: 6.9 g/dL (ref 6.5–8.1)

## 2024-04-17 LAB — CBC
HCT: 37.9 % — ABNORMAL LOW (ref 39.0–52.0)
Hemoglobin: 12.1 g/dL — ABNORMAL LOW (ref 13.0–17.0)
MCH: 29.1 pg (ref 26.0–34.0)
MCHC: 31.9 g/dL (ref 30.0–36.0)
MCV: 91.1 fL (ref 80.0–100.0)
Platelets: 238 K/uL (ref 150–400)
RBC: 4.16 MIL/uL — ABNORMAL LOW (ref 4.22–5.81)
RDW: 13.2 % (ref 11.5–15.5)
WBC: 6.4 K/uL (ref 4.0–10.5)
nRBC: 0 % (ref 0.0–0.2)

## 2024-04-17 LAB — BRAIN NATRIURETIC PEPTIDE: B Natriuretic Peptide: 804.3 pg/mL — ABNORMAL HIGH (ref 0.0–100.0)

## 2024-04-17 LAB — LIPASE, BLOOD: Lipase: 33 U/L (ref 11–51)

## 2024-04-17 MED ORDER — FUROSEMIDE 20 MG PO TABS: 20.0000 mg | ORAL_TABLET | Freq: Every day | ORAL | 0 refills | Status: DC

## 2024-04-17 MED ORDER — FUROSEMIDE 20 MG PO TABS
40.0000 mg | ORAL_TABLET | Freq: Once | ORAL | Status: AC
  Administered 2024-04-17: 40 mg via ORAL
  Filled 2024-04-17: qty 2

## 2024-04-17 NOTE — Telephone Encounter (Signed)
 Pt would like a c/b in regards to his MyChart message he's also requesting a appt.

## 2024-04-17 NOTE — Telephone Encounter (Signed)
  Per MyChart scheduling message:  I am having very irregular heartbeats. Feel pressure in chest and above the throat. My legs above the knee and the feet are swollen. Feel short of breath after 2-300 steps. I do not feel well. Have had bloating in abdomen for sometime

## 2024-04-17 NOTE — ED Triage Notes (Signed)
 Pt came in via POV d/t central CP that radiates to his Lt jaw for the past couple of days, does endorse a Hx of A-Fib & exertional SOB as well. Also endorses Rt leg/foot hurting as well. A/Ox4, denies CP currently but rates neck pain 2/10 during triage.

## 2024-04-17 NOTE — ED Provider Notes (Signed)
 [place] EMERGENCY DEPARTMENT AT [facility] Provider Note   CSN: [id number] Arrival date & time: 04/17/24  1205    Patient presents with: Chest Pain and Shortness of Breath  Dalton Cardenas is a 89 y.o. male.  Had chest tightness and bloating earlier today. Feeling better now. Has chronic bilateral leg swelling. Denies difficulty breathing at rest, will feel SOB with walking 200 ft.    Chest Pain Associated symptoms: shortness of breath   Associated symptoms: no headache   Shortness of Breath Associated symptoms: no chest pain and no headaches     PMH Aflutter SVT CAD s/p internal carotid stent 2021 HTN Hx HF s/p cardioversion Hx prostate cancer s/p prostatectomy  Surg hx -Cardioversion 2023 -TCAR 2021 -Ablation 2016   Prior to Admission medications   Medication Sig Start Date End Date Taking? Authorizing Provider  acebutolol  (SECTRAL ) 200 MG capsule Take 1 capsule (200 mg total) by mouth 2 (two) times daily. 10/18/23  Yes Ladona Heinz, MD  acidophilus (RISAQUAD) CAPS capsule Take 1 capsule by mouth daily.   Yes [provider]  amLODipine  (NORVASC ) 5 MG tablet Take 1 tablet (5 mg total) by mouth daily. 10/18/23 10/12/24 Yes Ladona Heinz, MD  apixaban  (ELIQUIS ) 2.5 MG TABS tablet Take 1 tablet (2.5 mg total) by mouth 2 (two) times daily. 10/18/23  Yes Ladona Heinz, MD  aspirin  EC 81 MG tablet Take 81 mg by mouth at bedtime.   Yes [provider]  b complex vitamins tablet Take 1 tablet by mouth daily.    Yes [provider]  Berberine Chloride (BERBERINE HCI PO) Take 1 capsule by mouth daily.   Yes [provider]  Cholecalciferol (VITAMIN D) 125 MCG (5000 UT) CAPS Take 5,000 Units by mouth daily.    Yes [provider]  Coenzyme Q10 (CO Q-10) 200 MG CAPS Take 200 mg by mouth daily.    Yes [provider]  Cyanocobalamin (B-12) 1000 MCG CAPS Take 1,000 mcg by mouth daily.   Yes [provider]  furosemide   (LASIX ) 20 MG tablet Take 1 tablet (20 mg total) by mouth daily for 3 days. 04/17/24 04/20/24 Yes Diona Perkins, MD  hydrALAZINE  (APRESOLINE ) 100 MG tablet Take 1 (100 mg) tablet by mouth every morning and evening. Take 1 (50 mg) tablet daily in the afternoon. Patient taking differently: Take 50-100 mg by mouth See admin instructions. Take 1/2 tablet by mouth every morning and 1 tablet in the evening 10/18/23  Yes Ladona Heinz, MD  linaclotide [facility]) 72 MCG capsule Take 72 mcg by mouth daily before breakfast.   Yes [provider]  LUTEIN PO Take 1 tablet by mouth daily.    Yes [provider]  Magnesium  200 MG TABS Take 400 mg by mouth daily.   Yes [provider]  melatonin 5 MG TABS Take 5 mg by mouth at bedtime as needed (sleep).   Yes [provider]  Menaquinone-7 (K2 PO) Take by mouth daily at 6 (six) AM.   Yes [provider]  pantoprazole  (PROTONIX ) 40 MG tablet Take 40 mg by mouth daily. 04/14/24  Yes [provider]  rosuvastatin  (CRESTOR ) 10 MG tablet Take 1 tablet (10 mg total) by mouth daily. 10/18/23  Yes Ladona Heinz, MD  selenium 50 MCG TABS tablet Take 50 mcg by mouth daily.   Yes [provider]  Simethicone 125 MG CAPS Take 125 mg by mouth daily.   Yes [provider]  valsartan  (  DIOVAN ) 160 MG tablet Take 1 tablet by mouth once daily 10/21/23  Yes Ladona Heinz, MD  Vitamin A 2400 MCG (8000 UT) CAPS Take 8,000 Units by mouth daily.   Yes [provider]  vitamin C (ASCORBIC ACID) 500 MG tablet Take 500 mg by mouth daily.   Yes [provider]  zinc gluconate 50 MG tablet Take 50 mg by mouth daily.   Yes [provider]    Allergies: Amoxicillin, Ibuprofen, Penicillins, Ace inhibitors, Augmentin [amoxicillin-pot clavulanate], Doxycycline hyclate, Spironolactone , Atorvastatin, Azithromycin, Diltiazem, and Hydrochlorothiazide     Review of Systems  Respiratory:  Positive for chest tightness and  shortness of breath.   Cardiovascular:  Positive for leg swelling. Negative for chest pain.  Gastrointestinal:  Positive for abdominal distention.  Neurological:  Negative for headaches.    Updated Vital Signs BP (!) 141/86   Pulse 77   Temp 97.8 F (36.6 C) (Oral)   Resp 17   Ht 5' 7 (1.702 m)   Wt 71.7 kg   SpO2 98%   BMI 24.75 kg/m   Physical Exam Constitutional:      General: He is not in acute distress. Cardiovascular:     Rate and Rhythm: Bradycardia present. Rhythm irregular.     Comments: Extremities warm and well-perfused.  Pulmonary:     Effort: Pulmonary effort is normal.     Breath sounds: Normal breath sounds.  Abdominal:     General: Bowel sounds are normal.     Palpations: Abdomen is soft.     Tenderness: There is no abdominal tenderness.  Musculoskeletal:     Right lower leg: Edema present.     Left lower leg: Edema present.  Skin:    General: Skin is warm and dry.  Neurological:     Mental Status: He is alert.     (all labs ordered are listed, but only abnormal results are displayed) Labs Reviewed  BASIC METABOLIC PANEL WITH GFR - Abnormal; Notable for the following components:      Result Value   Glucose, Bld 134 (*)    All other components within normal limits  CBC - Abnormal; Notable for the following components:   RBC 4.16 (*)    Hemoglobin 12.1 (*)    HCT 37.9 (*)    All other components within normal limits  BRAIN NATRIURETIC PEPTIDE - Abnormal; Notable for the following components:   B Natriuretic Peptide 804.3 (*)    All other components within normal limits  HEPATIC FUNCTION PANEL - Abnormal; Notable for the following components:   Total Bilirubin 1.3 (*)    Indirect Bilirubin 1.1 (*)    All other components within normal limits  LIPASE, BLOOD  TROPONIN I (HIGH SENSITIVITY)  TROPONIN I (HIGH SENSITIVITY)  TROPONIN I (HIGH SENSITIVITY)    EKG: EKG Interpretation Date/Time:  Friday April 17 2024 12:34:17 EDT Ventricular  Rate:  58 PR Interval:    QRS Duration:  96 QT Interval:  448 QTC Calculation: 439 R Axis:   -71  Text Interpretation: Atrial fibrillation with slow ventricular response Left anterior fascicular block Septal infarct , age undetermined ST & T wave abnormality, consider lateral ischemia Abnormal ECG When compared with ECG of 28-Jan-2024 16:04, PREVIOUS ECG IS PRESENT Confirmed by Patt Alm DEL [phone]) on 04/17/2024 7:31:25 PM  Radiology: [facility] Chest 2 View Result Date: 04/17/2024 CLINICAL DATA:  Short of breath.  Chest tightness EXAM: CHEST - 2 VIEW COMPARISON:  Radiograph 09/25/2023 FINDINGS: Normal cardiac silhouette. Small bilateral  pleural effusions increased from prior. No focal consolidation. No pneumothorax. Lungs are hyperinflated. IMPRESSION: 1. Hyperinflated lungs. 2. Bilateral pleural effusions. Electronically Signed   By: Jackquline Boxer M.D.   On: 04/17/2024 14:31   Medications Ordered in the ED  furosemide  (LASIX ) tablet 40 mg (40 mg Oral Given 04/17/24 2328)                                  Medical Decision Making Amount and/or Complexity of Data Reviewed Labs: ordered.  Risk Prescription drug management.   Dalton Cardenas is a 89 y.o. male is here with chest tightness and bloating.   Additional history obtained:  Additional history obtained from wife. Previous records obtained and reviewed.  Lab Tests:  Ordered, reviewed, and interpreted labs, which included:  -Initial and repeat troponin unremarkable  -EKG with bradycardia, afib, and T wave inversions of lateral leads, which has been seen on prior EKG in May. -CBC with Hgb 12.1 (appears baseline) -BMP unremarkable   -BNP elevated 804.3  Imaging Studies ordered:  CXR: Hyperinflated lungs, bilateral pleural effusions.   Medicines ordered:  Oral lasix  40 ordered in ED   Plan Workup with low concern for ACS. Elevated BNP, pleural effusions seen on CXR, and BLE edema concerning for fluid overload. Discussed and gave  oral lasix  40 in ED. Patient stable for discharge home. Plan for 3 days of oral lasix  20 daily following discharge.   Final diagnoses:  Chest tightness  Hypervolemia, unspecified hypervolemia type    ED Discharge Orders          Ordered    furosemide  (LASIX ) 20 MG tablet  Daily        04/17/24 2307               Diona Perkins, MD 04/17/24 2331    Patt Alm Macho, MD 04/18/24 1534

## 2024-04-17 NOTE — ED Provider Triage Note (Signed)
 Emergency Medicine Provider Triage Evaluation Note  Dalton Cardenas , a 89 y.o. male  was evaluated in triage.  Pt complains of chest tightness, shortness of breath on exertion, abdominal bloating.  Patient reports chest tightness since this morning as well as shortness of breath on exertion, he suspects that this is secondary to A-fib, he has a history of A-fib and is on Eliquis .  Chest discomfort radiates up his neck.  He also endorses approximately 1 month of abdominal bloating, no nausea/vomiting.  Review of Systems  Positive: As above Negative: As above  Physical Exam  BP (!) 141/70 (BP Location: Right Arm)   Pulse (!) 55   Temp (!) 97.5 F (36.4 C)   Resp (!) 21   Ht 5' 7 (1.702 m)   Wt 71.7 kg   SpO2 95%   BMI 24.75 kg/m  Gen:   Awake, no distress   Resp:  Normal effort  MSK:   Moves extremities without difficulty  Other:  Irregularly irregular rhythm with systolic ejection murmur  Medical Decision Making  Medically screening exam initiated at 1:37 PM.  Appropriate orders placed.  Fabiano D Nieman was informed that the remainder of the evaluation will be completed by another provider, this initial triage assessment does not replace that evaluation, and the importance of remaining in the ED until their evaluation is complete.     Glendia Rocky SAILOR, [place] 04/17/24 1338

## 2024-04-17 NOTE — Telephone Encounter (Signed)
 Reviewed with Dr Ladona who recommends patient go to ED. I spoke with patient who reports shortness of breath has been going on for awhile but is worsening.  Chest pressure goes to his throat and ears.  Patient advised to go to ED at [facility]

## 2024-04-17 NOTE — ED Triage Notes (Signed)
 Pt to ED c/o tightness in chest for a couple of days. Pt reports SHOB, Reports all worse with exertion.

## 2024-04-17 NOTE — ED Notes (Signed)
 Received patient from waiting room, all VSS pt denies chest pain at the current moment, wife at bedside awaiting provider evaluation.

## 2024-04-17 NOTE — Discharge Instructions (Addendum)
 You were given a fluid pill to remove extra fluid from your body. Please continue taking this medication for the next 3 days. Plan to see your cardiologist soon.

## 2024-04-20 ENCOUNTER — Telehealth: Payer: Self-pay | Admitting: Cardiology

## 2024-04-20 DIAGNOSIS — I5031 Acute diastolic (congestive) heart failure: Secondary | ICD-10-CM

## 2024-04-20 NOTE — Telephone Encounter (Signed)
 Left message to call back

## 2024-04-20 NOTE — Telephone Encounter (Signed)
 Patient states that he want to see another dr because Dr. Ladona didn't havent any appt available any time soon. Please advise

## 2024-04-20 NOTE — Telephone Encounter (Signed)
 I understand Dr. Ladona is away. I reviewed chart. Needs stat echocardiogram (preferably this week) for acute HFpEF and then follow up in the office (same day as echo okay with me). If I have anything available, happy to see, otherwise DOD is an option.  Thanks MJP

## 2024-04-20 NOTE — Telephone Encounter (Signed)
 Have you checked with Dr. Ladona?   Thanks MJP

## 2024-04-21 NOTE — Telephone Encounter (Signed)
 Any chance echo could be sooner that day? I am quarter day that morning and happy to see him after echo, but wont be able to see him after the echo if echo is at 11:30. Consider seeing someone else in that case, after the echo. Alternativelym I can see him before the echo, perhaps at 9:20.  Thanks MJP

## 2024-04-21 NOTE — Telephone Encounter (Signed)
 There are no sooner appointments available for echo that day.  I spoke with patient and he would like to see Dr Elmira.  Appointment made with Dr Elmira for 9:20 on 8/7 with echo at 11:30 same day

## 2024-04-23 ENCOUNTER — Encounter: Payer: Self-pay | Admitting: Cardiology

## 2024-04-23 ENCOUNTER — Other Ambulatory Visit (HOSPITAL_COMMUNITY): Payer: Self-pay

## 2024-04-23 ENCOUNTER — Ambulatory Visit
Admission: RE | Admit: 2024-04-23 | Discharge: 2024-04-23 | Disposition: A | Discharge status: Home / Self Care | Source: Ambulatory Visit | Attending: Cardiology | Admitting: Cardiology

## 2024-04-23 ENCOUNTER — Ambulatory Visit: Admitting: Cardiology

## 2024-04-23 VITALS — BP 144/80 | HR 55 | Ht 67.0 in | Wt 158.0 lb

## 2024-04-23 DIAGNOSIS — I5033 Acute on chronic diastolic (congestive) heart failure: Secondary | ICD-10-CM | POA: Insufficient documentation

## 2024-04-23 DIAGNOSIS — I480 Paroxysmal atrial fibrillation: Secondary | ICD-10-CM | POA: Insufficient documentation

## 2024-04-23 DIAGNOSIS — I5031 Acute diastolic (congestive) heart failure: Secondary | ICD-10-CM | POA: Insufficient documentation

## 2024-04-23 DIAGNOSIS — I5032 Chronic diastolic (congestive) heart failure: Secondary | ICD-10-CM | POA: Insufficient documentation

## 2024-04-23 LAB — ECHOCARDIOGRAM COMPLETE
AR max vel: 2.21 cm2
AV Area VTI: 2.52 cm2
AV Area mean vel: 2.15 cm2
AV Mean grad: 6.6 mmHg
AV Peak grad: 12 mmHg
Ao pk vel: 1.73 m/s
Area-P 1/2: 3.17 cm2
Height: 67 in
MV M vel: 5.45 m/s
MV Peak grad: 118.8 mmHg
MV VTI: 2.32 cm2
P 1/2 time: 809 ms
Radius: 0.67 cm
S' Lateral: 2 cm
Weight: 2528 [oz_av]

## 2024-04-23 MED ORDER — FUROSEMIDE 20 MG PO TABS
20.0000 mg | ORAL_TABLET | Freq: Every day | ORAL | 3 refills | Status: AC
  Filled 2024-04-23: qty 90, 90d supply, fill #0
  Filled 2024-07-27: qty 90, 90d supply, fill #1
  Filled 2024-10-20: qty 90, 90d supply, fill #0

## 2024-04-23 NOTE — H&P (View-Only) (Signed)
 Cardiology Office Note:  .   Date:  04/23/2024  ID:  Dalton Cardenas, DOB 1934/04/04, MRN [id number] PCP: Elliot Charm, MD  [facility] Providers Cardiologist:  Newman Lawrence, MD PCP: Elliot Charm, MD  Chief Complaint  Patient presents with   Hypertension   Hyperlipidemia   Paroxysmal A-fib/flutter   Leg Swelling     Dalton Cardenas is a 89 y.o. male with hypertension, hyperlipidemia, OSA on CPAP, AVNRT ablation 2016, paroxysmal A-fib/flutter, carotid artery disease s/p TCAR in 2021, h/o spontaneous DVT  Discussed the use of AI scribe software for clinical note transcription with the patient, who gave verbal consent to proceed.  History of Present Illness  Patient was last seen by Dr. Ladona in office in 01/2024.  Patient presented to [facility] emergency room on 04/17/2024 with complaints of abdominal distention, shortness of breath, and leg swelling.  Chest x-ray showed bilateral pleural effusions, BNP elevated at 800.  He was felt to be in mild heart failure exacerbation.  He was given Lasix  40 mg p.o., followed by 20 mg daily.  Patient did mention concerns about prior history of hyponatremia while on Lasix .  On chart review, last noted hyponatremia was not 2021, as low as 119 then.  Per review of his hospitalization notes in 2021, hyponatremia was thought to be multifactorial, likely secondary to spironolactone , duloxetine , Cymbalta , as well as nausea and vomiting.  It improved with hydration.  Spironolactone , duloxetine , Cymbalta  were discontinued.  He has not had any recurrence of hyponatremia since then.  Patient took Lasix  for 3 days after his ER visit, has not been taking Lasix  since then.  He continues to have leg swelling and some abdominal bloating.  He did have chest tightness at the time of ER visit, which has since resolved.  He does not check weight regularly.  Patient is also seeing Dr. Renaye Sous for abdominal bloating.  Patient was supposed  to visit his daughter in [place]  next week, but they have canceled the trip.    Vitals:   04/23/24 0917  BP: (!) 144/80  Pulse: (!) 55  SpO2: 98%      Review of Systems  Cardiovascular:  Positive for leg swelling. Negative for chest pain, dyspnea on exertion, palpitations and syncope.  Gastrointestinal:  Positive for bloating.        Studies Reviewed: SABRA        EKG 04/23/2024: Atrial fibrillation with slow ventricular response 55 bpm Left anterior fascicular block Septal infarct (cited on or before 17-Apr-2024) ST & T wave abnormality, consider lateral ischemia When compared with ECG of 17-Apr-2024 12:34, No significant change was found  Labs 04/2024: Hb 12.1 Cr 1.06, T.bili 1.3 BNP 806 Tropo HS 9, 12  Labs 08/2023-11/2023: Chol 177, TG 167, HDL 60, LDL 89 HbA1C 6.2%   Echocardiogram 03/2023:  Left ventricle cavity is normal in size. Mild concentric hypertrophy of  the left ventricle. Normal global wall motion. Normal LV systolic function  with EF 69%. Doppler evidence of grade I (impaired) diastolic dysfunction,  normal LAP.  Left atrial cavity is mildly dilated at 40.5 ml/m^2.  Trileaflet aortic valve with mild calcification. Mild aortic stenosis.  Vmax 1.8 m/sec, mean PG 7 mmHg, AVA 1.2 cm by continuity equation. Trace  aortic regurgitation.  Mild calcification of the mitral valve annulus. Mild mitral valve leaflet  thickening. Trace mitral regurgitation.  Mild to moderate tricuspid regurgitation. Estimated pulmonary artery  systolic pressure 26 mmHg.  Personally compared with previous study on  02/23/2022, septal bulge less  prominent. AI and TR were reported moderate then, estimated PASP was 39  mmHg.   Zio Patch Extended out patient EKG monitoring 13 days starting 07/03/2022:   Dominant rhythm Sinus.  First degree AV block was present. Ventricular bigeminy was present, longest episode 4.6 seconds. HR 39-92 bpm. Avg HR 55 bpm. No atrial  fibrillation/atrial flutter/SVT/VT/high grade AV block, sinus pause >3sec noted. 14 runs of probable atrial tachycardia with fastest interval lasting 6 beats with a max heart rate of 113 bpm. Isolated SVE <1.0%, SVE Couplets <1.0%, SVE Triplets <1.0% Isolated VE <1%, VE Couplets <1.0%, VE Triplets <1.0% Symptoms reported: None    Risk Assessment/Calculations:    CHA2DS2-VASc Score = 4   This indicates a 4.8% annual risk of stroke. The patient's score is based upon: CHF History: 0 HTN History: 1 Diabetes History: 0 Stroke History: 0 Vascular Disease History: 1 Age Score: 2 Gender Score: 0      Physical Exam Vitals and nursing note reviewed.  Constitutional:      General: He is not in acute distress. Neck:     Vascular: No JVD.  Cardiovascular:     Rate and Rhythm: Bradycardia present. Rhythm irregular.     Heart sounds: Murmur heard.     High-pitched blowing holosystolic murmur is present with a grade of 2/6 at the apex.  Pulmonary:     Effort: Pulmonary effort is normal.     Breath sounds: Normal breath sounds. No wheezing or rales.  Musculoskeletal:     Right lower leg: Edema (2+) present.     Left lower leg: Edema (2+) present.      VISIT DIAGNOSES:   ICD-10-CM   1. Paroxysmal atrial fibrillation (HCC)  I48.0 EKG 12-Lead    Basic metabolic panel with GFR    Brain natriuretic peptide    Brain natriuretic peptide    Basic metabolic panel with GFR    2. Acute on chronic heart failure with preserved ejection fraction (HCC)  I50.33        Dalton Cardenas is a 89 y.o. male with hypertension, hyperlipidemia, OSA on CPAP, AVNRT ablation 2016, paroxysmal A-fib/flutter, carotid artery disease s/p TCAR in 2021, h/o spontaneous DVT  Assessment & Plan  HFpEF: Abdominal distention, leg edema, mild pleural effusion on chest x-ray, elevated BNP. Suspect HFpEF related to longstanding A-fib and possibly mitral regurgitation. Recommend Lasix  20 mg daily.  Patient was  initially reluctant to start Lasix  given prior history of hyponatremia.  However, chart review suggest that the hyponatremia was more likely to be due to spironolactone  and Cymbalta .  We will check BMP and BNP next week to ensure there is no significant hyponatremia. Patient is getting echocardiogram today, will follow-up with results.  Paroxysmal A-fib: In A-fib with slow heart rate in 50s today.  Patient was in A-fib during ER visit on 04/17/2024 as well.  He was in sinus rhythm at previous office visit in 01/2024. It is possible that A-fib is contributing to his HFpEF exacerbation.  However, given paroxysmal nature of his A-fib, it is possible that he has other episodes of A-fib from time to time as well.  Given his ventricular rate of 55 bpm in A-fib, I suspect that he may also have some degree of sinus node dysfunction.  Cardioversion may pose a small risk sinus pause requiring at least temporary pacing.  Therefore, I would try conservative management for his HFpEF with Lasix  for now and follow-up in 3 weeks.  If symptoms of heart failure remain and if he stays in A-fib at that time, we would then consider cardioversion. Given ongoing use of Eliquis  and no other clear indication for aspirin , I think is risks of bleeding outweigh benefits.  Therefore, I recommend stopping aspirin .    Meds ordered this encounter  Medications   furosemide  (LASIX ) 20 MG tablet    Sig: Take 1 tablet (20 mg total) by mouth daily.    Dispense:  90 tablet    Refill:  3     F/u in 3 weeks  Signed, Newman JINNY Lawrence, MD

## 2024-04-23 NOTE — Patient Instructions (Signed)
 Medication Instructions:  START Lasix  20 mg daily   STOP Aspirin    *If you need a refill on your cardiac medications before your next appointment, please call your pharmacy*  Lab Work IN 1 WEEK: Bmp Bnp  If you have labs (blood work) drawn today and your tests are completely normal, you will receive your results only by: MyChart Message (if you have MyChart) OR A paper copy in the mail If you have any lab test that is abnormal or we need to change your treatment, we will call you to review the results.   Follow-Up: At [facility], you and your health needs are our priority.  As part of our continuing mission to provide you with exceptional heart care, our providers are all part of one team.  This team includes your primary Cardiologist (physician) and Advanced Practice Providers or APPs (Physician Assistants and Nurse Practitioners) who all work together to provide you with the care you need, when you need it.  Your next appointment:   3 week(s)  Provider:   Gordy Bergamo, MD OR Dr. Elmira

## 2024-04-23 NOTE — Progress Notes (Signed)
 Cardiology Office Note:  .   Date:  04/23/2024  ID:  Dalton Cardenas, DOB 1934/04/04, MRN [id number] PCP: Elliot Charm, MD  [facility] Providers Cardiologist:  Newman Lawrence, MD PCP: Elliot Charm, MD  Chief Complaint  Patient presents with   Hypertension   Hyperlipidemia   Paroxysmal A-fib/flutter   Leg Swelling     Dalton Cardenas is a 89 y.o. male with hypertension, hyperlipidemia, OSA on CPAP, AVNRT ablation 2016, paroxysmal A-fib/flutter, carotid artery disease s/p TCAR in 2021, h/o spontaneous DVT  Discussed the use of AI scribe software for clinical note transcription with the patient, who gave verbal consent to proceed.  History of Present Illness  Patient was last seen by Dr. Ladona in office in 01/2024.  Patient presented to [facility] emergency room on 04/17/2024 with complaints of abdominal distention, shortness of breath, and leg swelling.  Chest x-ray showed bilateral pleural effusions, BNP elevated at 800.  He was felt to be in mild heart failure exacerbation.  He was given Lasix  40 mg p.o., followed by 20 mg daily.  Patient did mention concerns about prior history of hyponatremia while on Lasix .  On chart review, last noted hyponatremia was not 2021, as low as 119 then.  Per review of his hospitalization notes in 2021, hyponatremia was thought to be multifactorial, likely secondary to spironolactone , duloxetine , Cymbalta , as well as nausea and vomiting.  It improved with hydration.  Spironolactone , duloxetine , Cymbalta  were discontinued.  He has not had any recurrence of hyponatremia since then.  Patient took Lasix  for 3 days after his ER visit, has not been taking Lasix  since then.  He continues to have leg swelling and some abdominal bloating.  He did have chest tightness at the time of ER visit, which has since resolved.  He does not check weight regularly.  Patient is also seeing Dr. Renaye Sous for abdominal bloating.  Patient was supposed  to visit his daughter in [place]  next week, but they have canceled the trip.    Vitals:   04/23/24 0917  BP: (!) 144/80  Pulse: (!) 55  SpO2: 98%      Review of Systems  Cardiovascular:  Positive for leg swelling. Negative for chest pain, dyspnea on exertion, palpitations and syncope.  Gastrointestinal:  Positive for bloating.        Studies Reviewed: SABRA        EKG 04/23/2024: Atrial fibrillation with slow ventricular response 55 bpm Left anterior fascicular block Septal infarct (cited on or before 17-Apr-2024) ST & T wave abnormality, consider lateral ischemia When compared with ECG of 17-Apr-2024 12:34, No significant change was found  Labs 04/2024: Hb 12.1 Cr 1.06, T.bili 1.3 BNP 806 Tropo HS 9, 12  Labs 08/2023-11/2023: Chol 177, TG 167, HDL 60, LDL 89 HbA1C 6.2%   Echocardiogram 03/2023:  Left ventricle cavity is normal in size. Mild concentric hypertrophy of  the left ventricle. Normal global wall motion. Normal LV systolic function  with EF 69%. Doppler evidence of grade I (impaired) diastolic dysfunction,  normal LAP.  Left atrial cavity is mildly dilated at 40.5 ml/m^2.  Trileaflet aortic valve with mild calcification. Mild aortic stenosis.  Vmax 1.8 m/sec, mean PG 7 mmHg, AVA 1.2 cm by continuity equation. Trace  aortic regurgitation.  Mild calcification of the mitral valve annulus. Mild mitral valve leaflet  thickening. Trace mitral regurgitation.  Mild to moderate tricuspid regurgitation. Estimated pulmonary artery  systolic pressure 26 mmHg.  Personally compared with previous study on  02/23/2022, septal bulge less  prominent. AI and TR were reported moderate then, estimated PASP was 39  mmHg.   Zio Patch Extended out patient EKG monitoring 13 days starting 07/03/2022:   Dominant rhythm Sinus.  First degree AV block was present. Ventricular bigeminy was present, longest episode 4.6 seconds. HR 39-92 bpm. Avg HR 55 bpm. No atrial  fibrillation/atrial flutter/SVT/VT/high grade AV block, sinus pause >3sec noted. 14 runs of probable atrial tachycardia with fastest interval lasting 6 beats with a max heart rate of 113 bpm. Isolated SVE <1.0%, SVE Couplets <1.0%, SVE Triplets <1.0% Isolated VE <1%, VE Couplets <1.0%, VE Triplets <1.0% Symptoms reported: None    Risk Assessment/Calculations:    CHA2DS2-VASc Score = 4   This indicates a 4.8% annual risk of stroke. The patient's score is based upon: CHF History: 0 HTN History: 1 Diabetes History: 0 Stroke History: 0 Vascular Disease History: 1 Age Score: 2 Gender Score: 0      Physical Exam Vitals and nursing note reviewed.  Constitutional:      General: He is not in acute distress. Neck:     Vascular: No JVD.  Cardiovascular:     Rate and Rhythm: Bradycardia present. Rhythm irregular.     Heart sounds: Murmur heard.     High-pitched blowing holosystolic murmur is present with a grade of 2/6 at the apex.  Pulmonary:     Effort: Pulmonary effort is normal.     Breath sounds: Normal breath sounds. No wheezing or rales.  Musculoskeletal:     Right lower leg: Edema (2+) present.     Left lower leg: Edema (2+) present.      VISIT DIAGNOSES:   ICD-10-CM   1. Paroxysmal atrial fibrillation (HCC)  I48.0 EKG 12-Lead    Basic metabolic panel with GFR    Brain natriuretic peptide    Brain natriuretic peptide    Basic metabolic panel with GFR    2. Acute on chronic heart failure with preserved ejection fraction (HCC)  I50.33        Dalton Cardenas is a 89 y.o. male with hypertension, hyperlipidemia, OSA on CPAP, AVNRT ablation 2016, paroxysmal A-fib/flutter, carotid artery disease s/p TCAR in 2021, h/o spontaneous DVT  Assessment & Plan  HFpEF: Abdominal distention, leg edema, mild pleural effusion on chest x-ray, elevated BNP. Suspect HFpEF related to longstanding A-fib and possibly mitral regurgitation. Recommend Lasix  20 mg daily.  Patient was  initially reluctant to start Lasix  given prior history of hyponatremia.  However, chart review suggest that the hyponatremia was more likely to be due to spironolactone  and Cymbalta .  We will check BMP and BNP next week to ensure there is no significant hyponatremia. Patient is getting echocardiogram today, will follow-up with results.  Paroxysmal A-fib: In A-fib with slow heart rate in 50s today.  Patient was in A-fib during ER visit on 04/17/2024 as well.  He was in sinus rhythm at previous office visit in 01/2024. It is possible that A-fib is contributing to his HFpEF exacerbation.  However, given paroxysmal nature of his A-fib, it is possible that he has other episodes of A-fib from time to time as well.  Given his ventricular rate of 55 bpm in A-fib, I suspect that he may also have some degree of sinus node dysfunction.  Cardioversion may pose a small risk sinus pause requiring at least temporary pacing.  Therefore, I would try conservative management for his HFpEF with Lasix  for now and follow-up in 3 weeks.  If symptoms of heart failure remain and if he stays in A-fib at that time, we would then consider cardioversion. Given ongoing use of Eliquis  and no other clear indication for aspirin , I think is risks of bleeding outweigh benefits.  Therefore, I recommend stopping aspirin .    Meds ordered this encounter  Medications   furosemide  (LASIX ) 20 MG tablet    Sig: Take 1 tablet (20 mg total) by mouth daily.    Dispense:  90 tablet    Refill:  3     F/u in 3 weeks  Signed, Newman JINNY Lawrence, MD

## 2024-04-24 ENCOUNTER — Ambulatory Visit: Payer: Self-pay | Admitting: Cardiology

## 2024-04-24 NOTE — Telephone Encounter (Signed)
 Perfect.  Thanks MJP

## 2024-04-29 DIAGNOSIS — I480 Paroxysmal atrial fibrillation: Secondary | ICD-10-CM | POA: Diagnosis not present

## 2024-04-29 DIAGNOSIS — I10 Essential (primary) hypertension: Secondary | ICD-10-CM | POA: Diagnosis not present

## 2024-04-29 DIAGNOSIS — I5032 Chronic diastolic (congestive) heart failure: Secondary | ICD-10-CM | POA: Diagnosis not present

## 2024-04-30 ENCOUNTER — Ambulatory Visit: Payer: Self-pay | Admitting: *Deleted

## 2024-04-30 DIAGNOSIS — R002 Palpitations: Secondary | ICD-10-CM

## 2024-04-30 LAB — BASIC METABOLIC PANEL WITH GFR
BUN/Creatinine Ratio: 16 (ref 10–24)
BUN: 18 mg/dL (ref 8–27)
CO2: 20 mmol/L (ref 20–29)
Calcium: 9.2 mg/dL (ref 8.6–10.2)
Chloride: 100 mmol/L (ref 96–106)
Creatinine, Ser: 1.14 mg/dL (ref 0.76–1.27)
Glucose: 136 mg/dL — ABNORMAL HIGH (ref 70–99)
Potassium: 4.1 mmol/L (ref 3.5–5.2)
Sodium: 135 mmol/L (ref 134–144)
eGFR: 62 mL/min/1.73 (ref >59)

## 2024-04-30 LAB — BRAIN NATRIURETIC PEPTIDE: BNP: 565 pg/mL — ABNORMAL HIGH (ref 0.0–100.0)

## 2024-05-01 DIAGNOSIS — I509 Heart failure, unspecified: Secondary | ICD-10-CM | POA: Diagnosis not present

## 2024-05-01 DIAGNOSIS — K219 Gastro-esophageal reflux disease without esophagitis: Secondary | ICD-10-CM | POA: Diagnosis not present

## 2024-05-01 DIAGNOSIS — R7989 Other specified abnormal findings of blood chemistry: Secondary | ICD-10-CM | POA: Diagnosis not present

## 2024-05-01 NOTE — Progress Notes (Addendum)
 Pt called for pre procedure instructions.  Unable to reach pt, message left on voicemail. Arrival time 0800 NPO after midnight explained Instructed to take am meds with sip of water and confirmed blood thinner consistency Instructed pt need for ride home tomorrow and have responsible adult with them for 24 hrs post procedure.

## 2024-05-04 ENCOUNTER — Ambulatory Visit (HOSPITAL_COMMUNITY)
Admission: RE | Admit: 2024-05-04 | Discharge: 2024-05-04 | Disposition: A | Discharge status: Home / Self Care | Attending: Cardiovascular Disease | Admitting: Cardiovascular Disease

## 2024-05-04 ENCOUNTER — Ambulatory Visit (HOSPITAL_COMMUNITY): Admitting: Certified Registered"

## 2024-05-04 ENCOUNTER — Encounter (HOSPITAL_COMMUNITY)
Admission: RE | Disposition: A | Discharge status: Home / Self Care | Payer: Self-pay | Source: Home / Self Care | Attending: Cardiovascular Disease

## 2024-05-04 ENCOUNTER — Encounter (HOSPITAL_COMMUNITY): Payer: Self-pay | Admitting: Cardiovascular Disease

## 2024-05-04 ENCOUNTER — Ambulatory Visit (HOSPITAL_BASED_OUTPATIENT_CLINIC_OR_DEPARTMENT_OTHER): Admitting: Certified Registered"

## 2024-05-04 ENCOUNTER — Other Ambulatory Visit: Payer: Self-pay

## 2024-05-04 DIAGNOSIS — I739 Peripheral vascular disease, unspecified: Secondary | ICD-10-CM | POA: Diagnosis not present

## 2024-05-04 DIAGNOSIS — I4819 Other persistent atrial fibrillation: Secondary | ICD-10-CM

## 2024-05-04 DIAGNOSIS — I4892 Unspecified atrial flutter: Secondary | ICD-10-CM | POA: Insufficient documentation

## 2024-05-04 DIAGNOSIS — I5033 Acute on chronic diastolic (congestive) heart failure: Secondary | ICD-10-CM | POA: Diagnosis not present

## 2024-05-04 DIAGNOSIS — I4719 Other supraventricular tachycardia: Secondary | ICD-10-CM | POA: Diagnosis not present

## 2024-05-04 DIAGNOSIS — G4733 Obstructive sleep apnea (adult) (pediatric): Secondary | ICD-10-CM | POA: Insufficient documentation

## 2024-05-04 DIAGNOSIS — Z7901 Long term (current) use of anticoagulants: Secondary | ICD-10-CM | POA: Diagnosis not present

## 2024-05-04 DIAGNOSIS — Z79899 Other long term (current) drug therapy: Secondary | ICD-10-CM | POA: Insufficient documentation

## 2024-05-04 DIAGNOSIS — M7989 Other specified soft tissue disorders: Secondary | ICD-10-CM | POA: Insufficient documentation

## 2024-05-04 DIAGNOSIS — I480 Paroxysmal atrial fibrillation: Secondary | ICD-10-CM | POA: Insufficient documentation

## 2024-05-04 DIAGNOSIS — I4891 Unspecified atrial fibrillation: Secondary | ICD-10-CM | POA: Diagnosis not present

## 2024-05-04 DIAGNOSIS — I2510 Atherosclerotic heart disease of native coronary artery without angina pectoris: Secondary | ICD-10-CM | POA: Insufficient documentation

## 2024-05-04 DIAGNOSIS — I110 Hypertensive heart disease with heart failure: Secondary | ICD-10-CM | POA: Diagnosis not present

## 2024-05-04 DIAGNOSIS — E785 Hyperlipidemia, unspecified: Secondary | ICD-10-CM | POA: Diagnosis not present

## 2024-05-04 DIAGNOSIS — I779 Disorder of arteries and arterioles, unspecified: Secondary | ICD-10-CM | POA: Insufficient documentation

## 2024-05-04 DIAGNOSIS — Z86718 Personal history of other venous thrombosis and embolism: Secondary | ICD-10-CM | POA: Diagnosis not present

## 2024-05-04 HISTORY — PX: CARDIOVERSION: EP1203

## 2024-05-04 SURGERY — CARDIOVERSION (CATH LAB)
Anesthesia: General

## 2024-05-04 MED ORDER — LIDOCAINE 2% (20 MG/ML) 5 ML SYRINGE
INTRAMUSCULAR | Status: DC | PRN
  Administered 2024-05-04: 60 mg via INTRAVENOUS

## 2024-05-04 MED ORDER — PROPOFOL 10 MG/ML IV BOLUS
INTRAVENOUS | Status: DC | PRN
  Administered 2024-05-04: 60 mg via INTRAVENOUS

## 2024-05-04 MED ORDER — SODIUM CHLORIDE 0.9 % IV SOLN: INTRAVENOUS | Status: DC

## 2024-05-04 SURGICAL SUPPLY — 1 items: PAD DEFIB RADIO PHYSIO CONN (PAD) ×1 IMPLANT

## 2024-05-04 NOTE — Op Note (Signed)
 Procedure: Electrical Cardioversion Indications:  Atrial Fibrillation  Procedure Details:  Consent: Risks of procedure as well as the alternatives and risks of each were explained to the (patient/caregiver).  Consent for procedure obtained.  Time Out: Verified patient identification, verified procedure, site/side was marked, verified correct patient position, special equipment/implants available, medications/allergies/relevent history reviewed, required imaging and test results available.  Performed  Patient placed on cardiac monitor, pulse oximetry, supplemental oxygen as necessary.  Sedation given: propofol  IV, Dr. Maryclare Pacer pads placed anterior and posterior chest.  Cardioverted 1 time(s).  Cardioversion with synchronized biphasic 150J shock.  Evaluation: Findings: Post procedure EKG shows: NSR Complications: None Patient did tolerate procedure well.  Time Spent Directly with the Patient:  30 minutes   Tandy Lewin 05/04/2024, 9:01 AM

## 2024-05-04 NOTE — Interval H&P Note (Signed)
 History and Physical Interval Note:  05/04/2024 8:11 AM  [name]  has presented today for surgery, with the diagnosis of AFIB.  The various methods of treatment have been discussed with the patient and family. After consideration of risks, benefits and other options for treatment, the patient has consented to  Procedure(s): CARDIOVERSION (N/A) as a surgical intervention.  The patient's history has been reviewed, patient examined, no change in status, stable for surgery.  I have reviewed the patient's chart and labs.  Questions were answered to the patient's satisfaction.     Dalton Cardenas

## 2024-05-04 NOTE — Anesthesia Preprocedure Evaluation (Signed)
 Anesthesia Evaluation  Patient identified by MRN, date of birth, ID band Patient awake    Reviewed: Allergy & Precautions, H&P , NPO status , Patient's Chart, lab work & pertinent test results  Airway Mallampati: II   Neck ROM: full    Dental   Pulmonary sleep apnea    breath sounds clear to auscultation       Cardiovascular hypertension, + CAD, + Peripheral Vascular Disease and +CHF  + dysrhythmias Atrial Fibrillation  Rhythm:irregular Rate:Normal     Neuro/Psych    GI/Hepatic ,GERD  ,,  Endo/Other    Renal/GU      Musculoskeletal  (+) Arthritis ,    Abdominal   Peds  Hematology   Anesthesia Other Findings   Reproductive/Obstetrics                              Anesthesia Physical Anesthesia Plan  ASA: 3  Anesthesia Plan: General   Post-op Pain Management:    Induction: Intravenous  PONV Risk Score and Plan: 2 and Propofol  infusion and Treatment may vary due to age or medical condition  Airway Management Planned: Nasal Cannula  Additional Equipment:   Intra-op Plan:   Post-operative Plan:   Informed Consent: I have reviewed the patients History and Physical, chart, labs and discussed the procedure including the risks, benefits and alternatives for the proposed anesthesia with the patient or authorized representative who has indicated his/her understanding and acceptance.     Dental advisory given  Plan Discussed with: CRNA, Anesthesiologist and Surgeon  Anesthesia Plan Comments:         Anesthesia Quick Evaluation

## 2024-05-04 NOTE — Transfer of Care (Signed)
 Immediate Anesthesia Transfer of Care Note  Patient: Dalton Cardenas  Procedure(s) Performed: CARDIOVERSION  Patient Location: Cath Lab  Anesthesia Type:General  Level of Consciousness: drowsy and patient cooperative  Airway & Oxygen Therapy: Patient Spontanous Breathing and Patient connected to nasal cannula oxygen  Post-op Assessment: Report given to RN and Post -op Vital signs reviewed and stable  Post vital signs: Reviewed and stable  Last Vitals:  Vitals Value Taken Time  BP 113/97 05/04/24 08:45  Temp    Pulse 64 05/04/24 08:48  Resp 14 05/04/24 08:48  SpO2 97 % 05/04/24 08:48  Vitals shown include unfiled device data.  Last Pain:  Vitals:   05/04/24 0811  TempSrc: Temporal         Complications: No notable events documented.

## 2024-05-05 ENCOUNTER — Other Ambulatory Visit: Payer: Self-pay | Admitting: Cardiology

## 2024-05-05 DIAGNOSIS — I10 Essential (primary) hypertension: Secondary | ICD-10-CM

## 2024-05-05 NOTE — Anesthesia Postprocedure Evaluation (Signed)
 Anesthesia Post Note  Patient: Dalton Cardenas  Procedure(s) Performed: CARDIOVERSION     Patient location during evaluation: PACU Anesthesia Type: General Level of consciousness: awake and alert Pain management: pain level controlled Vital Signs Assessment: post-procedure vital signs reviewed and stable Respiratory status: spontaneous breathing, nonlabored ventilation, respiratory function stable and patient connected to nasal cannula oxygen Cardiovascular status: blood pressure returned to baseline and stable Postop Assessment: no apparent nausea or vomiting Anesthetic complications: no   No notable events documented.  Last Vitals:  Vitals:   05/04/24 0811 05/04/24 0902  BP: 136/83 (!) 103/54  Pulse: 72 (!) 55  Resp: 13 18  Temp: 36.4 C   SpO2: 99%     Last Pain:  Vitals:   05/04/24 0811  TempSrc: Temporal                 Azura Tufaro S

## 2024-05-14 ENCOUNTER — Other Ambulatory Visit: Payer: Self-pay

## 2024-05-14 ENCOUNTER — Other Ambulatory Visit (HOSPITAL_COMMUNITY): Payer: Self-pay

## 2024-05-14 ENCOUNTER — Encounter: Payer: Self-pay | Admitting: Cardiology

## 2024-05-14 ENCOUNTER — Ambulatory Visit: Attending: Cardiology | Admitting: Cardiology

## 2024-05-14 VITALS — BP 124/84 | HR 62 | Ht 67.0 in | Wt 149.6 lb

## 2024-05-14 DIAGNOSIS — I480 Paroxysmal atrial fibrillation: Secondary | ICD-10-CM

## 2024-05-14 DIAGNOSIS — I10 Essential (primary) hypertension: Secondary | ICD-10-CM | POA: Diagnosis not present

## 2024-05-14 DIAGNOSIS — I5032 Chronic diastolic (congestive) heart failure: Secondary | ICD-10-CM

## 2024-05-14 MED ORDER — RIVAROXABAN 15 MG PO TABS
15.0000 mg | ORAL_TABLET | Freq: Every day | ORAL | 5 refills | Status: DC
  Filled 2024-05-14 (×2): qty 30, 30d supply, fill #0

## 2024-05-14 NOTE — Patient Instructions (Addendum)
 Medication Instructions:  START Xarelto  15 mg daily   STOP Eliquis    *If you need a refill on your cardiac medications before your next appointment, please call your pharmacy*  Lab Work 4-6 WEEKS: BMP BNP  If you have labs (blood work) drawn today and your tests are completely normal, you will receive your results only by: MyChart Message (if you have MyChart) OR A paper copy in the mail If you have any lab test that is abnormal or we need to change your treatment, we will call you to review the results.  Follow-Up: At [facility] you and your health needs are our priority.  As part of our continuing mission to provide you with exceptional heart care, our providers are all part of one team.  This team includes your primary Cardiologist (physician) and Advanced Practice Providers or APPs (Physician Assistants and Nurse Practitioners) who all work together to provide you with the care you need, when you need it.  Your next appointment:   3 month(s)  Provider:   Gordy Bergamo, MD OR DR. PATWARDHAN

## 2024-05-14 NOTE — Progress Notes (Signed)
 Cardiology Office Note:  .   Date:  05/14/2024  ID:  [name], DOB 1934-06-25, MRN [id number] PCP: Elliot Charm, MD  [facility] Providers Cardiologist:  Newman Lawrence, MD PCP: Elliot Charm, MD  No chief complaint on file.    KEYMANI MCLEAN is a 89 y.o. 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  Last 2 nights, he is felt brief palpitations when he wakes up to go to the bathroom, but otherwise feeling well.  Leg edema has completely resolved on Lasix  20 mg daily.  He has lost few pounds since being on Lasix , expectedly so.  Reviewed recent lab results with the patient, details below.   Vitals:   05/14/24 1135  BP: 124/84  Pulse: 62  SpO2: 97%       Review of Systems  Cardiovascular:  Positive for leg swelling. Negative for chest pain, dyspnea on exertion, palpitations and syncope.  Gastrointestinal:  Positive for bloating.        Studies Reviewed: SABRA        EKG 05/14/2024: Sinus rhythm 57 bpm with sinus arhrytmia First degree A-V block Left anterior fascicular block Septal infarct (cited on or before 17-Apr-2024) When compared with ECG of 04-May-2024 09:03, No significant change was found  Labs 04/29/2024: Cr 1.14, Na 135 BNP 565  Labs 04/17/2024: Hb 12.1 Cr 1.06, T.bili 1.3 BNP 806 Tropo HS 9, 12  Labs 08/2023-11/2023: Chol 177, TG 167, HDL 60, LDL 89 HbA1C 6.2%  Cardioversion 04/2024  Echocardiogram 03/2023:  Left ventricle cavity is normal in size. Mild concentric hypertrophy of  the left ventricle. Normal global wall motion. Normal LV systolic function  with EF 69%. Doppler evidence of grade I (impaired) diastolic dysfunction,  normal LAP.  Left atrial cavity is  mildly dilated at 40.5 ml/m^2.  Trileaflet aortic valve with mild calcification. Mild aortic stenosis.  Vmax 1.8 m/sec, mean PG 7 mmHg, AVA 1.2 cm by continuity equation. Trace  aortic regurgitation.  Mild calcification of the mitral valve annulus. Mild mitral valve leaflet  thickening. Trace mitral regurgitation.  Mild to moderate tricuspid regurgitation. Estimated pulmonary artery  systolic pressure 26 mmHg.  Personally compared with previous study on 02/23/2022, septal bulge less  prominent. AI and TR were reported moderate then, estimated PASP was 39  mmHg.   Zio Patch Extended out patient EKG monitoring 13 days starting 07/03/2022:   Dominant rhythm Sinus.  First degree AV block was present. Ventricular bigeminy was present, longest episode 4.6 seconds. HR 39-92 bpm. Avg HR 55 bpm. No atrial fibrillation/atrial flutter/SVT/VT/high grade AV block, sinus pause >3sec noted. 14 runs of probable atrial tachycardia with fastest interval lasting 6 beats with a max heart rate of 113 bpm. Isolated SVE <1.0%, SVE Couplets <1.0%, SVE Triplets <1.0% Isolated VE <1%, VE Couplets <1.0%, VE Triplets <1.0% Symptoms reported: None    Risk Assessment/Calculations:    CHA2DS2-VASc Score = 4   This indicates a 4.8% annual risk of stroke. The patient's score is based upon: CHF History: 0 HTN History: 1 Diabetes History: 0 Stroke History: 0 Vascular Disease History: 1 Age Score: 2 Gender Score: 0      Physical Exam Vitals and nursing note reviewed.  Constitutional:      General: He is not in acute distress. Neck:  Vascular: No JVD.  Cardiovascular:     Rate and Rhythm: Normal rate and regular rhythm.     Heart sounds: Murmur heard.     High-pitched blowing holosystolic murmur is present with a grade of 2/6 at the apex.  Pulmonary:     Effort: Pulmonary effort is normal.     Breath sounds: Normal breath sounds. No wheezing or rales.  Musculoskeletal:     Right lower leg: No  edema.     Left lower leg: No edema.      VISIT DIAGNOSES:   ICD-10-CM   1. Essential hypertension, benign  I10 EKG 12-Lead    2. Chronic heart failure with preserved ejection fraction (HCC)  I50.32 Basic metabolic panel with GFR    Brain natriuretic peptide    Brain natriuretic peptide    Basic metabolic panel with GFR    CANCELED: Basic metabolic panel with GFR    CANCELED: Brain natriuretic peptide        Kalyan Barabas Stucky is a 89 y.o. male with hypertension, hyperlipidemia, OSA on CPAP, AVNRT ablation 2016, paroxysmal A-fib/flutter, carotid artery disease s/p TCAR in 2021, h/o spontaneous DVT  Assessment & Plan  Paroxysmal A-fib: S/p cardioversion 04/2024. Maintaining sinus rhythm.  Occasional palpitations noted by the patient.  If more frequent palpitations could consider 2-week ZIO monitor. Patient is currently on Eliquis  2.5 mg twice daily.  With age >25, weight >60 EKG, creatinine <1.5, optimal dose of Eliquis  would be 5 mg twice daily.  On further discussion with the patient, it appears that he has had multiple hemarthrosis in the past, and therefore Eliquis  dose was reduced.  His hemarthrosis episodes were debilitating requiring several months of physical therapy in the past.  He was considered for Watchman procedure also, but given his history of DVT, it was deemed that he would still require anticoagulation. My concern is that Eliquis  2.5 mg twice daily would not be adequately protecting him from DVT or stroke.  However, he is weary of being on Eliquis  5 mg twice daily given the hemarthrosis history.  Alternatively, his optimal Xarelto  dose based on his creatinine clearance <50 would be 15 mg daily.  I do not think this would increase his bleeding risk anymore than Eliquis  2.5 mg twice daily, or 5 mg twice daily.  Therefore, after mutual discussion, we have decided to proceed with Xarelto  15 mg daily.  I have encouraged the patient to have a low threshold to reach out to us  or  his orthopedic surgeon if he notices any significant hemarthrosis anytime.  Separately, patient is on acebutolol  200 mg twice daily for several years.  Reasonable to continue the same.  HFpEF: Severe TR, probably worse while in Afib. No s/p cardioversion 04/2024, BNP improving. Given clinical improvement with Lasix , no significant RV enlargement or dysfunction, and age, I recommend conservative therapy.  Patient would like to travel to [place]  to see his daughter.  He travels to [place]  and [place] routinely.  Given stability of his HFpEF and now back in sinus rhythm, I think it is reasonable to travel with exercising do caution.    Meds ordered this encounter  Medications   Rivaroxaban  (XARELTO ) 15 MG TABS tablet    Sig: Take 1 tablet (15 mg total) by mouth daily.    Dispense:  30 tablet    Refill:  5     F/u in 3 weeks  Signed, Newman JINNY Lawrence, MD

## 2024-05-17 DIAGNOSIS — E782 Mixed hyperlipidemia: Secondary | ICD-10-CM | POA: Diagnosis not present

## 2024-05-17 DIAGNOSIS — I10 Essential (primary) hypertension: Secondary | ICD-10-CM | POA: Diagnosis not present

## 2024-05-17 DIAGNOSIS — E7800 Pure hypercholesterolemia, unspecified: Secondary | ICD-10-CM | POA: Diagnosis not present

## 2024-05-21 NOTE — Telephone Encounter (Signed)
 We can do the blood test as scheduled. I am sorry to hear that you are not able to tolerate the Xarelto . We may have to continue Eliquis  at low dose, although it may not be the adequate dose. Regarding the palpitations, I recommend 2 week Zio monitor to evaluate further.  Hope that helps.  Thanks MJP

## 2024-05-22 ENCOUNTER — Ambulatory Visit: Attending: Cardiology

## 2024-05-22 DIAGNOSIS — R002 Palpitations: Secondary | ICD-10-CM

## 2024-05-22 NOTE — Progress Notes (Unsigned)
 Enrolled patient for a 14 day Zio XT  monitor to be mailed to patients home

## 2024-05-22 NOTE — Telephone Encounter (Signed)
 2.5 mg bid. Patient may have these pills from before.  Thanks MJP

## 2024-05-25 NOTE — Telephone Encounter (Signed)
 Let me clear the confusion.  Correct dose is Eliquis  5 mg bid, but patient was taking 2.5 mg bid due to prior episodes of hemarthrosis. I had suggested change to Xarelto  15 mg daily, but patient did not tolerate due to stomach ache, therefore we mutually agreed to go back to Eliquis  2.5 mg bid. That said, I like the idea of taking 5 mg bid during air travel, given prior h/o DVT.  I will give the patient a call, no other call needed from RN side.  Thanks MJP

## 2024-05-25 NOTE — Telephone Encounter (Signed)
 Noted

## 2024-06-09 ENCOUNTER — Other Ambulatory Visit: Payer: Self-pay | Admitting: Gastroenterology

## 2024-06-09 DIAGNOSIS — Z860100 Personal history of colon polyps, unspecified: Secondary | ICD-10-CM | POA: Diagnosis not present

## 2024-06-09 DIAGNOSIS — R1011 Right upper quadrant pain: Secondary | ICD-10-CM | POA: Diagnosis not present

## 2024-06-09 DIAGNOSIS — K219 Gastro-esophageal reflux disease without esophagitis: Secondary | ICD-10-CM | POA: Diagnosis not present

## 2024-06-11 ENCOUNTER — Ambulatory Visit
Admission: RE | Admit: 2024-06-11 | Discharge: 2024-06-11 | Disposition: A | Discharge status: Home / Self Care | Source: Ambulatory Visit | Attending: Gastroenterology | Admitting: Gastroenterology

## 2024-06-11 DIAGNOSIS — R1011 Right upper quadrant pain: Secondary | ICD-10-CM

## 2024-06-12 DIAGNOSIS — Z86718 Personal history of other venous thrombosis and embolism: Secondary | ICD-10-CM | POA: Diagnosis not present

## 2024-06-12 DIAGNOSIS — Z23 Encounter for immunization: Secondary | ICD-10-CM | POA: Diagnosis not present

## 2024-06-12 DIAGNOSIS — I5032 Chronic diastolic (congestive) heart failure: Secondary | ICD-10-CM | POA: Diagnosis not present

## 2024-06-12 DIAGNOSIS — I4892 Unspecified atrial flutter: Secondary | ICD-10-CM | POA: Diagnosis not present

## 2024-06-12 DIAGNOSIS — E782 Mixed hyperlipidemia: Secondary | ICD-10-CM | POA: Diagnosis not present

## 2024-06-12 DIAGNOSIS — R002 Palpitations: Secondary | ICD-10-CM | POA: Diagnosis not present

## 2024-06-13 LAB — BASIC METABOLIC PANEL WITH GFR
BUN/Creatinine Ratio: 18 (ref 10–24)
BUN: 19 mg/dL (ref 8–27)
CO2: 22 mmol/L (ref 20–29)
Calcium: 9.3 mg/dL (ref 8.6–10.2)
Chloride: 101 mmol/L (ref 96–106)
Creatinine, Ser: 1.04 mg/dL (ref 0.76–1.27)
Glucose: 93 mg/dL (ref 70–99)
Potassium: 4.4 mmol/L (ref 3.5–5.2)
Sodium: 137 mmol/L (ref 134–144)
eGFR: 69 mL/min/1.73 (ref >59)

## 2024-06-13 LAB — BRAIN NATRIURETIC PEPTIDE: BNP: 178.8 pg/mL — ABNORMAL HIGH (ref 0.0–100.0)

## 2024-06-14 ENCOUNTER — Ambulatory Visit: Payer: Self-pay | Admitting: Cardiology

## 2024-06-16 DIAGNOSIS — I10 Essential (primary) hypertension: Secondary | ICD-10-CM | POA: Diagnosis not present

## 2024-06-16 DIAGNOSIS — E782 Mixed hyperlipidemia: Secondary | ICD-10-CM | POA: Diagnosis not present

## 2024-06-16 DIAGNOSIS — E7800 Pure hypercholesterolemia, unspecified: Secondary | ICD-10-CM | POA: Diagnosis not present

## 2024-06-25 DIAGNOSIS — R002 Palpitations: Secondary | ICD-10-CM | POA: Diagnosis not present

## 2024-06-26 ENCOUNTER — Ambulatory Visit: Payer: Self-pay | Admitting: Cardiology

## 2024-06-26 DIAGNOSIS — R002 Palpitations: Secondary | ICD-10-CM | POA: Diagnosis not present

## 2024-06-30 ENCOUNTER — Other Ambulatory Visit: Payer: Self-pay | Admitting: Cardiology

## 2024-06-30 DIAGNOSIS — I10 Essential (primary) hypertension: Secondary | ICD-10-CM

## 2024-07-20 ENCOUNTER — Ambulatory Visit: Attending: Cardiology | Admitting: Cardiology

## 2024-07-20 ENCOUNTER — Encounter: Payer: Self-pay | Admitting: Cardiology

## 2024-07-20 VITALS — BP 160/96 | HR 57 | Ht 66.0 in | Wt 152.8 lb

## 2024-07-20 DIAGNOSIS — I480 Paroxysmal atrial fibrillation: Secondary | ICD-10-CM

## 2024-07-20 DIAGNOSIS — I10 Essential (primary) hypertension: Secondary | ICD-10-CM | POA: Diagnosis not present

## 2024-07-20 DIAGNOSIS — I5032 Chronic diastolic (congestive) heart failure: Secondary | ICD-10-CM | POA: Diagnosis not present

## 2024-07-20 MED ORDER — HYDRALAZINE HCL 50 MG PO TABS: ORAL_TABLET | ORAL | 3 refills | Status: AC

## 2024-07-20 MED ORDER — HYDRALAZINE HCL 100 MG PO TABS
100.0000 mg | ORAL_TABLET | Freq: Two times a day (BID) | ORAL | 3 refills | Status: AC
Start: 2024-07-20 — End: ?

## 2024-07-20 NOTE — Progress Notes (Signed)
 Cardiology Office Note:  .   Date:  07/20/2024  ID:  Dalton Cardenas, DOB 1934-02-02, MRN [id number] PCP: Elliot Charm, MD  [facility] Providers Cardiologist:  Newman Lawrence, MD PCP: Elliot Charm, MD  Chief Complaint  Patient presents with   Atrial Fibrillation     Dalton Cardenas is a 89 y.o. 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  Palpitation symptoms have significantly improved.  Blood pressure is elevated today.  Patient is hoping to make a trip to [place]  to see his daughter soon, and then to [place] in January 2026.  I asked her if it is okay to travel negative as complete resolved on a 6 mg daily.  Reviewed recent lab results with the patient, discussed with her.     Vitals:   07/20/24 1451  BP: (!) 160/96  Pulse: (!) 57  SpO2: 97%        Review of Systems  Cardiovascular:  Positive for leg swelling. Negative for chest pain, dyspnea on exertion, palpitations and syncope.  Gastrointestinal:  Positive for bloating.        Studies Reviewed: SABRA        EKG 07/20/2024: SInus rhythm 55 bpm Atrial bigeminy Left anterior fascicular block Minimal voltage criteria for LVH, may be normal variant ( Cornell product ) Septal infarct (cited on or before 17-Apr-2024) When compared with ECG of 14-May-2024 11:46,  PACs are new     Labs 05/2024: Cr 1.04, eGFR 69 BNP 178   Labs 04/29/2024: Cr 1.14, Na 135 BNP 565  Labs 04/17/2024: Hb 12.1 Cr 1.06, T.bili 1.3 BNP 806 Tropo HS 9, 12  Labs 08/2023-11/2023: Chol 177, TG 167, HDL 60, LDL 89 HbA1C 6.2%  Cardioversion 04/2024  Echocardiogram 03/2023:  Left ventricle cavity is normal in size. Mild concentric hypertrophy of  the left  ventricle. Normal global wall motion. Normal LV systolic function  with EF 69%. Doppler evidence of grade I (impaired) diastolic dysfunction,  normal LAP.  Left atrial cavity is mildly dilated at 40.5 ml/m^2.  Trileaflet aortic valve with mild calcification. Mild aortic stenosis.  Vmax 1.8 m/sec, mean PG 7 mmHg, AVA 1.2 cm by continuity equation. Trace  aortic regurgitation.  Mild calcification of the mitral valve annulus. Mild mitral valve leaflet  thickening. Trace mitral regurgitation.  Mild to moderate tricuspid regurgitation. Estimated pulmonary artery  systolic pressure 26 mmHg.  Personally compared with previous study on 02/23/2022, septal bulge less  prominent. AI and TR were reported moderate then, estimated PASP was 39  mmHg.   Zio Patch Extended out patient EKG monitoring 13 days starting 07/03/2022:   Dominant rhythm Sinus.  First degree AV block was present. Ventricular bigeminy was present, longest episode 4.6 seconds. HR 39-92 bpm. Avg HR 55 bpm. No atrial fibrillation/atrial flutter/SVT/VT/high grade AV block, sinus pause >3sec noted. 14 runs of probable atrial tachycardia with fastest interval lasting 6 beats with a max heart rate of 113 bpm. Isolated SVE <1.0%, SVE Couplets <1.0%, SVE Triplets <1.0% Isolated VE <1%, VE Couplets <1.0%, VE Triplets <1.0% Symptoms reported: None    Risk Assessment/Calculations:    CHA2DS2-VASc Score =     This indicates a  % annual risk of stroke. The patient's score is based upon:        Physical Exam Vitals  and nursing note reviewed.  Constitutional:      General: He is not in acute distress. Neck:     Vascular: No JVD.  Cardiovascular:     Rate and Rhythm: Normal rate and regular rhythm.     Heart sounds: Murmur heard.     High-pitched blowing holosystolic murmur is present with a grade of 2/6 at the apex.  Pulmonary:     Effort: Pulmonary effort is normal.     Breath sounds: Normal breath sounds. No wheezing or rales.   Musculoskeletal:     Right lower leg: No edema.     Left lower leg: No edema.      VISIT DIAGNOSES:   ICD-10-CM   1. Paroxysmal atrial fibrillation (HCC)  I48.0 EKG 12-Lead    2. Essential hypertension, benign  I10 hydrALAZINE  (APRESOLINE ) 100 MG tablet    3. Chronic heart failure with preserved ejection fraction (HCC)  I50.32          Dalton Cardenas is a 89 y.o. male with hypertension, hyperlipidemia, OSA on CPAP, AVNRT ablation 2016, paroxysmal A-fib/flutter, carotid artery disease s/p TCAR in 2021, h/o spontaneous DVT  Assessment & Plan  Paroxysmal A-fib: S/p cardioversion 04/2024. Maintaining sinus rhythm, has atrial bigeminy today. Patient is currently on Eliquis  2.5 mg twice daily.  With age >96, weight >60 EKG, creatinine <1.5, optimal dose of Eliquis  would be 5 mg twice daily.  On further discussion with the patient, it appears that he has had multiple hemarthrosis in the past, and therefore Eliquis  dose was reduced.  His hemarthrosis episodes were debilitating requiring several months of physical therapy in the past.  He was considered for Watchman procedure also, but given his history of DVT, it was deemed that he would still require anticoagulation. He could not tolerate Xarelto  50 mg daily either due to concern for bleeding. Readmission he decided to continue Eliquis  2.5 mg twice daily.  He will consider to take additional dose of Eliquis  to make a 5 mg twice daily during long travel such as to [place]  or to [place]. Separately, patient is on acebutolol  200 mg twice daily for several years.  Reasonable to continue the same.  HFpEF: Severe TR, probably worse while in Afib. No s/p cardioversion 04/2024, BNP improving. Leg mass controlled on Lasix  10 mg daily, continue the same.  Hypertension: Blood pressure elevated today. Increase hydralazine  from 50 mg in a.m. and 100 mg in p.m. to 100 mg twice daily, and 50 mg dose at noon if SBP >160 mmHg.  Okay to travel to  [place]  in [place] with exercising caution.   Meds ordered this encounter  Medications   hydrALAZINE  (APRESOLINE ) 100 MG tablet    Sig: Take 1 tablet (100 mg total) by mouth 2 (two) times daily.    Dispense:  180 tablet    Refill:  3   hydrALAZINE  (APRESOLINE ) 50 MG tablet    Sig: Take 1 tablet at noon is systolic blood pressure is greater than 150    Dispense:  90 tablet    Refill:  3     F/u in 11/2024   Signed, Newman JINNY Lawrence, MD

## 2024-07-20 NOTE — Patient Instructions (Addendum)
 Medication Instructions:  INCREASE Hydralazine  to 100 mg twice a day ADD Hydralazine  50 mg Take 1 tablet at noon is systolic blood pressure is greater than 150  *If you need a refill on your cardiac medications before your next appointment, please call your pharmacy*  Follow-Up: At [facility], you and your health needs are our priority.  As part of our continuing mission to provide you with exceptional heart care, our providers are all part of one team.  This team includes your primary Cardiologist (physician) and Advanced Practice Providers or APPs (Physician Assistants and Nurse Practitioners) who all work together to provide you with the care you need, when you need it.  Your next appointment:   March 2026  Provider:   Dr. Elmira

## 2024-07-27 ENCOUNTER — Other Ambulatory Visit (HOSPITAL_COMMUNITY): Payer: Self-pay

## 2024-10-02 ENCOUNTER — Other Ambulatory Visit: Payer: Self-pay | Admitting: Cardiology

## 2024-10-02 DIAGNOSIS — I10 Essential (primary) hypertension: Secondary | ICD-10-CM

## 2024-10-20 ENCOUNTER — Other Ambulatory Visit (HOSPITAL_COMMUNITY): Payer: Self-pay

## 2024-10-20 ENCOUNTER — Other Ambulatory Visit: Payer: Self-pay | Admitting: Cardiology

## 2024-10-20 DIAGNOSIS — E7800 Pure hypercholesterolemia, unspecified: Secondary | ICD-10-CM

## 2024-10-22 NOTE — Telephone Encounter (Signed)
 In accordance with refill protocols, please review and address the following requirements before this medication refill can be authorized:  Labs   Lipid Panel within 12 months   30 day sent

## 2024-12-03 ENCOUNTER — Ambulatory Visit: Admitting: Cardiology

## 2025-01-08 ENCOUNTER — Encounter (INDEPENDENT_AMBULATORY_CARE_PROVIDER_SITE_OTHER): Admitting: Ophthalmology
